# Patient Record
Sex: Female | Born: 1945
Health system: Southern US, Community
[De-identification: ages and names within clinical notes are randomized; demographics above are authoritative.]

## PROBLEM LIST (undated history)

## (undated) DIAGNOSIS — J449 Chronic obstructive pulmonary disease, unspecified: Secondary | ICD-10-CM

## (undated) HISTORY — PX: BACK SURGERY: SHX140

## (undated) HISTORY — PX: BREAST SURGERY: SHX581

## (undated) HISTORY — PX: HAND SURGERY: SHX662

## (undated) HISTORY — PX: AUGMENTATION MAMMAPLASTY: SUR837

## (undated) HISTORY — PX: ABDOMINAL HYSTERECTOMY: SHX81

## (undated) HISTORY — PX: CATARACT EXTRACTION: SUR2

## (undated) HISTORY — PX: BREAST ENHANCEMENT SURGERY: SHX7

## (undated) HISTORY — PX: BREAST EXCISIONAL BIOPSY: SUR124

---

## 1998-11-14 ENCOUNTER — Other Ambulatory Visit: Admission: RE | Admit: 1998-11-14 | Discharge: 1998-11-14 | Payer: Self-pay | Admitting: Family Medicine

## 1999-05-27 ENCOUNTER — Encounter: Admission: RE | Admit: 1999-05-27 | Discharge: 1999-05-27 | Payer: Self-pay | Admitting: Family Medicine

## 1999-05-27 ENCOUNTER — Encounter: Payer: Self-pay | Admitting: Family Medicine

## 1999-12-03 ENCOUNTER — Encounter: Admission: RE | Admit: 1999-12-03 | Discharge: 1999-12-03 | Payer: Self-pay | Admitting: Family Medicine

## 1999-12-03 ENCOUNTER — Encounter: Payer: Self-pay | Admitting: Family Medicine

## 1999-12-26 ENCOUNTER — Ambulatory Visit (HOSPITAL_COMMUNITY): Admission: RE | Admit: 1999-12-26 | Discharge: 1999-12-27 | Payer: Self-pay | Admitting: Neurosurgery

## 2000-01-10 ENCOUNTER — Encounter: Admission: RE | Admit: 2000-01-10 | Discharge: 2000-01-10 | Payer: Self-pay | Admitting: Neurosurgery

## 2000-03-10 ENCOUNTER — Encounter: Admission: RE | Admit: 2000-03-10 | Discharge: 2000-03-10 | Payer: Self-pay | Admitting: Neurosurgery

## 2000-03-15 ENCOUNTER — Ambulatory Visit (HOSPITAL_COMMUNITY): Admission: RE | Admit: 2000-03-15 | Discharge: 2000-03-15 | Payer: Self-pay | Admitting: Neurosurgery

## 2000-05-12 HISTORY — PX: NECK SURGERY: SHX720

## 2000-06-02 ENCOUNTER — Encounter: Admission: RE | Admit: 2000-06-02 | Discharge: 2000-06-02 | Payer: Self-pay | Admitting: Neurosurgery

## 2000-06-19 ENCOUNTER — Encounter: Admission: RE | Admit: 2000-06-19 | Discharge: 2000-06-19 | Payer: Self-pay | Admitting: Family Medicine

## 2000-06-19 ENCOUNTER — Encounter: Payer: Self-pay | Admitting: Family Medicine

## 2000-08-04 ENCOUNTER — Encounter: Admission: RE | Admit: 2000-08-04 | Discharge: 2000-08-04 | Payer: Self-pay | Admitting: Neurosurgery

## 2000-10-30 ENCOUNTER — Encounter: Admission: RE | Admit: 2000-10-30 | Discharge: 2000-10-30 | Payer: Self-pay | Admitting: Neurosurgery

## 2000-11-26 ENCOUNTER — Other Ambulatory Visit: Admission: RE | Admit: 2000-11-26 | Discharge: 2000-11-26 | Payer: Self-pay | Admitting: Family Medicine

## 2001-05-07 ENCOUNTER — Emergency Department (HOSPITAL_COMMUNITY): Admission: EM | Admit: 2001-05-07 | Discharge: 2001-05-07 | Payer: Self-pay | Admitting: Emergency Medicine

## 2001-10-22 ENCOUNTER — Encounter: Payer: Self-pay | Admitting: Family Medicine

## 2001-10-22 ENCOUNTER — Encounter: Admission: RE | Admit: 2001-10-22 | Discharge: 2001-10-22 | Payer: Self-pay | Admitting: Family Medicine

## 2002-11-01 ENCOUNTER — Encounter: Admission: RE | Admit: 2002-11-01 | Discharge: 2002-11-01 | Payer: Self-pay | Admitting: Family Medicine

## 2002-11-01 ENCOUNTER — Encounter: Payer: Self-pay | Admitting: Family Medicine

## 2003-06-16 ENCOUNTER — Encounter: Admission: RE | Admit: 2003-06-16 | Discharge: 2003-06-16 | Payer: Self-pay | Admitting: Family Medicine

## 2003-06-29 ENCOUNTER — Encounter: Admission: RE | Admit: 2003-06-29 | Discharge: 2003-06-29 | Payer: Self-pay | Admitting: Orthopedic Surgery

## 2003-09-13 ENCOUNTER — Ambulatory Visit (HOSPITAL_BASED_OUTPATIENT_CLINIC_OR_DEPARTMENT_OTHER): Admission: RE | Admit: 2003-09-13 | Discharge: 2003-09-13 | Payer: Self-pay | Admitting: Orthopedic Surgery

## 2004-11-28 ENCOUNTER — Ambulatory Visit (HOSPITAL_BASED_OUTPATIENT_CLINIC_OR_DEPARTMENT_OTHER): Admission: RE | Admit: 2004-11-28 | Discharge: 2004-11-28 | Payer: Self-pay | Admitting: Orthopedic Surgery

## 2004-11-28 ENCOUNTER — Ambulatory Visit (HOSPITAL_COMMUNITY): Admission: RE | Admit: 2004-11-28 | Discharge: 2004-11-28 | Payer: Self-pay | Admitting: Orthopedic Surgery

## 2004-12-17 ENCOUNTER — Encounter: Payer: Self-pay | Admitting: Family Medicine

## 2006-09-23 ENCOUNTER — Telehealth: Payer: Self-pay | Admitting: Family Medicine

## 2006-09-23 ENCOUNTER — Ambulatory Visit: Payer: Self-pay | Admitting: Family Medicine

## 2006-09-23 DIAGNOSIS — G4700 Insomnia, unspecified: Secondary | ICD-10-CM | POA: Insufficient documentation

## 2006-09-23 DIAGNOSIS — F329 Major depressive disorder, single episode, unspecified: Secondary | ICD-10-CM

## 2006-09-23 DIAGNOSIS — E785 Hyperlipidemia, unspecified: Secondary | ICD-10-CM

## 2006-09-23 LAB — CONVERTED CEMR LAB
ALT: 15 units/L (ref 0–35)
Alkaline Phosphatase: 94 units/L (ref 39–117)
Chloride: 105 meq/L (ref 96–112)
Creatinine, Ser: 1.32 mg/dL — ABNORMAL HIGH (ref 0.40–1.20)
Glucose, Bld: 75 mg/dL (ref 70–99)
LDL Cholesterol: 134 mg/dL — ABNORMAL HIGH (ref 0–99)
Total Bilirubin: 0.7 mg/dL (ref 0.3–1.2)
Total Protein: 7.9 g/dL (ref 6.0–8.3)
Triglycerides: 134 mg/dL (ref <150)

## 2006-09-24 ENCOUNTER — Encounter: Payer: Self-pay | Admitting: Family Medicine

## 2006-09-30 ENCOUNTER — Encounter: Admission: RE | Admit: 2006-09-30 | Discharge: 2006-09-30 | Payer: Self-pay | Admitting: Family Medicine

## 2007-01-15 ENCOUNTER — Ambulatory Visit: Payer: Self-pay | Admitting: Family Medicine

## 2007-01-15 DIAGNOSIS — J069 Acute upper respiratory infection, unspecified: Secondary | ICD-10-CM | POA: Insufficient documentation

## 2007-03-31 ENCOUNTER — Ambulatory Visit: Payer: Self-pay | Admitting: Family Medicine

## 2007-09-02 ENCOUNTER — Ambulatory Visit: Payer: Self-pay | Admitting: Family Medicine

## 2007-09-02 ENCOUNTER — Encounter: Admission: RE | Admit: 2007-09-02 | Discharge: 2007-09-02 | Payer: Self-pay | Admitting: Family Medicine

## 2007-09-02 DIAGNOSIS — R944 Abnormal results of kidney function studies: Secondary | ICD-10-CM | POA: Insufficient documentation

## 2007-09-02 DIAGNOSIS — N951 Menopausal and female climacteric states: Secondary | ICD-10-CM | POA: Insufficient documentation

## 2007-09-02 LAB — CONVERTED CEMR LAB: Cholesterol, target level: 200 mg/dL

## 2007-09-03 ENCOUNTER — Encounter: Payer: Self-pay | Admitting: Family Medicine

## 2007-09-03 LAB — CONVERTED CEMR LAB
ALT: 17 units/L (ref 0–35)
Albumin: 4.5 g/dL (ref 3.5–5.2)
CO2: 22 meq/L (ref 19–32)
Calcium: 9.9 mg/dL (ref 8.4–10.5)
Chloride: 105 meq/L (ref 96–112)
Creatinine, Ser: 1.09 mg/dL (ref 0.40–1.20)
Glucose, Bld: 94 mg/dL (ref 70–99)
HDL: 43 mg/dL (ref >39)
LDL Cholesterol: 115 mg/dL — ABNORMAL HIGH (ref 0–99)
Potassium: 4.7 meq/L (ref 3.5–5.3)
Sodium: 141 meq/L (ref 135–145)
TSH: 1.61 microintl units/mL (ref 0.350–5.50)
Total Protein: 7.3 g/dL (ref 6.0–8.3)

## 2008-02-24 ENCOUNTER — Ambulatory Visit: Payer: Self-pay | Admitting: Family Medicine

## 2008-08-30 ENCOUNTER — Ambulatory Visit: Payer: Self-pay | Admitting: Family Medicine

## 2008-10-25 ENCOUNTER — Telehealth: Payer: Self-pay | Admitting: Family Medicine

## 2008-10-25 ENCOUNTER — Encounter: Payer: Self-pay | Admitting: Family Medicine

## 2008-10-26 DIAGNOSIS — N183 Chronic kidney disease, stage 3 (moderate): Secondary | ICD-10-CM

## 2008-10-26 LAB — CONVERTED CEMR LAB
ALT: 11 units/L (ref 0–35)
AST: 13 units/L (ref 0–37)
Alkaline Phosphatase: 98 units/L (ref 39–117)
BUN: 15 mg/dL (ref 6–23)
CO2: 20 meq/L (ref 19–32)
Chloride: 108 meq/L (ref 96–112)
Creatinine, Ser: 1.21 mg/dL — ABNORMAL HIGH (ref 0.40–1.20)
HDL: 47 mg/dL (ref >39)
LDL Cholesterol: 123 mg/dL — ABNORMAL HIGH (ref 0–99)
Potassium: 4.2 meq/L (ref 3.5–5.3)
Sodium: 141 meq/L (ref 135–145)
TSH: 1.505 microintl units/mL (ref 0.350–4.500)
Total Bilirubin: 0.5 mg/dL (ref 0.3–1.2)
Total CHOL/HDL Ratio: 4.4
Total Protein: 7.2 g/dL (ref 6.0–8.3)
VLDL: 37 mg/dL (ref 0–40)

## 2008-10-27 ENCOUNTER — Encounter: Admission: RE | Admit: 2008-10-27 | Discharge: 2008-10-27 | Payer: Self-pay | Admitting: Family Medicine

## 2008-11-30 ENCOUNTER — Telehealth: Payer: Self-pay | Admitting: Family Medicine

## 2008-12-11 ENCOUNTER — Encounter: Admission: RE | Admit: 2008-12-11 | Discharge: 2008-12-11 | Payer: Self-pay | Admitting: Family Medicine

## 2009-05-18 ENCOUNTER — Ambulatory Visit: Payer: Self-pay | Admitting: Family Medicine

## 2009-12-03 ENCOUNTER — Ambulatory Visit: Payer: Self-pay | Admitting: Family Medicine

## 2009-12-03 LAB — CONVERTED CEMR LAB
Albumin: 4.8 g/dL (ref 3.5–5.2)
Creatinine, Ser: 1.31 mg/dL — ABNORMAL HIGH (ref 0.40–1.20)
Potassium: 5.1 meq/L (ref 3.5–5.3)
Total Bilirubin: 0.7 mg/dL (ref 0.3–1.2)
Triglycerides: 164 mg/dL — ABNORMAL HIGH (ref <150)
VLDL: 33 mg/dL (ref 0–40)

## 2009-12-07 ENCOUNTER — Encounter: Admission: RE | Admit: 2009-12-07 | Discharge: 2009-12-07 | Payer: Self-pay | Admitting: Family Medicine

## 2009-12-18 ENCOUNTER — Encounter: Admission: RE | Admit: 2009-12-18 | Discharge: 2009-12-18 | Payer: Self-pay | Admitting: Family Medicine

## 2009-12-20 LAB — HM MAMMOGRAPHY: HM Mammogram: NEGATIVE

## 2010-02-21 ENCOUNTER — Ambulatory Visit: Payer: Self-pay | Admitting: Family Medicine

## 2010-06-13 NOTE — Assessment & Plan Note (Signed)
Summary: f/u on meds   Vital Signs:  Patient profile:   65 year old female Height:      62 inches Weight:      140 pounds BMI:     25.70 Pulse rate:   76 / minute BP sitting:   91 / 58  (left arm) Cuff size:   regular  Vitals Entered By: Avon Gully CMA, Duncan Dull) (December 03, 2009 8:20 AM) CC: f/u meds;labs   Primary Care Provider:  Linford Arnold, C  CC:  f/u meds;labs.  History of Present Illness: Would like to continue the wellbutrin.  Would like it written two times a day for insurance and cost reasons. Otherwise she is doing well on it. No side effects.  Says tried to callto get this straightened out but didn't get a response. She is due for lipid labs as well.  Taking her statin regularly and tolerating it well with no myalgias.   Current Medications (verified): 1)  Pravastatin Sodium 80 Mg Tabs (Pravastatin Sodium) .... Take 1 Tablet By Mouth Once A Day At Bedtime 2)  Budeprion Xl 300 Mg Xr24h-Tab (Bupropion Hcl) .... Take 1 Tablet By Mouth Once A Day 3)  Aspirin 325 Mg Tabs (Aspirin) .... Take One Tablet By Mouth Daily 4)  Prilosec 20 Mg Cpdr (Omeprazole) .... Take One Tablet By Mouth Once A Day 5)  Tylenol Pm Extra Strength 500-25 Mg Tabs (Diphenhydramine-Apap (Sleep)) .... Take 2 Tablets [place] At Bedtime 6)  Calcium 500/d 500-200 Mg-Unit  Tabs (Calcium Carbonate-Vitamin D) .... Take 1 Tablet By Mouth Two Times A Day  Allergies (verified): No Known Drug Allergies  Comments:  Nurse/Medical Assistant: The patient's medications and allergies were reviewed with the patient and were updated in the Medication and Allergy Lists. Avon Gully CMA, Duncan Dull) (December 03, 2009 8:23 AM)  Physical Exam  General:  Well-developed,well-nourished,in no acute distress; alert,appropriate and cooperative throughout examination Head:  Normocephalic and atraumatic without obvious abnormalities. No apparent alopecia or balding. Neck:  No deformities, masses, or tenderness noted. NO  thryomegaly.  Lungs:  Normal respiratory effort, chest expands symmetrically. Lungs are clear to auscultation, no crackles or wheezes. Heart:  Normal rate and regular rhythm. S1 and S2 normal without gallop, murmur, click, rub or other extra sounds.  no carotid bruits.  Skin:  no rashes.   Cervical Nodes:  No lymphadenopathy noted Psych:  Cognition and judgment appear intact. Alert and cooperative with normal attention span and concentration. No apparent delusions, illusions, hallucinations   Impression & Recommendations:  Problem # 1:  HYPERLIPIDEMIA NEC/NOS (ICD-272.4) Doing well. Due to recheck lipids to make sure at goal.  Will refill. Continue to monitor diet and encourage regular activity.  Her updated medication list for this problem includes:    Pravastatin Sodium 80 Mg Tabs (Pravastatin sodium) .Marland Kitchen... Take 1 tablet by mouth once a day at bedtime  Orders: T-Comprehensive Metabolic Panel [id number]) T-Lipid Profile ([id number])  Problem # 2:  CHRONIC KIDNEY DISEASE STAGE III (MODERATE) (ICD-585.3) Need to f/u right cyst. Will schedule for an [place]. and check Cr today.   Problem # 3:  DISORDER, DEPRESSIVE NEC (ICD-311) Will  change to two times a day for cost reasons but she is actually taking once a day and is well controlled.  Her updated medication list for this problem includes:    Budeprion Xl 300 Mg Xr24h-tab (Bupropion hcl) .Marland Kitchen... Take 1 tablet by mouth two times a day  Complete Medication List: 1)  Pravastatin Sodium 80 Mg Tabs (Pravastatin  sodium) .... Take 1 tablet by mouth once a day at bedtime 2)  Budeprion Xl 300 Mg Xr24h-tab (Bupropion hcl) .... Take 1 tablet by mouth two times a day 3)  Aspirin 325 Mg Tabs (Aspirin) .... Take one tablet by mouth daily 4)  Prilosec 20 Mg Cpdr (Omeprazole) .... Take one tablet by mouth once a day 5)  Tylenol Pm Extra Strength 500-25 Mg Tabs (Diphenhydramine-apap (sleep)) .... Take 2 tablets bymouth at bedtime 6)  Calcium 500/d  500-200 Mg-unit Tabs (Calcium carbonate-vitamin d) .... Take 1 tablet by mouth two times a day  Other Orders: T-*Unlisted Diagnostic X-ray test/procedure (61607)  Patient Instructions: 1)  Please schedule a follow-up appointment in 1 year.  2)  We will schedule your kidney ultrasound and call you with the appointment.  Prescriptions: PRAVASTATIN SODIUM 80 MG TABS (PRAVASTATIN SODIUM) Take 1 tablet by mouth once a day at bedtime  #90 x 3   Entered and Authorized by:   Nani Gasser MD   Signed by:   Nani Gasser MD on 12/03/2009   Method used:   Printed then faxed to ...       [facility] (retail)       [address]       [place], [place]  [id number]       Ph: [phone]       Fax: [phone]   RxID:   [id number] BUDEPRION XL 300 MG XR24H-TAB (BUPROPION HCL) Take 1 tablet by mouth two times a day  #180 x 3   Entered and Authorized by:   Nani Gasser MD   Signed by:   Nani Gasser MD on 12/03/2009   Method used:   Printed then faxed to ...       [facility] (retail)       [address]       [place], [place]  [address]       Ph: [phone]       Fax: [phone]   RxID:   [id number]

## 2010-06-13 NOTE — Assessment & Plan Note (Signed)
Summary: flu shot  Nurse Visit   Allergies: No Known Drug Allergies  Immunizations Administered:  Influenza Vaccine # 1:    Vaccine Type: Fluvax MCR    Site: left deltoid    Mfr: GlaxoSmithKline    Dose: 0.5 ml    Route: IM    Given by: Sue Lush McCrimmon CMA, (AAMA)    Exp. Date: 11/09/2010    Lot #: [id number]    VIS given: 12/04/09 version given February 21, 2010.  Flu Vaccine Consent Questions:    Do you have a history of severe allergic reactions to this vaccine? no    Any prior history of allergic reactions to egg and/or gelatin? no    Do you have a sensitivity to the preservative Thimersol? no    Do you have a past history of Guillan-Barre Syndrome? no    Do you currently have an acute febrile illness? no    Have you ever had a severe reaction to latex? no    Vaccine information given and explained to patient? no    Are you currently pregnant? no  Orders Added: 1)  Influenza Vaccine MCR [00025]

## 2010-06-13 NOTE — Letter (Signed)
Summary: [facility]   Imported By: Maryln Gottron 11/01/2009 10:17:47  _____________________________________________________________________  External Attachment:    Type:   Image     Comment:   External Document

## 2010-06-13 NOTE — Assessment & Plan Note (Signed)
Summary: FLU SHOT  Nurse Visit   Vitals Entered By: Payton Spark CMA (May 18, 2009 1:44 PM)  Allergies: No Known Drug Allergies  Orders Added: 1)  Flu Vaccine 75yrs + [90658] 2)  Administration Flu vaccine - MCR [G0008] Flu Vaccine Consent Questions     Do you have a history of severe allergic reactions to this vaccine? no    Any prior history of allergic reactions to egg and/or gelatin? no    Do you have a sensitivity to the preservative Thimersol? no    Do you have a past history of Guillan-Barre Syndrome? no    Do you currently have an acute febrile illness? no    Have you ever had a severe reaction to latex? no    Vaccine information given and explained to patient? yes    Are you currently pregnant? no    Lot Number:[id number]   Exp Date:11/08/2009   Site Given  Left Deltoid IMmedflu

## 2010-09-27 NOTE — Op Note (Signed)
Victoria Benjamin, Victoria Benjamin                       ACCOUNT NO.:  [id number]   MEDICAL RECORD NO.:  [id number]                   PATIENT TYPE:  AMB   LOCATION:  DSC                                  FACILITY:  [facility]   PHYSICIAN:  Cindee Salt, M.D.                    DATE OF BIRTH:  [birth year]   DATE OF PROCEDURE:  09/13/2003  DATE OF DISCHARGE:                                 OPERATIVE REPORT   PREOPERATIVE DIAGNOSIS:  Carpal tunnel syndrome, ulnar nerve palsy, right  arm.   POSTOPERATIVE DIAGNOSIS:  Carpal tunnel syndrome, ulnar nerve palsy, right  arm.   OPERATION:  Decompression right median nerve, subcutaneous transposition  right ulnar nerve.   SURGEON:  Cindee Salt, M.D.   ASSISTANTCarolyne Fiscal   ANESTHESIA:  General.   HISTORY:  The patient is a 65 year old female with a history of carpal  tunnel syndrome and changes in the ulnar nerve at her elbow.  This has not  responded to conservative treatment.   DESCRIPTION OF PROCEDURE:  The patient was brought to the operating room  where a general anesthetic was carried out without difficulty.  She was  prepped and draped using DuraPrep in a supine position with the right arm  free.  A longitudinal incision was made in the palm after exsanguination of  the limb and elevation of a tourniquet to 250 mmHg.  Exsanguination was with  an Esmarch bandage.  The longitudinal incision was then deepened.  The  palmar fascia was split.  The superficial palmar arch was identified.  The  flexor tendon to the ring and little finger was identified.  To the ulnar  side of the median nerve, the carpal retinaculum was incised with sharp  dissection.  A right angle and and Sewell retractor were placed between the  skin and forearm fascia.  The fascia was released for approximately 3 cm  proximal to the wrist crease under direct vision.  The canal was explored.  No further lesions were identified.  The tenosynovial tissue was thickened  and a hyperemic  area to the nerve was identified.  The wound was irrigated.  The skin was closed with interrupted 5-0 nylon sutures.  A separate incision  was then made over the medial condyle and carried down through the  subcutaneous tissue.  The bleeders were again electrocauterized.  Dissection  was carried down to the medial epicondyle, the medial and the brachial  cutaneous nerve.  The brachial cutaneous nerve was identified and protected.  The ulnar nerve was identified with blunt and sharp dissection, this was  dissected free up to the arcade of Struthers.  The intermuscular septum was  then released from the humerus, left attached to the most medial aspect of  the medial epicondyle.  A fasciotomy was then performed to the flexor carpi  ulnaris and a portion of fascia was then elevated leaving it attached to the  epicondyle.  The ulnar nerve was then transposed anteriorly protecting the  branches and maintaining its vascular structure.  This was then placed  anterior to the epicondyle, the wound irrigated, and the two slings were  then sutured into position to maintain it anterior to the epicondyle.  The  suturing was done with figure-of-eight 4-0 Vicryl sutures.  The subcutaneous  tissue was then closed with interrupted 4-0 Vicryl and the skin with a  subcuticular 4-0 Monocryl suture.  Steri-Strips were applied.  A sterile  compressive dressing and long arm splint were applied.  On flexion and  extension prior to closure, there was no kinking of the nerve and no areas  of compression.  The patient was taken to the recovery room for observation  in satisfactory condition.  She is discharged home to return to the [facility] in one week on Talwin and Keflex.                                               Cindee Salt, M.D.    [name]  D:  09/13/2003  T:  09/13/2003  Job:  [id number]

## 2010-09-27 NOTE — Op Note (Signed)
[place]. [facility]  Patient:    Victoria Benjamin, Victoria Benjamin                    MRN: [id number] Proc. Date: 12/26/99 Adm. Date:  [id number] Attending:  Tressie Stalker D                           Operative Report  BRIEF HISTORY:  The patient is 65 year old white female who suffers from neck and left greater, than right arm pain.  Failed medical management.  She was worked up as an outpatient with a cervical MI that demonstrated C5-6 and C6-7 degenerative disease with herniated nucleus pulposus, spondylosis, spinal stenosis.  The patient therefore weighed the risks, benefits, alternatives to surgery and decided to proceed with a 2 level anterior cervical diskectomy, fusion and plating.  PREOPERATIVE DIAGNOSES: 1. C5-6 and C6-7 degenerative disease. 2. Herniated nucleus pulposus. 3. Spondylosis. 4. Spinal stenosis.  POSTOPERATIVE DIAGNOSIS: 1. C5-6 and C6-7 degenerative disease. 2. Herniated nucleus pulposus. 3. Spondylosis. 4. Spinal stenosis.  PROCEDURE: 1. C5-6 and C6-7 anterior cervical diskectomy. 2. Interbody iliac crest allograft arthrodesis. 3. Anterior cervical plating C5 to C7 using Codman anterior cervical plate    and a 12 mm screw.  SURGEON:  Cristi Loron, M.D.  ASSISTANT:  Stefani Dama, M.D.  ANESTHESIA:  General endotracheal.  ESTIMATED BLOOD LOSS:  200 cc.  SPECIMENS:  None.  DRAINS:  None.  COMPLICATIONS:  None.  DESCRIPTION OF PROCEDURE:  The patient was brought to the operating room by the anesthesia team.  General endotracheal anesthesia was induced.  The patient remained in the supine position.  A roll was placed under her shoulders to place her neck in slight extension.  Her anterior cervical region was then prepared with Betadine scrub and Betadine solution.  Sterile drapes were applied.  I then injected the area to be incised with Marcaine with epinephrine solution.  I used the scalpel to make a left-sided  transverse incision in one of the patients preexisting skin folds.  I then used Metzenbaum scissors to dissect down to the platysma muscle.  I divided along the direction of skin incision and then dissected medial to the sternocleidomastoid muscle, jugular vein and carotid artery.  I dissected bluntly down to the anterior cervical spine, identified the esophagus, and carefully retracted medially.  I cleared the soft tissue from the anterior cervical spine using Kitner swabs and inserted a bent spinal needle into the exposed interspace and obtained an intraoperative radiograph to confirm my location.  I then used electrocautery to detach the longus coli muscle bilaterally from the C5-6 and C6-7 intervertebral disk space.  I inserted the Caspar self-retaining retractor for exposure.  I then used the 15 blade scalpel to incise the C5-6 intervertebral disc.  I performed a partial discectomy using the pituitary forceps.  I inserted distraction screws at C5 and C6 to distract the interspace.  I then used a Midas Rex high speed drill to drill away the remainder of the intervertebral disk and decorticate the vertebral end plates at J4-7.  Posteriorly there was a very large osteophyte coming from the colloids of the C5 vertebral body on the left compressing the left C6 nerve root.  I drilled away with a drill.  I thinned out the posterior longitudinal ligament, incised it with an arachnoid knife, and then removed it with the Kerrison punch under______  the vertebral end plates at W2-9 and  identified the bilateral C6 nerve root, and performed a generous foraminotomy about it bilaterally.  I then palpated about the thecal sac and the bilateral C6 nerve root and noted that neural structures were well decompressed.  I then repeated this procedure at C6-7 i.e. I incised the C6-7 disk and performed a partial diskectomy with pituitary forceps.  Put a distraction screw in C7, distracted the C6-7  interspace, and then decorticated the vertebral end plates with a Midas Rex high speed drill throwing away the remainder of the intervertebral disk.  I thinned out the posterior longitudinal ligament, incised it with an arachnoid knife, removed it with a Kerrison punch under______ the vertebral end plates of A2-1 and performed a generous foraminotomy bilaterally about the C7 nerve root.  The neural structures were well decompressed at this level.  Having completed the anterior cervical diskectomy I now turned my attention to arthrodesis.  I obtained the iliac crest tricortical allograft bone grafts and fashioned them to its approximate dimensions, 7 mm in height and 1 cm in depth.  I then inserted one bone graft into the distracted C6-7 interspace and then another in the C5-6 interspace.  I removed distraction pins.  There was a good snug fit of the bone graft at both levels.  I drilled away some anterior osteophytes from the anterior aspect of the C5, C6, and C7 vertebral bodies.  Having completed arthrodesis I now turned my attention to anterior spinal instrumentation.  I obtained an appropriate length Codman anterior cervical plate, laid it along the anterior aspect of the vertebral bodies from C5 to C7, drilled two holes at C5, two at C6, and two at C7, tapped these holes, and placed 12 mm screws.  I then obtained an intraoperative radiograph.  It demonstrated good position of the plates, screws, and interbody grafts.  I then secured the screws to the plate using the CAM tightner.  I then copiously irrigated the wound with Bacitracin solution, removed the solution.  Achieved stringent hemostasis with bipolar electrocautery and Gelfoam.  Removed the Caspar self-retaining retractor and inspected the esophagus for any damage and there was none.  I then reapproximated the patients platysma muscle with interrupted 3-0 Vicryl and the subcutaneous with interrupted 3-0 Vicryl and the skin  with Steri-Strips and benzoin.  The wound was then coated with bacitracin ointment.  A sterile dressing applied.  The drapes were removed and  the patient was subsequently extubated by the anesthesia team and transported to the post anesthesia care unit in stable condition.  All sponge, instrument, and needle counts were correct at the end of this case. DD:  12/26/99 TD:  12/27/99 Job: [id number]

## 2010-09-27 NOTE — Op Note (Signed)
[place]. [facility]  Patient:    Victoria Benjamin, Victoria Benjamin                    MRN: [id number] Proc. Date: 12/26/99 Adm. Date:  [id number] Attending:  Tressie Stalker D                           Operative Report  ADDENDUM:  I forgot to mention that I did coagulate one small artery which was crossing across the C6-7 intervertebral disk space, and secured it with two Hemoclips, and then ligated it with the Metzenbaum scissors in order to gain access to the C6-7 intervertebral disk space. DD:  12/26/99 TD:  12/27/99 Job: [id number]

## 2010-09-27 NOTE — H&P (Signed)
[place]. [facility]  Patient:    Victoria Benjamin, PACIFICO                    MRN: [id number] Adm. Date:  [id number] Attending:  Tressie Stalker D                         History and Physical  CHIEF COMPLAINT:  Neck pain, left greater than right arm pain.  HISTORY OF PRESENT ILLNESS:  The patient is a 65 year old white female, who had had an approximately three-month history of neck pain and bilateral arm pain.  She has been treated with various medications by her primary physician, and she has failed to improve.  She then underwent physical therapy and this did not help either.  She was sent for a cervical MRI and then kindly sent for my consultation.  The patient complains of left-sided neck pain which radiates down the left shoulder, down her left arm, with similar pain on the right.  PAST MEDICAL HISTORY:  Positive for endometriosis.  MEDICATIONS:  Pravachol, Estratest, vitamin E, propoxyphene, Celebrex, cyclobenzaprine.  PAST SURGICAL HISTORY:  Hysterectomy in 1984 for endometriosis, breast implants in 1986 and 1992.  FAMILY HISTORY:  The patients mother is age 39, in good health.  The patients father died at age 62 secondary to alcohol abuse and myocardial infarction.  SOCIAL HISTORY:  The patient is married.  She has two daughters.  She lives in [place].  She is employed in Systems [employer].  She has been out of work since November 20, 1999, secondary to her neck and arm pain.  She rarely drinks alcohol, and she denies drug use.  She had been smoking three-quarters of a pack a day of cigarettes for approximately 35 years.  I highly advised her to quit.  REVIEW OF SYSTEMS:  Negative except as above.  PHYSICAL EXAMINATION:  GENERAL:  A pleasant well-nourished, well-developed 65 year old white female complaining of neck and left arm pain.  VITAL SIGNS:  Height 5 feet 2 inches.  Weight 120 pounds.  HEENT:  Normocephalic, atraumatic.  Pupils are equal,  round and reactive to light, extraocular muscles intact.  Sclerae are white.  Conjunctivae are pink. Oropharynx benign.  Uvula midline.  NECK:  Supple.  There are no masses, meningismus, or deformity, tracheal deviation, _____, or carotid bruits.  She has a limited cervical range of motion.  Spurlings test is positive on the left, negative on the right. Lhermittes sign was not present.  CHEST:  Thorax was symmetrical.  LUNGS:  Clear to auscultation.  HEART:  Regular rate and rhythm.  ABDOMEN:  Soft, nontender.  EXTREMITIES:  No obvious deformities.  The patient has a positive Phalens test bilaterally, equivocal Tinels test.  BACK:  Benign.  NEUROLOGIC:  The patient is alert and oriented x 3.  Cranial nerves II-XII are grossly intact bilaterally.  Vision is grossly normal bilaterally with corrective lenses.  Hearing bilaterally grossly normal.  Motor strength is 5/5 in her bilateral deltoid, biceps, triceps, hand grip, wrist extensor, interosseous, psoas, quadriceps, gastrocnemius, extensor hallucis longus. Cerebellar exam is intact to rapid alternating movements in the upper extremities bilaterally.  Sensory exam is somewhat inconsistent, but she appears to have decreased sensation in the radial aspect of her bilateral hands, thumb, and index finger.  Deep tendon reflexes are 2/4 in bilateral biceps, triceps, brachioradialis, quadriceps, gastrocnemius.  She has bilateral flexor plantar reflexes, no ankle clonus.  IMAGING STUDIES:  The  patient had a cervical MRI performed at [facility] on December 03, 1999.  The patient has degenerative disease and spondylosis, herniated nucleus pulposus at C5-6 on the left with compression of the left C6 nerve root.  The patient has diffuse bulging of the _____ at C6-7 with left neural foraminal stenosis.  ASSESSMENT AND PLAN:  C5-6 and C6-7 degenerative disease, herniated nucleus pulposus, spondylosis, spinal stenosis.  I discussed the situation with  the patient and reviewed her MRI scan with her and pointed out the abnormalities. I have discussed the risks and treatment options, including a C5-6 and C6-7 anterior cervical diskectomy and fusion and plating.  I have described the surgical procedure to her and discussed the risk of surgery extensively.  The patient has weighed the risks, benefits, and alternatives of surgery and desires to proceed with the operation on December 26, 1999. DD:  12/26/99 TD:  12/27/99 Job: [id number]

## 2010-09-27 NOTE — Op Note (Signed)
Victoria Benjamin, Victoria Benjamin             ACCOUNT NO.:  [id number]   MEDICAL RECORD NO.:  [id number]          PATIENT TYPE:  AMB   LOCATION:  DSC                          FACILITY:  [facility]   PHYSICIAN:  Cindee Salt, M.D.       DATE OF BIRTH:  October 12, 1945   DATE OF PROCEDURE:  11/28/2004  DATE OF DISCHARGE:                                 OPERATIVE REPORT   PREOPERATIVE DIAGNOSIS:  Stenosing tenosynovitis, right thumb.   POSTOPERATIVE DIAGNOSIS:  Stenosing tenosynovitis, right thumb.   OPERATION:  Release A1 pulley, right thumb.   SURGEON:  Cindee Salt, M.D.   ASSISTANT:  Carolyne Fiscal R.N.   ANESTHESIA:  Forearm based IV regional.   HISTORY:  The patient is a 65 year old female with a history of a locked  trigger thumb, right thumb. This has not responded to conservative  treatment.   PROCEDURE:  The patient is brought to the operating room where forearm based  IV regional anesthetic was carried out without difficulty. She was prepped  using DuraPrep, supine position, right arm free. A transverse incision was  made over the A1 pulley, carried down through subcutaneous tissue. Bleeders  were electrocauterized. Neurovascular structures identified and protected.  Retractors placed. The A1 pulley was found be markedly thickened. This was  isolated. An incision was made through the A1 pulley on the radial aspect  protecting the oblique pulley.  The thumb was easily able to be  unlocked at  that point, placed through full range motion, no further triggering was  evident. The wound was irrigated. Skin was closed with interrupted 5-0 nylon  sutures. A sterile compressive dressing was applied. The patient tolerated  the procedure well was taken to the recovery observation in satisfactory  condition. She is discharged home to return to the [facility]  in one week on [place] [facility].       GK/MEDQ  D:  11/28/2004  T:  11/28/2004  Job:  [id number]

## 2010-10-15 ENCOUNTER — Telehealth: Payer: Self-pay | Admitting: Family Medicine

## 2010-10-15 ENCOUNTER — Ambulatory Visit (INDEPENDENT_AMBULATORY_CARE_PROVIDER_SITE_OTHER): Payer: Medicare Other | Admitting: Family Medicine

## 2010-10-15 VITALS — BP 118/78 | HR 105 | Temp 98.8°F | Ht 61.5 in | Wt 142.0 lb

## 2010-10-15 DIAGNOSIS — N760 Acute vaginitis: Secondary | ICD-10-CM

## 2010-10-15 DIAGNOSIS — R109 Unspecified abdominal pain: Secondary | ICD-10-CM

## 2010-10-15 LAB — WET PREP FOR TRICH, YEAST, CLUE: Yeast Wet Prep HPF POC: NONE SEEN

## 2010-10-15 LAB — POCT URINALYSIS DIPSTICK
Bilirubin, UA: NEGATIVE
Ketones, UA: NEGATIVE
Spec Grav, UA: 1.01
Urobilinogen, UA: 0.2
pH, UA: 7

## 2010-10-15 NOTE — Progress Notes (Signed)
  Subjective:    Patient ID: Victoria Benjamin, female    DOB: 05/21/1945, 65 y.o.   MRN: [id number]  HPI She has a raw feeling in her vaginal area. Sxs for 2 weeks.  No abnormal d/c, no fever. No pelvic pressure.  No inc in urinary frequency. No rash. +dysuria.  No creams or douches or powders. Doesn't use condoms. Not sexually active. No nausea or vomiting. No bleeding.    Review of Systems     Objective:   Physical Exam  Constitutional: She appears well-developed and well-nourished.   Pt declined pelvic exam.       Assessment & Plan:  Vaginitis - Will send wet prep.  UA is neg today. Will send urine culture.  She declined pelvic exam. If wet prep neg then will need exam.

## 2010-10-15 NOTE — Patient Instructions (Signed)
We will call you with the lab results. 

## 2010-10-15 NOTE — Telephone Encounter (Signed)
Call patient: Wet prep is negative. She continues to have symptoms then we really need to do a actual vaginal exam.

## 2010-10-17 ENCOUNTER — Telehealth: Payer: Self-pay | Admitting: Family Medicine

## 2010-10-17 NOTE — Telephone Encounter (Signed)
Call pt: Urine culture is negative. Wet prep is negative. If She continues to have symptoms then we really need to do a actual vaginal exam

## 2010-10-17 NOTE — Telephone Encounter (Signed)
Pt aware of the above  

## 2010-10-18 LAB — URINE CULTURE: Colony Count: 10000

## 2010-10-24 ENCOUNTER — Other Ambulatory Visit: Payer: Self-pay | Admitting: Family Medicine

## 2010-10-24 MED ORDER — PRAVASTATIN SODIUM 80 MG PO TABS: 80.0000 mg | ORAL_TABLET | Freq: Every day | ORAL | Status: DC

## 2010-11-16 ENCOUNTER — Encounter: Payer: Self-pay | Admitting: Family Medicine

## 2010-11-19 ENCOUNTER — Encounter: Payer: Self-pay | Admitting: Family Medicine

## 2010-11-19 ENCOUNTER — Ambulatory Visit (INDEPENDENT_AMBULATORY_CARE_PROVIDER_SITE_OTHER): Payer: Medicare Other | Admitting: Family Medicine

## 2010-11-19 DIAGNOSIS — Z1211 Encounter for screening for malignant neoplasm of colon: Secondary | ICD-10-CM

## 2010-11-19 DIAGNOSIS — I10 Essential (primary) hypertension: Secondary | ICD-10-CM

## 2010-11-19 DIAGNOSIS — E785 Hyperlipidemia, unspecified: Secondary | ICD-10-CM

## 2010-11-19 DIAGNOSIS — F329 Major depressive disorder, single episode, unspecified: Secondary | ICD-10-CM

## 2010-11-19 LAB — LIPID PANEL
LDL Cholesterol: 152 mg/dL — ABNORMAL HIGH (ref 0–99)
Triglycerides: 180 mg/dL — ABNORMAL HIGH (ref <150)

## 2010-11-19 MED ORDER — BUPROPION HCL ER (SR) 150 MG PO TB12: 150.0000 mg | ORAL_TABLET | Freq: Two times a day (BID) | ORAL | Status: DC

## 2010-11-19 NOTE — Assessment & Plan Note (Addendum)
Will change to bid dosing to see if more cost effective. Dose is working well to control her mood. Followup in one year since she is well controlled.

## 2010-11-19 NOTE — Progress Notes (Signed)
  Subjective:    Patient ID: Victoria Benjamin, female    DOB: 03-01-1946, 65 y.o.   MRN: [id number]  Hyperlipidemia This is a chronic problem. The problem is controlled. Factors aggravating her hyperlipidemia include no known factors. Current antihyperlipidemic treatment includes statins. There are no compliance problems.  Risk factors for coronary artery disease include no known risk factors.   is tolerating the medications well. No myalgias or side effects.  Depression-she would like to change her Wellbutrin to the short-acting version to see if she could safely. She's been on the 300 X. are since 2008. The last time she tried to fill it cost her over $100. She is happy with her current regimen and doesn't want to change it. She would like to continue taking an antidepressant.  Review of Systems     Objective:   Physical Exam  Constitutional: She is oriented to person, place, and time. She appears well-developed and well-nourished.  Cardiovascular: Normal rate and regular rhythm.   Pulmonary/Chest: Effort normal and breath sounds normal.  Musculoskeletal: She exhibits no edema.  Neurological: She is alert and oriented to person, place, and time.  Skin: Skin is warm and dry.  Psychiatric: She has a normal mood and affect.          Assessment & Plan:  She would like to go ahead and do a referral for colonoscopy. She is well overdue for this. We will have to put in a referral.  I gave her handout on the shingles for her to consider immunization. She's currently on Medicare so if she does decide she wants to do it and we can give her a prescription to take her pharmacy since it is covered under her Medicare part D.  Chronic kidney disease-recheck baseline kidney function.

## 2010-11-19 NOTE — Assessment & Plan Note (Addendum)
Due for labs so given lab slip today. She is fasting. Followup in one year if she is well controlled.

## 2010-11-20 ENCOUNTER — Other Ambulatory Visit: Payer: Self-pay | Admitting: Family Medicine

## 2010-11-20 LAB — COMPLETE METABOLIC PANEL WITH GFR
AST: 20 U/L (ref 0–37)
Albumin: 4.4 g/dL (ref 3.5–5.2)
Alkaline Phosphatase: 79 U/L (ref 39–117)
Calcium: 9.7 mg/dL (ref 8.4–10.5)
Chloride: 106 mEq/L (ref 96–112)
GFR, Est African American: >60 mL/min (ref >60)
Glucose, Bld: 91 mg/dL (ref 70–99)
Total Bilirubin: 0.7 mg/dL (ref 0.3–1.2)

## 2010-11-20 MED ORDER — ROSUVASTATIN CALCIUM 10 MG PO TABS: 10.0000 mg | ORAL_TABLET | Freq: Every day | ORAL | Status: DC

## 2010-11-20 NOTE — Telephone Encounter (Signed)
Pt informed and would like a script to be sent through her [facility]. Routed to Dr. Marlyne Beards, LPN [facility]

## 2010-11-20 NOTE — Telephone Encounter (Signed)
Call patient: Complete metabolic panel is normal. Kidney function actually looks a little bit better at this time. Cholesterol is too high. LDL is 152, goal is 100. Since she started taking pravastatin 80 mg we really need to change her to a different statin which is slowly stronger. I entered the medication. Please ask her if she would like to sent to mail order or if she would like it sent to her local pharmacy first. Please enter the pharmacy and hit sign Thank you

## 2010-11-27 ENCOUNTER — Other Ambulatory Visit: Payer: Self-pay | Admitting: Family Medicine

## 2010-11-27 NOTE — Telephone Encounter (Signed)
This medication for the crestor was sent. Jarvis Newcomer, LPN [facility]

## 2010-11-28 NOTE — Telephone Encounter (Signed)
Closed

## 2010-12-03 ENCOUNTER — Other Ambulatory Visit: Payer: Self-pay | Admitting: *Deleted

## 2010-12-03 MED ORDER — ROSUVASTATIN CALCIUM 10 MG PO TABS: 10.0000 mg | ORAL_TABLET | Freq: Every day | ORAL | Status: DC

## 2010-12-03 MED ORDER — BUPROPION HCL ER (SR) 150 MG PO TB12: 150.0000 mg | ORAL_TABLET | Freq: Two times a day (BID) | ORAL | Status: DC

## 2010-12-03 NOTE — Telephone Encounter (Signed)
Pt called and states meds were sent to the wrong pharm. Per pt need wellbutrin and crestor sent [facility] for 90 day supply and since she out of the crestor send 30 day to [facility] [address] in [place].

## 2010-12-10 ENCOUNTER — Telehealth: Payer: Self-pay | Admitting: Family Medicine

## 2010-12-10 MED ORDER — ATORVASTATIN CALCIUM 80 MG PO TABS: 80.0000 mg | ORAL_TABLET | Freq: Every day | ORAL | Status: DC

## 2010-12-10 NOTE — Telephone Encounter (Signed)
Pt called and wants to know why her chol med was changed.   Plan:  Reviewed the recent labs on pt and because 3 parts of the chol panel were high I explained to pt that this was probably the reason.  I could not find an in basket note or telephone call to tell me pt had been called and informed.  Please advise if I am over looking something.  Pt said crestor cost close to 200.00 and the pravastatin didn't cost her anything. Routed to Dr. Linford Arnold

## 2010-12-10 NOTE — Telephone Encounter (Signed)
Pt called and LMOM for the triage nurse stating she needed to ask some questions to the nurse. Plan:  [facility] for the pt that her call had been returned and she may call the triage nurse back at her convenience. Jarvis Newcomer, LPN [facility]

## 2010-12-10 NOTE — Telephone Encounter (Signed)
I agree that is really weird. I see crestor was rx on 7-11 and then again on 7-24 but usually I send a new med as part of a phone note when I review the lab, but I don't see a phone note etc either.  She does need to switch to something else though. The pravastatin 80mg  was not enough to control her cholesterol.  We can change to lipitor and see if cheaper since it is now generic. Please tell her I apologize for no one contacting her about the medication change.  Then lets recheck her chol level in 8 weeks.

## 2010-12-11 NOTE — Telephone Encounter (Signed)
Pt informed and explanation given as to no telephone note was there in chart file to know whether or not Dr. Intended for pt to be changed to the crestor and Dr. Linford Arnold confirmed this.  We apologized and pt already has the crestor she received and will finish out that medicine and then will call the triage nurse later when closer to running out of the crestor so we can send a script for the lipitor or generic for this medicine.  Pt in the interim will check with her insurance to see if lipitor or its generic will be cheaper copay, and slhe will call back and speak with the triage nurse about this.  Pt knows to call for order to recheck chol levels in 8 wks.  Jarvis Newcomer, LPN [facility]

## 2010-12-12 ENCOUNTER — Other Ambulatory Visit: Payer: Self-pay | Admitting: Family Medicine

## 2010-12-12 DIAGNOSIS — Z1231 Encounter for screening mammogram for malignant neoplasm of breast: Secondary | ICD-10-CM

## 2010-12-24 ENCOUNTER — Ambulatory Visit: Payer: Medicare Other

## 2010-12-24 ENCOUNTER — Ambulatory Visit
Admission: RE | Admit: 2010-12-24 | Discharge: 2010-12-24 | Disposition: A | Discharge status: Home / Self Care | Payer: Medicare Other | Source: Ambulatory Visit | Attending: Family Medicine | Admitting: Family Medicine

## 2010-12-24 DIAGNOSIS — Z1231 Encounter for screening mammogram for malignant neoplasm of breast: Secondary | ICD-10-CM

## 2010-12-26 ENCOUNTER — Telehealth: Payer: Self-pay | Admitting: Family Medicine

## 2010-12-26 NOTE — Telephone Encounter (Signed)
Please call patient. Normal mammogram.  Repeat in 1 year.  

## 2011-01-08 NOTE — Telephone Encounter (Signed)
Left message on vm

## 2011-02-20 ENCOUNTER — Ambulatory Visit (INDEPENDENT_AMBULATORY_CARE_PROVIDER_SITE_OTHER): Payer: Medicare Other | Admitting: Family Medicine

## 2011-02-20 DIAGNOSIS — Z23 Encounter for immunization: Secondary | ICD-10-CM

## 2011-02-20 NOTE — Progress Notes (Signed)
Here for flu shot

## 2011-02-24 ENCOUNTER — Other Ambulatory Visit: Payer: Self-pay | Admitting: Family Medicine

## 2011-02-26 ENCOUNTER — Telehealth: Payer: Self-pay | Admitting: Family Medicine

## 2011-02-26 NOTE — Telephone Encounter (Signed)
Patient called left a voice message that [facility] is requesting a fax from our office about her meds that were changed. Pt left a [facility] fax [phone].

## 2011-02-27 ENCOUNTER — Other Ambulatory Visit: Payer: Self-pay | Admitting: *Deleted

## 2011-02-27 MED ORDER — ATORVASTATIN CALCIUM 80 MG PO TABS: 80.0000 mg | ORAL_TABLET | Freq: Every day | ORAL | Status: DC

## 2011-02-27 MED ORDER — BUPROPION HCL ER (SR) 150 MG PO TB12: 150.0000 mg | ORAL_TABLET | Freq: Two times a day (BID) | ORAL | Status: DC

## 2011-02-27 NOTE — Telephone Encounter (Signed)
Pt states she needs her gen wellbutrin and gen lipitor sent to [facility]

## 2011-02-28 ENCOUNTER — Other Ambulatory Visit: Payer: Self-pay | Admitting: *Deleted

## 2011-02-28 MED ORDER — ATORVASTATIN CALCIUM 80 MG PO TABS: 80.0000 mg | ORAL_TABLET | Freq: Every day | ORAL | Status: DC

## 2011-03-10 ENCOUNTER — Other Ambulatory Visit: Payer: Self-pay | Admitting: *Deleted

## 2011-05-26 ENCOUNTER — Other Ambulatory Visit: Payer: Self-pay | Admitting: *Deleted

## 2011-05-26 MED ORDER — BUPROPION HCL ER (SR) 150 MG PO TB12: 150.0000 mg | ORAL_TABLET | Freq: Two times a day (BID) | ORAL | Status: DC

## 2011-05-26 MED ORDER — ATORVASTATIN CALCIUM 80 MG PO TABS: 80.0000 mg | ORAL_TABLET | Freq: Every day | ORAL | Status: DC

## 2011-05-26 MED ORDER — PRAVASTATIN SODIUM 80 MG PO TABS: 80.0000 mg | ORAL_TABLET | Freq: Every day | ORAL | Status: DC

## 2011-07-17 DIAGNOSIS — L820 Inflamed seborrheic keratosis: Secondary | ICD-10-CM | POA: Diagnosis not present

## 2011-07-17 DIAGNOSIS — D2330 Other benign neoplasm of skin of unspecified part of face: Secondary | ICD-10-CM | POA: Diagnosis not present

## 2011-07-17 DIAGNOSIS — L570 Actinic keratosis: Secondary | ICD-10-CM | POA: Diagnosis not present

## 2011-07-17 DIAGNOSIS — L723 Sebaceous cyst: Secondary | ICD-10-CM | POA: Diagnosis not present

## 2012-02-05 ENCOUNTER — Encounter: Payer: Self-pay | Admitting: Family Medicine

## 2012-02-05 ENCOUNTER — Ambulatory Visit (INDEPENDENT_AMBULATORY_CARE_PROVIDER_SITE_OTHER): Payer: Medicare Other | Admitting: Family Medicine

## 2012-02-05 VITALS — BP 99/62 | HR 76 | Wt 145.0 lb

## 2012-02-05 DIAGNOSIS — K219 Gastro-esophageal reflux disease without esophagitis: Secondary | ICD-10-CM

## 2012-02-05 DIAGNOSIS — R42 Dizziness and giddiness: Secondary | ICD-10-CM

## 2012-02-05 DIAGNOSIS — Z23 Encounter for immunization: Secondary | ICD-10-CM | POA: Diagnosis not present

## 2012-02-05 DIAGNOSIS — F329 Major depressive disorder, single episode, unspecified: Secondary | ICD-10-CM | POA: Diagnosis not present

## 2012-02-05 DIAGNOSIS — E785 Hyperlipidemia, unspecified: Secondary | ICD-10-CM

## 2012-02-05 DIAGNOSIS — E119 Type 2 diabetes mellitus without complications: Secondary | ICD-10-CM | POA: Diagnosis not present

## 2012-02-05 DIAGNOSIS — F3289 Other specified depressive episodes: Secondary | ICD-10-CM | POA: Diagnosis not present

## 2012-02-05 LAB — LIPID PANEL
HDL: 40 mg/dL (ref >39)
Total CHOL/HDL Ratio: 4.9 Ratio
VLDL: 36 mg/dL (ref 0–40)

## 2012-02-05 LAB — COMPLETE METABOLIC PANEL WITH GFR
AST: 22 U/L (ref 0–37)
Alkaline Phosphatase: 89 U/L (ref 39–117)
BUN: 18 mg/dL (ref 6–23)
GFR, Est Non African American: 51 mL/min — ABNORMAL LOW
Glucose, Bld: 85 mg/dL (ref 70–99)
Sodium: 140 mEq/L (ref 135–145)
Total Bilirubin: 0.9 mg/dL (ref 0.3–1.2)
Total Protein: 7.1 g/dL (ref 6.0–8.3)

## 2012-02-05 LAB — POCT UA - MICROALBUMIN: Albumin/Creatinine Ratio, Urine, POC: <30

## 2012-02-05 LAB — POCT GLYCOSYLATED HEMOGLOBIN (HGB A1C): Hemoglobin A1C: 7.2

## 2012-02-05 MED ORDER — PRAVASTATIN SODIUM 80 MG PO TABS: 80.0000 mg | ORAL_TABLET | Freq: Every day | ORAL | Status: DC

## 2012-02-05 MED ORDER — AMBULATORY NON FORMULARY MEDICATION: Status: DC

## 2012-02-05 MED ORDER — BUPROPION HCL ER (SR) 150 MG PO TB12: 150.0000 mg | ORAL_TABLET | Freq: Two times a day (BID) | ORAL | Status: DC

## 2012-02-05 MED ORDER — OMEPRAZOLE 20 MG PO CPDR: 20.0000 mg | DELAYED_RELEASE_CAPSULE | Freq: Every day | ORAL | Status: DC

## 2012-02-05 NOTE — Progress Notes (Signed)
  Subjective:    Patient ID: Victoria Benjamin, female    DOB: [birth year], 66 y.o.   MRN: [id number]  HPI GERD - Out of prilosec for a few weeks and reflux got much worse. Has been using TUMS. Would like a new rx.   Depression - Says struggling a little too. She has felt more down recently. She is taking her Wellbutrin regularly. She does not want to adjust the medication. Her daughter recently moved back in with her and this is actually been very stressful.  Hyperlipidemia - due ot recheck labs. Tolerating her statins well.   Normally her blood pressure does run low. She did have a slightly dizzy episode couple days ago. It was brief but she says it wasn't tense. No change in speech or mentation. Has not happened since.   Review of Systems     Objective:   Physical Exam  Constitutional: She is oriented to person, place, and time. She appears well-developed and well-nourished.  HENT:  Head: Normocephalic and atraumatic.  Neck: Neck supple. No thyromegaly present.  Cardiovascular: Normal rate, regular rhythm and normal heart sounds.   Pulmonary/Chest: Effort normal and breath sounds normal.  Lymphadenopathy:    She has no cervical adenopathy.  Neurological: She is alert and oriented to person, place, and time.  Skin: Skin is warm and dry.  Psychiatric: She has a normal mood and affect. Her behavior is normal.          Assessment & Plan:  GERD- Will refill her omeprazole.  Call if still having symptoms when she is back on the Prilosec. She does report cup of coffee a day but no other caffeine.  Depression - PHQ-9 score of 10, GAD- 7 score or 16.  Says doesn't want to adjust her medication. I asked her please have a low threshold to call me if she feels like her meds getting worse. We can easily bumper Wellbutrin 300 or consider getting her into therapy or counseling. Now she is retired did encourage her to get out and maybe consider a small part-time job or maybe even  volunteering. Her daughter has moved back in with her this has been very stressful for her.  Dizziness-I discussed that since her blood pressure tends to run low normally that she needs to be very careful about staying hydrated and not skipping any meals to avoid any hypoglycemic events. If it happens again then please let me know and we can consider further workup and evaluation. No other symptoms to suggest some type of stroke etiology.  Hyperlipidemia - due to recheck lipids. She will go today. We'll go ahead and refill sent to pharmacy. Adjust if needed.  Flu shot given.   Shingles vaccine rx givne.   Hemoglobin A1c and urine microalbumin ordered in error.

## 2012-02-05 NOTE — Patient Instructions (Addendum)
Diet for GERD or PUD Nutrition therapy can help ease the discomfort of gastroesophageal reflux disease (GERD) and peptic ulcer disease (PUD).  HOME CARE INSTRUCTIONS   Eat your meals slowly, in a relaxed setting.   Eat 5 to 6 small meals per day.   If a food causes distress, stop eating it for a period of time.  FOODS TO AVOID  Coffee, regular or decaffeinated.   Cola beverages, regular or low calorie.   Tea, regular or decaffeinated.   Pepper.   Cocoa.   High fat foods, including meats.   Butter, margarine, hydrogenated oil (trans fats).   Peppermint or spearmint (if you have GERD).   Fruits and vegetables if not tolerated.   Alcohol.   Nicotine (smoking or chewing). This is one of the most potent stimulants to acid production in the gastrointestinal tract.   Any food that seems to aggravate your condition.  If you have questions regarding your diet, ask your caregiver or a registered dietitian. TIPS  Lying flat may make symptoms worse. Keep the head of your bed raised 6 to 9 inches (15 to 23 cm) by using a foam wedge or blocks under the legs of the bed.   Do not lay down until 3 hours after eating a meal.   Daily physical activity may help reduce symptoms.  MAKE SURE YOU:   Understand these instructions.   Will watch your condition.   Will get help right away if you are not doing well or get worse.  Document Released: 04/28/2005 Document Revised: 04/17/2011 Document Reviewed: 03/14/2011 Exit[employer] Patient Information 2012 [employer]. 

## 2012-04-20 DIAGNOSIS — D235 Other benign neoplasm of skin of trunk: Secondary | ICD-10-CM | POA: Diagnosis not present

## 2012-04-20 DIAGNOSIS — L570 Actinic keratosis: Secondary | ICD-10-CM | POA: Diagnosis not present

## 2012-04-20 DIAGNOSIS — D0460 Carcinoma in situ of skin of unspecified upper limb, including shoulder: Secondary | ICD-10-CM | POA: Diagnosis not present

## 2012-06-02 ENCOUNTER — Encounter: Payer: Self-pay | Admitting: Sports Medicine

## 2012-06-02 ENCOUNTER — Ambulatory Visit (INDEPENDENT_AMBULATORY_CARE_PROVIDER_SITE_OTHER): Payer: PRIVATE HEALTH INSURANCE | Admitting: Sports Medicine

## 2012-06-02 VITALS — BP 140/83 | HR 85 | Wt 149.0 lb

## 2012-06-02 DIAGNOSIS — J340 Abscess, furuncle and carbuncle of nose: Secondary | ICD-10-CM | POA: Insufficient documentation

## 2012-06-02 DIAGNOSIS — J3489 Other specified disorders of nose and nasal sinuses: Secondary | ICD-10-CM

## 2012-06-02 MED ORDER — DOXYCYCLINE HYCLATE 100 MG PO TABS: 100.0000 mg | ORAL_TABLET | Freq: Two times a day (BID) | ORAL | Status: AC

## 2012-06-02 MED ORDER — KETOROLAC TROMETHAMINE 30 MG/ML IJ SOLN
30.0000 mg | Freq: Once | INTRAMUSCULAR | Status: AC
  Administered 2012-06-02: 30 mg via INTRAMUSCULAR

## 2012-06-02 MED ORDER — TRAMADOL HCL 50 MG PO TABS: 50.0000 mg | ORAL_TABLET | Freq: Three times a day (TID) | ORAL | Status: DC | PRN

## 2012-06-02 NOTE — Assessment & Plan Note (Signed)
With mild swelling. Toradol 30 mg intramuscular. Doxycycline for 7 days. Tramadol for pain at home. Return to clinic if not improved in 7 days to see PCP for consideration of CT scan.

## 2012-06-02 NOTE — Progress Notes (Signed)
Subjective:    CC: nose pain  HPI: Victoria Benjamin is a very pleasant 67 year old female who comes in with a several-day history of pain and swelling that she localizes the right side of the bridge of her nose. She denies any trauma, bug bites, nasal drainage, or constitutional symptoms. The pain is localized, does not radiate, it is moderate. She is desiring a medication not as strong as Vicodin, but stronger than Tylenol.  Past medical history, Surgical history, Family history not pertinant except as noted below, Social history, Allergies, and medications have been entered into the medical record, reviewed, and no changes needed.   Review of Systems: No fevers, chills, night sweats, weight loss, chest pain, or shortness of breath.   Objective:    General: Well Developed, well nourished, and in no acute distress.  Neuro: Alert and oriented x3, extra-ocular muscles intact, sensation grossly intact.  HEENT: Normocephalic, atraumatic, pupils equal round reactive to light, neck supple, no masses, no lymphadenopathy, thyroid nonpalpable. There is a 1-1/2 cm swelling on the right side of her nose that is mildly erythematous and tender to palpation. There is no fluctuance, and her nasopharynx is unremarkable. She has no pain or pressure over the sinuses, mastoid cells, and ear canal and oropharyngeal exams are negative. Skin: Warm and dry, no rashes. Cardiac: Regular rate and rhythm, no murmurs rubs or gallops.  Respiratory: Clear to auscultation bilaterally. Not using accessory muscles, speaking in full sentences.  Impression and Recommendations:

## 2012-06-09 ENCOUNTER — Encounter: Payer: Self-pay | Admitting: Sports Medicine

## 2012-06-09 ENCOUNTER — Ambulatory Visit (INDEPENDENT_AMBULATORY_CARE_PROVIDER_SITE_OTHER): Payer: PRIVATE HEALTH INSURANCE | Admitting: Sports Medicine

## 2012-06-09 VITALS — BP 124/75 | HR 84 | Temp 98.0°F

## 2012-06-09 DIAGNOSIS — J3489 Other specified disorders of nose and nasal sinuses: Secondary | ICD-10-CM | POA: Diagnosis not present

## 2012-06-09 DIAGNOSIS — J340 Abscess, furuncle and carbuncle of nose: Secondary | ICD-10-CM

## 2012-06-09 NOTE — Assessment & Plan Note (Signed)
Resolved  Follow up with PCP as needed

## 2012-06-09 NOTE — Progress Notes (Deleted)
   Subjective:    I'm seeing this patient as a consultation for:    CC:   HPI:  Past medical history, Surgical history, Family history not pertinant except as noted below, Social history, Allergies, and medications have been entered into the medical record, reviewed, and no changes needed.   Review of Systems: No headache, visual changes, nausea, vomiting, diarrhea, constipation, dizziness, abdominal pain, skin rash, fevers, chills, night sweats, weight loss, swollen lymph nodes, body aches, joint swelling, muscle aches, chest pain, shortness of breath, mood changes, visual or auditory hallucinations.   Objective:   General: Well Developed, well nourished, and in no acute distress.  Neuro/Psych: Alert and oriented x3, extra-ocular muscles intact, able to move all 4 extremities, sensation grossly intact. Skin: Warm and dry, no rashes noted.  Respiratory: Not using accessory muscles, speaking in full sentences, trachea midline.  Cardiovascular: Pulses palpable, no extremity edema. Abdomen: Does not appear distended.   Impression and Recommendations:   This case required medical decision making of moderate complexity.  

## 2012-06-09 NOTE — Progress Notes (Signed)
Subjective:    CC: Followup  HPI: Cellulitis: Was present over the entire bridge of the nose, she is status post a 7 day course of doxycycline, is overall significantly improved. She does keep picking at a small pustule over the left side of her nose, but this is also overall improved.  Past medical history, Surgical history, Family history not pertinant except as noted below, Social history, Allergies, and medications have been entered into the medical record, reviewed, and no changes needed.   Review of Systems: No fevers, chills, night sweats, weight loss, chest pain, or shortness of breath.   Objective:    General: Well Developed, well nourished, and in no acute distress.  Neuro: Alert and oriented x3, extra-ocular muscles intact, sensation grossly intact.  HEENT: Normocephalic, atraumatic, pupils equal round reactive to light, neck supple, no masses, no lymphadenopathy, thyroid nonpalpable.  Skin: Warm and dry, no rashes. Small scabbed pustule on the left side of the nose, otherwise erythema, swelling, and warmth resolved. Cardiac: Regular rate and rhythm, no murmurs rubs or gallops.  Respiratory: Clear to auscultation bilaterally. Not using accessory muscles, speaking in full sentences.  Impression and Recommendations:

## 2012-07-01 DIAGNOSIS — Z85828 Personal history of other malignant neoplasm of skin: Secondary | ICD-10-CM | POA: Diagnosis not present

## 2012-07-12 ENCOUNTER — Ambulatory Visit (INDEPENDENT_AMBULATORY_CARE_PROVIDER_SITE_OTHER): Payer: PRIVATE HEALTH INSURANCE | Admitting: Family Medicine

## 2012-07-12 ENCOUNTER — Ambulatory Visit: Payer: Medicare Other | Admitting: Family Medicine

## 2012-07-12 ENCOUNTER — Encounter: Payer: Self-pay | Admitting: Family Medicine

## 2012-07-12 ENCOUNTER — Ambulatory Visit (INDEPENDENT_AMBULATORY_CARE_PROVIDER_SITE_OTHER): Payer: Medicare Other

## 2012-07-12 VITALS — BP 118/64 | HR 91 | Wt 141.0 lb

## 2012-07-12 DIAGNOSIS — M542 Cervicalgia: Secondary | ICD-10-CM | POA: Diagnosis not present

## 2012-07-12 DIAGNOSIS — M5380 Other specified dorsopathies, site unspecified: Secondary | ICD-10-CM

## 2012-07-12 DIAGNOSIS — M5030 Other cervical disc degeneration, unspecified cervical region: Secondary | ICD-10-CM | POA: Diagnosis not present

## 2012-07-12 MED ORDER — MELOXICAM 15 MG PO TABS: 15.0000 mg | ORAL_TABLET | Freq: Every day | ORAL | Status: DC

## 2012-07-12 MED ORDER — CYCLOBENZAPRINE HCL 10 MG PO TABS: 10.0000 mg | ORAL_TABLET | Freq: Every evening | ORAL | Status: DC | PRN

## 2012-07-12 NOTE — Patient Instructions (Addendum)
Cervical Sprain  A cervical sprain is when the ligaments in the neck stretch or tear. The ligaments are the tissues that hold the neck bones in place.  HOME CARE    Put ice on the injured area.   Put ice in a plastic bag.   Place a towel between your skin and the bag.   Leave the ice on for 15 to 20 minutes, 3 to 4 times a day.   Only take medicine as told by your doctor.   Keep all doctor visits as told.   Keep all physical therapy visits as told.   If your doctor gives you a neck collar, wear it as told.   Do not drive while wearing a neck collar.   Adjust your work station so that you have good posture while you work.   Avoid positions and activities that make your problems worse.   Warm up and stretch before being active.  GET HELP RIGHT AWAY IF:    You are bleeding or your stomach is upset.   You have an allergic reaction to your medicine.   Your problems (symptoms) get worse.   You develop new problems.   You lose feeling (numbness) or you cannot move (paralysis) any part of your body.   You have tingling or weakness in any part of your body.   Your pain is not controlled with medicine.   You cannot take less pain medicine over time as planned.   Your activity level does not improve as expected.  MAKE SURE YOU:    Understand these instructions.   Will watch your condition.   Will get help right away if you are not doing well or get worse.  Document Released: 10/15/2007 Document Revised: 07/21/2011 Document Reviewed: 01/30/2011  [employer] Patient Information 2013 [employer].

## 2012-07-12 NOTE — Progress Notes (Signed)
  Subjective:    Patient ID: Victoria Benjamin, female    DOB: 1946/04/04, 67 y.o.   MRN: [id number]  HPI  Neck pain x 1 week, using OTC meds, tramadol and IBU, using ice and heat for relief does not hurt to move.  No hx of neck problems. Has felt stiff and tight.  IBU and tramadol is helping.  No aggravating symptoms. The medications to help some. No numbness or feeling radiating into her arms. No headaches. No trauma.   Review of Systems     Objective:   Physical Exam  Constitutional: She appears well-developed and well-nourished.  Musculoskeletal:  Nontender cervical spine. She is tender over the right paraspinous muscles. Nontender of the sternomastoid. Normal flexion. Decreased extension. Decreased rotation right and left but symmetric. Very decreased side bending to the right. Normal to the left. Shoulders with normal range of motion.  Skin: Skin is warm and dry.  Psychiatric: She has a normal mood and affect. Her behavior is normal.          Assessment & Plan:  Next strain, right-sided-discussed different treatment options. Will send her a prescription for muscle relaxers to use at bedtime. Will switch to Mobic instead of ibuprofen so that she doesn't take them sometimes route a day. She can internally use Tylenol for breakthrough pain in addition to her tramadol. Continue with eating a nice. If he should be great candidate for physical therapy. We'll get an x-ray today because she does have some hardware in her neck to make sure that everything is in place. Also evaluate for any new herniated disc possibility. She's not had any numbness or tingling down her arms. As soon as I get the x-ray results back if it's fairly normal then recommend physical therapy.

## 2012-07-13 ENCOUNTER — Other Ambulatory Visit: Payer: Self-pay | Admitting: Family Medicine

## 2012-07-13 DIAGNOSIS — M542 Cervicalgia: Secondary | ICD-10-CM

## 2013-01-31 ENCOUNTER — Other Ambulatory Visit: Payer: Self-pay | Admitting: Family Medicine

## 2013-02-02 ENCOUNTER — Ambulatory Visit (INDEPENDENT_AMBULATORY_CARE_PROVIDER_SITE_OTHER): Payer: Medicare Other | Admitting: Family Medicine

## 2013-02-02 VITALS — BP 117/71 | HR 79 | Temp 98.9°F

## 2013-02-02 DIAGNOSIS — Z23 Encounter for immunization: Secondary | ICD-10-CM | POA: Diagnosis not present

## 2013-02-02 NOTE — Progress Notes (Signed)
Pt received flu and pneumovax today. Both immunizations tolerated well.[facility]

## 2013-04-29 ENCOUNTER — Other Ambulatory Visit: Payer: Self-pay | Admitting: Family Medicine

## 2013-05-06 ENCOUNTER — Other Ambulatory Visit: Payer: Self-pay | Admitting: Family Medicine

## 2013-05-09 ENCOUNTER — Ambulatory Visit: Payer: Medicare Other | Admitting: Family Medicine

## 2013-05-11 ENCOUNTER — Ambulatory Visit (INDEPENDENT_AMBULATORY_CARE_PROVIDER_SITE_OTHER): Payer: Medicare Other | Admitting: Family Medicine

## 2013-05-11 ENCOUNTER — Encounter: Payer: Self-pay | Admitting: Family Medicine

## 2013-05-11 VITALS — BP 112/62 | HR 76 | Temp 98.2°F | Ht 62.0 in | Wt 148.0 lb

## 2013-05-11 DIAGNOSIS — R7301 Impaired fasting glucose: Secondary | ICD-10-CM | POA: Diagnosis not present

## 2013-05-11 DIAGNOSIS — F329 Major depressive disorder, single episode, unspecified: Secondary | ICD-10-CM | POA: Diagnosis not present

## 2013-05-11 DIAGNOSIS — E785 Hyperlipidemia, unspecified: Secondary | ICD-10-CM

## 2013-05-11 DIAGNOSIS — F3289 Other specified depressive episodes: Secondary | ICD-10-CM | POA: Diagnosis not present

## 2013-05-11 MED ORDER — PRAVASTATIN SODIUM 80 MG PO TABS: 80.0000 mg | ORAL_TABLET | Freq: Every day | ORAL | Status: DC

## 2013-05-11 NOTE — Patient Instructions (Signed)
Increase Wellbutrin to 2 tabs twice a day.

## 2013-05-11 NOTE — Progress Notes (Signed)
   Subjective:    Patient ID: Victoria Benjamin, female    DOB: 02/16/1946, 67 y.o.   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  Last time I saw her 3 months ago we had discussed potentially increasing her Wellbutrin. She wanted to hold off at that time even though her symptoms were not maximally controlled. She says she thought about it some more and is okay with trying an increased. She just filled a 90 day supply on her medication.  IFG - Followup abnormal A1c. When I look back through her chart she did have an elevated hemoglobin A1c from a year ago. It looks like it may not have been fully addressed. She does have a family history of diabetes. Her sister is diabetic. She denies any recent increase in thirst or urination.  Hyperlipidemia-tolerating statin well without any side effects. Lab Results  Component Value Date   CHOL 195 02/05/2012   HDL 40 02/05/2012   LDLCALC 119* 02/05/2012   TRIG 180* 02/05/2012   CHOLHDL 4.9 02/05/2012    Review of Systems     Objective:   Physical Exam  Constitutional: She is oriented to person, place, and time. She appears well-developed and well-nourished.  HENT:  Head: Normocephalic and atraumatic.  Cardiovascular: Normal rate, regular rhythm and normal heart sounds.   No carotid bruits. Radial pulses 2+ bilat  Pulmonary/Chest: Effort normal and breath sounds normal.  Neurological: She is alert and oriented to person, place, and time.  Skin: Skin is warm and dry.  Psychiatric: She has a normal mood and affect. Her behavior is normal.          Assessment & Plan:  Depression-not currently well controlled. She will increase the Wellbutrin to 2 tabs twice a day. She'll run out and about 6 weeks and we can change the dose to 3 mg twice a day. I would like to see her back in 3-4 months to make sure that she's doing well.  Impaired fasting glucose-A1c was 6.0 today. We reviewed important dietary  changes. Reduce concentrated sweets and watch portion sizes on carbohydrates. Encourage regular exercise. She says she does have a treadmill at home but just doesn't use it. Encouraged her to get into a routine. She says she's to exercise regularly before she retired. Followup in 6 months to repeat A1c.  Hyperlipidemia-due to repeat lipids.  meds refilled.

## 2013-05-12 LAB — COMPLETE METABOLIC PANEL WITH GFR
ALT: 15 U/L (ref 0–35)
AST: 19 U/L (ref 0–37)
Albumin: 4.4 g/dL (ref 3.5–5.2)
Alkaline Phosphatase: 86 U/L (ref 39–117)
BUN: 15 mg/dL (ref 6–23)
CO2: 27 mEq/L (ref 19–32)
Calcium: 9.6 mg/dL (ref 8.4–10.5)
Chloride: 104 mEq/L (ref 96–112)
Creat: 0.97 mg/dL (ref 0.50–1.10)
GFR, Est African American: 70 mL/min
GFR, Est Non African American: 61 mL/min
Glucose, Bld: 81 mg/dL (ref 70–99)
Potassium: 3.8 mEq/L (ref 3.5–5.3)
Sodium: 141 mEq/L (ref 135–145)
Total Bilirubin: 0.7 mg/dL (ref 0.3–1.2)
Total Protein: 7 g/dL (ref 6.0–8.3)

## 2013-05-12 LAB — LIPID PANEL
Cholesterol: 156 mg/dL (ref 0–200)
HDL: 48 mg/dL (ref >39)
LDL Cholesterol: 90 mg/dL (ref 0–99)
Total CHOL/HDL Ratio: 3.3 Ratio
Triglycerides: 92 mg/dL (ref <150)
VLDL: 18 mg/dL (ref 0–40)

## 2013-05-12 NOTE — Progress Notes (Signed)
Quick Note:  All labs are normal. ______ 

## 2013-05-27 ENCOUNTER — Encounter: Payer: Self-pay | Admitting: Family Medicine

## 2013-05-27 ENCOUNTER — Ambulatory Visit (INDEPENDENT_AMBULATORY_CARE_PROVIDER_SITE_OTHER): Payer: Medicare Other | Admitting: Family Medicine

## 2013-05-27 ENCOUNTER — Other Ambulatory Visit: Payer: Self-pay | Admitting: Family Medicine

## 2013-05-27 VITALS — BP 119/80 | HR 79 | Temp 98.2°F | Ht 62.0 in | Wt 148.0 lb

## 2013-05-27 DIAGNOSIS — M25519 Pain in unspecified shoulder: Secondary | ICD-10-CM

## 2013-05-27 DIAGNOSIS — M25512 Pain in left shoulder: Secondary | ICD-10-CM

## 2013-05-27 MED ORDER — NAPROXEN 500 MG PO TABS: 500.0000 mg | ORAL_TABLET | Freq: Two times a day (BID) | ORAL | Status: DC | PRN

## 2013-05-27 NOTE — Progress Notes (Signed)
   Subjective:    Patient ID: Victoria Benjamin, female    DOB: [birth year], 68 y.o.   MRN: [id number]  HPI Left shoudler pain x 2 weeks.  Started suddenly. No pop.  No old injury. left shoulder 10/10 no SOB, or CP, pt has difficulty w/raising her arm up she denies any injury she reports that it feels like a knot and the pain runs from her shoulder down to her fingers. Painful at rest or movement.  Using tylenol but taking laot of it. It does helps a lot. No numbness. Pain shots to her hands in the 3rd and 4th fingers. Not sure what may have exactaly triggered it.  she does feel like there some swelling on the anterior side of the shoulder today. She had not noticed previously.     Review of Systems     Objective:   Physical Exam  Constitutional: She appears well-developed and well-nourished.  Musculoskeletal:  Left shoulder is tender over the anterior edge of the acromion him and distal and of the clavicle. Nontender of the biceps tendon. She has pain when she tries to extend her arm past 25. With passive motion she is able to fully extend, internally and externally rotate. Hand grip and strength is normal. Patient was unable to empty can test because she had difficulty getting her arm to 90 to do the test secondary to discomfort.  Skin: Skin is warm and dry.  I do not feel any distinct swelling or see any rashes over the area.  Psychiatric: She has a normal mood and affect. Her behavior is normal.          Assessment & Plan:  Left shoulder pain - symptoms most consistent with bursitis and possibly some mild a.c. joint arthritis. Given handout on stretches to do. Initially I recommend starting an anti-inflammatory which she has not tried. We'll send her a prescription for naproxen to take twice a day. She can continue to take her Tylenol on top of this for pain control and relief. After the weekend if she's feeling some improvement then go ahead and start the exercises. Stop if  experiencing more pain. Ice aggressively o Followup in 3 weeks. If not improving may consider further evaluation including x-ray and possible referral for physical therapy.ver the next few days.

## 2013-08-05 ENCOUNTER — Other Ambulatory Visit: Payer: Self-pay | Admitting: Family Medicine

## 2013-08-15 ENCOUNTER — Ambulatory Visit (INDEPENDENT_AMBULATORY_CARE_PROVIDER_SITE_OTHER): Payer: Medicare Other | Admitting: Family Medicine

## 2013-08-15 ENCOUNTER — Encounter: Payer: Self-pay | Admitting: Family Medicine

## 2013-08-15 VITALS — BP 129/80 | HR 81 | Wt 147.0 lb

## 2013-08-15 DIAGNOSIS — M5412 Radiculopathy, cervical region: Secondary | ICD-10-CM | POA: Diagnosis not present

## 2013-08-15 MED ORDER — PREDNISONE 20 MG PO TABS: ORAL_TABLET | ORAL | Status: AC

## 2013-08-15 NOTE — Progress Notes (Signed)
CC: Victoria Benjamin is a 68 y.o. female is here for right arm pain   Subjective: HPI:  Right arm pain present for the last one to 2 weeks on a daily basis. 10 out of 10 in severity fluctuates throughout the day, mild improvement with Naprosyn, almost completely resolved for matter of hours with hydrocodone, nothing else makes better or worse. There is no positional component to her pain. It is present all hours of the day. No recent trauma or overexertion. Described as a ache sensation from her posterior shoulder all the way down to her hand without any motor or sensory disturbances.  Pain is somewhat worsened quick rotation of neck to the left or right.  Denies chest pain, shortness of breath, irregular heartbeat, nor redness swelling or warmth of the right upper extremity.   Review Of Systems Outlined In HPI  No past medical history on file.  Past Surgical History  Procedure Laterality Date  . Neck surgery  2002  . Hand surgery  2006, 07  . Breast enhancement surgery    . Abdominal hysterectomy      partial   Family History  Problem Relation Age of Onset  . Hypertension Mother   . Arthritis Mother     osteo  . Pulmonary embolism Mother   . Hyperlipidemia Sister     History   Social History  . Marital Status: Married    Spouse Name: N/A    Number of Children: N/A  . Years of Education: N/A   Occupational History  . Not on file.   Social History Main Topics  . Smoking status: Former Research scientist (life sciences)  . Smokeless tobacco: Not on file  . Alcohol Use: Yes  . Drug Use: No  . Sexual Activity:    Other Topics Concern  . Not on file   Social History Narrative  . No narrative on file     Objective: BP 129/80  Pulse 81  Wt 147 lb (66.679 kg)  General: Alert and Oriented, No Acute Distress HEENT: Pupils equal, round, reactive to light. Conjunctivae clear. Moist mucous membranes pharynx unremarkable Lungs: Clear to auscultation bilaterally, no wheezing/ronchi/rales.   Comfortable work of breathing. Good air movement. Cardiac: Regular rate and rhythm. Normal S1/S2.  No murmurs, rubs, nor gallops.   Right shoulder exam reveals full range of motion and strength in all planes of motion and with individual rotator cuff testing. No overlying redness warmth or swelling.  Neer's test negative.  Hawkins test negative. Empty can negative. Crossarm test negative. O'Brien's test negative. Apprehension test negative. Speed's test negative. Extremities: No peripheral edema.  Strong peripheral pulses.  Mental Status: No depression, anxiety, nor agitation. Skin: Warm and dry.  Assessment & Plan: Kia was seen today for right arm pain.  Diagnoses and associated orders for this visit:  Cervical radiculitis - predniSONE (DELTASONE) 20 MG tablet; Three tabs at once daily for five days.    I believe pain is due to a cervical radiculitis therefore start prednisone continue Naprosyn as needed, call if no improvement by the end of the week  Return if symptoms worsen or fail to improve.

## 2013-09-29 ENCOUNTER — Other Ambulatory Visit: Payer: Self-pay | Admitting: Family Medicine

## 2013-09-29 DIAGNOSIS — Z1231 Encounter for screening mammogram for malignant neoplasm of breast: Secondary | ICD-10-CM

## 2013-10-04 ENCOUNTER — Ambulatory Visit: Payer: Medicare Other

## 2013-10-04 DIAGNOSIS — Z1231 Encounter for screening mammogram for malignant neoplasm of breast: Secondary | ICD-10-CM

## 2013-10-10 DIAGNOSIS — H2510 Age-related nuclear cataract, unspecified eye: Secondary | ICD-10-CM | POA: Diagnosis not present

## 2013-12-15 ENCOUNTER — Ambulatory Visit (INDEPENDENT_AMBULATORY_CARE_PROVIDER_SITE_OTHER): Payer: Medicare Other

## 2013-12-15 ENCOUNTER — Encounter: Payer: Self-pay | Admitting: Sports Medicine

## 2013-12-15 ENCOUNTER — Ambulatory Visit (INDEPENDENT_AMBULATORY_CARE_PROVIDER_SITE_OTHER): Payer: Medicare Other | Admitting: Sports Medicine

## 2013-12-15 VITALS — BP 124/75 | HR 75 | Ht 62.0 in | Wt 146.0 lb

## 2013-12-15 DIAGNOSIS — M79609 Pain in unspecified limb: Secondary | ICD-10-CM

## 2013-12-15 DIAGNOSIS — M79672 Pain in left foot: Secondary | ICD-10-CM

## 2013-12-15 DIAGNOSIS — M7741 Metatarsalgia, right foot: Secondary | ICD-10-CM | POA: Insufficient documentation

## 2013-12-15 DIAGNOSIS — M19079 Primary osteoarthritis, unspecified ankle and foot: Secondary | ICD-10-CM | POA: Diagnosis not present

## 2013-12-15 DIAGNOSIS — M7730 Calcaneal spur, unspecified foot: Secondary | ICD-10-CM | POA: Diagnosis not present

## 2013-12-15 DIAGNOSIS — M7750 Other enthesopathy of unspecified foot: Secondary | ICD-10-CM | POA: Diagnosis not present

## 2013-12-15 MED ORDER — MELOXICAM 15 MG PO TABS: ORAL_TABLET | ORAL | Status: DC

## 2013-12-15 NOTE — Assessment & Plan Note (Signed)
Return for custom orthotics with metatarsal pads. I did place a metatarsal pad in her flip-flop

## 2013-12-15 NOTE — Progress Notes (Signed)
   Subjective:    I'm seeing this patient as a consultation for:  Dr. Madilyn Fireman  CC: Bilateral foot pain  HPI: Patient is a 68 year old woman with  2 week history of recurrent pains in her feet that occur when wearing tennis shoes. She says that the pain is sudden and very intense, but can't describe it qualitatively. She says that the pain comes on when she is walking, and that she usually has to sit down and massage her pain out. She can't think of any precipitating or relieving factors other than the shoes. She also reports a pain that sometimes radiates up her leg. There is sometimes associated swelling, but not predictably with the attacks, and there is no redness warmth or tenderness. She says there is some swelling in the right foot today.  Past medical history, Surgical history, Family history not pertinant except as noted below, Social history, Allergies, and medications have been entered into the medical record, reviewed, and no changes needed.   Review of Systems: No headache, visual changes, nausea, vomiting, diarrhea, constipation, dizziness, abdominal pain, skin rash, fevers, chills, night sweats, weight loss, swollen lymph nodes, body aches,chest pain, shortness of breath, mood changes, visual or auditory hallucinations.   Objective:   General: Well Developed, well nourished, and in no acute distress.  Neuro/Psych: Alert and oriented x3, extra-ocular muscles intact, able to move all 4 extremities, sensation grossly intact. Skin: Warm and dry, no rashes noted.  Respiratory: Not using accessory muscles, speaking in full sentences, trachea midline.  Cardiovascular: Pulses palpable, no extremity edema. Abdomen: Does not appear distended. Right Foot: No visible erythema; slight swelling over dorsum. Range of motion is full in all directions. Strength is 5/5 in all directions. No hallux valgus. Pes cavus Abnormal callus present on pad of foot. No pain over the navicular prominence,  or base of fifth metatarsal. No tenderness to palpation of the calcaneal insertion of plantar fascia. No pain at the Achilles insertion. No pain over the calcaneal bursa. No pain of the retrocalcaneal bursa. Tenderness to palpation over the tarsal-metatarsal joint No hallux rigidus or limitus. No tenderness palpation over interphalangeal joints. Neurovascularly intact distally. There is pes cavus, drop of the transverse arch with abnormal callus under the second through fourth metatarsal heads. Left foot:  Bilateral pes cavus. Swelling and tenderness to palpation of the dorsum of the midfoot with tenderness at the tarsometatarsal joint.  Impression and Recommendations:    Origin of painful attacks is uncertain, but some of the pain in her right  foot is due to metatarsalgia. She was given a tarsal support for her shoe to help restore the transverse arch.   We are also ordering x-rays to look for arthritic changes and blood tests for uric acid to rule out gout.  This case required medical decision making of moderate complexity.

## 2013-12-15 NOTE — Assessment & Plan Note (Signed)
Pain dorsally at the tarsometatarsal joints. There is also some swelling, I do suspect osteoarthritis. We are going to obtain x-rays and a uric acid level. Adding Mobic, return for injection if no better. She also has significant pes cavus and would benefit from custom orthotics.

## 2013-12-16 LAB — URIC ACID: Uric Acid, Serum: 4.8 mg/dL (ref 2.4–7.0)

## 2013-12-22 ENCOUNTER — Encounter: Payer: Self-pay | Admitting: Sports Medicine

## 2013-12-22 ENCOUNTER — Ambulatory Visit (INDEPENDENT_AMBULATORY_CARE_PROVIDER_SITE_OTHER): Payer: Medicare Other | Admitting: Sports Medicine

## 2013-12-22 VITALS — BP 115/69 | HR 82 | Ht 62.0 in

## 2013-12-22 DIAGNOSIS — M7750 Other enthesopathy of unspecified foot: Secondary | ICD-10-CM

## 2013-12-22 DIAGNOSIS — M7741 Metatarsalgia, right foot: Secondary | ICD-10-CM

## 2013-12-22 DIAGNOSIS — M79609 Pain in unspecified limb: Secondary | ICD-10-CM

## 2013-12-22 DIAGNOSIS — M79672 Pain in left foot: Secondary | ICD-10-CM

## 2013-12-22 NOTE — Assessment & Plan Note (Signed)
Custom orthotics as above she did not have athletic shoes to try these out in. She does have some left tarsometatarsal bossing and osteoarthritis, injected the tarsometatarsal joint today. Return in one month.

## 2013-12-22 NOTE — Progress Notes (Signed)
    Patient was fitted for a : standard, cushioned, semi-rigid orthotic. The orthotic was heated and afterward the patient stood on the orthotic blank positioned on the orthotic stand. The patient was positioned in subtalar neutral position and 10 degrees of ankle dorsiflexion in a weight bearing stance. After completion of molding, a stable base was applied to the orthotic blank. The blank was ground to a stable position for weight bearing. 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  I spent 40 minutes with this patient, greater than 50% was face-to-face time counseling regarding the below diagnosis.  Procedure: Real-time Ultrasound Guided Injection of left second metatarsal-cuneiform joint Device: GE Logiq E  Verbal informed consent obtained.  Time-out conducted.  Noted no overlying erythema, induration, or other signs of local infection.  Skin prepped in a sterile fashion.  Local anesthesia: Topical Ethyl chloride.  With sterile technique and under real time ultrasound guidance:  1 cc kenalog 40, 1 cc lidocaine injected easily. Completed without difficulty  Pain immediately resolved suggesting accurate placement of the medication.  Advised to call if fevers/chills, erythema, induration, drainage, or persistent bleeding.  Images permanently stored and available for review in the ultrasound unit.  Impression: Technically successful ultrasound guided injection.

## 2013-12-22 NOTE — Assessment & Plan Note (Signed)
Did not bring shoes to try the orthotics and, I will use metatarsal pads if still having pain at the next visit.

## 2014-01-20 ENCOUNTER — Ambulatory Visit (INDEPENDENT_AMBULATORY_CARE_PROVIDER_SITE_OTHER): Payer: Medicare Other | Admitting: Sports Medicine

## 2014-01-20 ENCOUNTER — Encounter: Payer: Self-pay | Admitting: Sports Medicine

## 2014-01-20 VITALS — BP 118/78 | HR 83 | Ht 62.0 in | Wt 144.0 lb

## 2014-01-20 DIAGNOSIS — M79672 Pain in left foot: Secondary | ICD-10-CM

## 2014-01-20 DIAGNOSIS — M79609 Pain in unspecified limb: Secondary | ICD-10-CM

## 2014-01-20 DIAGNOSIS — M7750 Other enthesopathy of unspecified foot: Secondary | ICD-10-CM

## 2014-01-20 DIAGNOSIS — M7741 Metatarsalgia, right foot: Secondary | ICD-10-CM

## 2014-01-20 NOTE — Assessment & Plan Note (Signed)
Pain has resolved after tarsometatarsal injection.

## 2014-01-20 NOTE — Assessment & Plan Note (Signed)
Persistent pain despite custom orthotics. Metatarsal pad placed just behind the fourth metatarsophalangeal joint. Good response in office. The persistent pain for the next couple of weeks I am happy to inject the fourth metatarsophalangeal joint.

## 2014-01-20 NOTE — Progress Notes (Signed)
  Subjective:    CC: Followup  HPI: Right foot metatarsalgia: Pain is localized beneath the fourth metatarsal phalangeal joint, we need her custom orthotics at the last visit she continues to have pain, moderate, persistent.  Left foot osteoarthritis : with tarsometatarsal bossing, continues to be resolved after tarsometatarsal joint injection at the last visit.  Past medical history, Surgical history, Family history not pertinant except as noted below, Social history, Allergies, and medications have been entered into the medical record, reviewed, and no changes needed.   Review of Systems: No fevers, chills, night sweats, weight loss, chest pain, or shortness of breath.   Objective:    General: Well Developed, well nourished, and in no acute distress.  Neuro: Alert and oriented x3, extra-ocular muscles intact, sensation grossly intact.  HEENT: Normocephalic, atraumatic, pupils equal round reactive to light, neck supple, no masses, no lymphadenopathy, thyroid nonpalpable.  Skin: Warm and dry, no rashes. Cardiac: Regular rate and rhythm, no murmurs rubs or gallops, no lower extremity edema.  Respiratory: Clear to auscultation bilaterally. Not using accessory muscles, speaking in full sentences.  Metatarsal pad placed behind the right fourth metatarsophalangeal joint.  Impression and Recommendations:

## 2014-01-29 ENCOUNTER — Other Ambulatory Visit: Payer: Self-pay | Admitting: Family Medicine

## 2014-02-07 ENCOUNTER — Ambulatory Visit: Payer: Medicare Other | Admitting: Sports Medicine

## 2014-02-23 ENCOUNTER — Ambulatory Visit: Payer: Medicare Other

## 2014-02-23 ENCOUNTER — Ambulatory Visit (INDEPENDENT_AMBULATORY_CARE_PROVIDER_SITE_OTHER): Payer: Medicare Other | Admitting: Family Medicine

## 2014-02-23 DIAGNOSIS — Z23 Encounter for immunization: Secondary | ICD-10-CM

## 2014-02-23 NOTE — Progress Notes (Signed)
   Subjective:    Patient ID: Victoria Benjamin, female    DOB: Feb 26, 1946, 68 y.o.   MRN: [id number]  HPI    Review of Systems     Objective:   Physical Exam        Assessment & Plan:  Here for flu vaccine today

## 2014-04-29 ENCOUNTER — Other Ambulatory Visit: Payer: Self-pay | Admitting: Family Medicine

## 2014-05-25 ENCOUNTER — Ambulatory Visit (INDEPENDENT_AMBULATORY_CARE_PROVIDER_SITE_OTHER): Payer: Medicare Other | Admitting: Family Medicine

## 2014-05-25 ENCOUNTER — Ambulatory Visit (INDEPENDENT_AMBULATORY_CARE_PROVIDER_SITE_OTHER): Payer: Medicare Other

## 2014-05-25 ENCOUNTER — Encounter: Payer: Self-pay | Admitting: Family Medicine

## 2014-05-25 VITALS — BP 107/62 | HR 76 | Ht 62.0 in | Wt 144.0 lb

## 2014-05-25 DIAGNOSIS — R7301 Impaired fasting glucose: Secondary | ICD-10-CM

## 2014-05-25 DIAGNOSIS — M810 Age-related osteoporosis without current pathological fracture: Secondary | ICD-10-CM

## 2014-05-25 DIAGNOSIS — F329 Major depressive disorder, single episode, unspecified: Secondary | ICD-10-CM | POA: Diagnosis not present

## 2014-05-25 DIAGNOSIS — M25562 Pain in left knee: Secondary | ICD-10-CM

## 2014-05-25 DIAGNOSIS — K219 Gastro-esophageal reflux disease without esophagitis: Secondary | ICD-10-CM | POA: Diagnosis not present

## 2014-05-25 DIAGNOSIS — E785 Hyperlipidemia, unspecified: Secondary | ICD-10-CM

## 2014-05-25 DIAGNOSIS — M25572 Pain in left ankle and joints of left foot: Secondary | ICD-10-CM | POA: Diagnosis not present

## 2014-05-25 DIAGNOSIS — M1712 Unilateral primary osteoarthritis, left knee: Secondary | ICD-10-CM | POA: Diagnosis not present

## 2014-05-25 DIAGNOSIS — F32A Depression, unspecified: Secondary | ICD-10-CM

## 2014-05-25 LAB — POCT GLYCOSYLATED HEMOGLOBIN (HGB A1C): Hemoglobin A1C: 6.1

## 2014-05-25 MED ORDER — PRAVASTATIN SODIUM 80 MG PO TABS: 80.0000 mg | ORAL_TABLET | Freq: Every day | ORAL | Status: DC

## 2014-05-25 MED ORDER — BUPROPION HCL ER (SR) 150 MG PO TB12: 300.0000 mg | ORAL_TABLET | Freq: Two times a day (BID) | ORAL | Status: DC

## 2014-05-25 NOTE — Progress Notes (Signed)
   Subjective:    Patient ID: Victoria Benjamin, female    DOB: October 24, 1945, 69 y.o.   MRN: [id number]  HPI Hyperlipidemia-patient's  on pravastatin. No side effects or myalgias. Lab Results  Component Value Date   CHOL 156 05/11/2013   HDL 48 05/11/2013   LDLCALC 90 05/11/2013   TRIG 92 05/11/2013   CHOLHDL 3.3 05/11/2013   IFG- no inc thirst or urination. Last A1c a year ago was 6.0.  Left knee pain several months.  Says keeping her up at night. No giving our locks.  Injury as a child.  No swelling.  No recent xrays. Using IBU about 9-10 a day to help her rest.    Depression-currently on Wellbutrin under 50 mg twice a day.  GERD-taking reflux medication regularly. She does like her symptoms are fairly well controlled.  Review of Systems     Objective:   Physical Exam  Constitutional: She is oriented to person, place, and time. She appears well-developed and well-nourished.  HENT:  Head: Normocephalic and atraumatic.  Cardiovascular: Normal rate, regular rhythm and normal heart sounds.   Pulmonary/Chest: Effort normal and breath sounds normal.  Musculoskeletal:  Left knee is nontender on exam. Normal range of motion. No significant crepitus under the kneecap. Nontender along the joint lines. No increased laxity with anterior or posterior drawer. Strength at the knee and ankle 5 out of 5. No swelling, erythema, induration, or rash.  Neurological: She is alert and oriented to person, place, and time.  Skin: Skin is warm and dry.  Psychiatric: She has a normal mood and affect. Her behavior is normal.          Assessment & Plan:  Hyperlipidemia-due to recheck lipids and liver enzymes. Lab slip provided.  Impaired fasting glucose-up just a little compared to last year. Make sure working on diet and exercise. Recheck in 6 months. Lab Results  Component Value Date   HGBA1C 6.1 05/25/2014    Left knee pain-unclear etiology at this point. We will start with an x-ray since  her pain has been persistent for several months and is to the point resection keeping her awake at night.  Depression-she currently feels like her symptoms are well controlled and would like a refill.  GERD - will refill reflux medication today.

## 2014-05-26 ENCOUNTER — Other Ambulatory Visit: Payer: Self-pay | Admitting: Family Medicine

## 2014-05-26 DIAGNOSIS — Z780 Asymptomatic menopausal state: Secondary | ICD-10-CM

## 2014-05-29 DIAGNOSIS — E785 Hyperlipidemia, unspecified: Secondary | ICD-10-CM | POA: Diagnosis not present

## 2014-05-29 DIAGNOSIS — R7301 Impaired fasting glucose: Secondary | ICD-10-CM | POA: Diagnosis not present

## 2014-05-30 ENCOUNTER — Encounter: Payer: Self-pay | Admitting: Sports Medicine

## 2014-05-30 ENCOUNTER — Other Ambulatory Visit: Payer: Self-pay | Admitting: Family Medicine

## 2014-05-30 ENCOUNTER — Ambulatory Visit (INDEPENDENT_AMBULATORY_CARE_PROVIDER_SITE_OTHER): Payer: Medicare Other | Admitting: Sports Medicine

## 2014-05-30 VITALS — BP 113/67 | HR 103 | Wt 146.0 lb

## 2014-05-30 DIAGNOSIS — M7741 Metatarsalgia, right foot: Secondary | ICD-10-CM | POA: Diagnosis not present

## 2014-05-30 DIAGNOSIS — M79672 Pain in left foot: Secondary | ICD-10-CM

## 2014-05-30 DIAGNOSIS — E785 Hyperlipidemia, unspecified: Secondary | ICD-10-CM

## 2014-05-30 LAB — COMPLETE METABOLIC PANEL WITH GFR
ALT: 41 U/L — AB (ref 0–35)
AST: 36 U/L (ref 0–37)
Albumin: 4.4 g/dL (ref 3.5–5.2)
Alkaline Phosphatase: 86 U/L (ref 39–117)
BILIRUBIN TOTAL: 0.8 mg/dL (ref 0.2–1.2)
BUN: 17 mg/dL (ref 6–23)
CO2: 29 meq/L (ref 19–32)
CREATININE: 1.06 mg/dL (ref 0.50–1.10)
Calcium: 9.5 mg/dL (ref 8.4–10.5)
Chloride: 104 mEq/L (ref 96–112)
GFR, EST NON AFRICAN AMERICAN: 54 mL/min — AB
GFR, Est African American: 62 mL/min
GLUCOSE: 89 mg/dL (ref 70–99)
Potassium: 4.7 mEq/L (ref 3.5–5.3)
SODIUM: 141 meq/L (ref 135–145)
Total Protein: 6.6 g/dL (ref 6.0–8.3)

## 2014-05-30 LAB — LIPID PANEL
CHOL/HDL RATIO: 4.7 ratio
Cholesterol: 205 mg/dL — ABNORMAL HIGH (ref 0–200)
HDL: 44 mg/dL (ref >39)
LDL Cholesterol: 128 mg/dL — ABNORMAL HIGH (ref 0–99)
Triglycerides: 164 mg/dL — ABNORMAL HIGH (ref <150)
VLDL: 33 mg/dL (ref 0–40)

## 2014-05-30 NOTE — Assessment & Plan Note (Signed)
Persistent pain at the right fourth metatarsophalangeal joint despite custom orthotics and a metatarsal pad.  injection into the right fourth MTP. Next line return in a month.

## 2014-05-30 NOTE — Progress Notes (Signed)
  Subjective:    CC:  Follow-up  HPI: Left foot pain: Continues to be pain-free after tarsal metatarsal injection at previous visit.  Right foot pain: Unfortunately continues to have metatarsalgia at the fourth MTP despite custom orthotics with metatarsal pad placement.  Past medical history, Surgical history, Family history not pertinant except as noted below, Social history, Allergies, and medications have been entered into the medical record, reviewed, and no changes needed.   Review of Systems: No fevers, chills, night sweats, weight loss, chest pain, or shortness of breath.   Objective:    General: Well Developed, well nourished, and in no acute distress.  Neuro: Alert and oriented x3, extra-ocular muscles intact, sensation grossly intact.  HEENT: Normocephalic, atraumatic, pupils equal round reactive to light, neck supple, no masses, no lymphadenopathy, thyroid nonpalpable.  Skin: Warm and dry, no rashes. Cardiac: Regular rate and rhythm, no murmurs rubs or gallops, no lower extremity edema.  Respiratory: Clear to auscultation bilaterally. Not using accessory muscles, speaking in full sentences. Right Foot: No visible erythema or swelling. Range of motion is full in all directions. Strength is 5/5 in all directions. No hallux valgus. No pes cavus or pes planus. No abnormal callus noted. No pain over the navicular prominence, or base of fifth metatarsal. No tenderness to palpation of the calcaneal insertion of plantar fascia. No pain at the Achilles insertion. No pain over the calcaneal bursa. No pain of the retrocalcaneal bursa. Tender to palpation at the fourth metatarsophalangeal joint. No hallux rigidus or limitus. No tenderness palpation over interphalangeal joints. No pain with compression of the metatarsal heads. Neurovascularly intact distally.  Procedure: Real-time Ultrasound Guided Injection of  Right fourth metatarsophalangeal joint Device: GE Logiq E  Verbal  informed consent obtained.  Time-out conducted.  Noted no overlying erythema, induration, or other signs of local infection.  Skin prepped in a sterile fashion.  Local anesthesia: Topical Ethyl chloride.  With sterile technique and under real time ultrasound guidance:  30-gauge needle advanced in the joint, 0.5 mL kenalog 40, 0.5 mL lidocaine injected easily. Completed without difficulty  Pain immediately resolved suggesting accurate placement of the medication.  Advised to call if fevers/chills, erythema, induration, drainage, or persistent bleeding.  Images permanently stored and available for review in the ultrasound unit.  Impression: Technically successful ultrasound guided injection.  Impression and Recommendations:

## 2014-05-30 NOTE — Assessment & Plan Note (Signed)
Continues to be pain free after TMT injection.

## 2014-06-14 ENCOUNTER — Ambulatory Visit (INDEPENDENT_AMBULATORY_CARE_PROVIDER_SITE_OTHER): Payer: Medicare Other

## 2014-06-14 DIAGNOSIS — Z780 Asymptomatic menopausal state: Secondary | ICD-10-CM

## 2014-06-14 DIAGNOSIS — M859 Disorder of bone density and structure, unspecified: Secondary | ICD-10-CM

## 2014-06-14 DIAGNOSIS — M8580 Other specified disorders of bone density and structure, unspecified site: Secondary | ICD-10-CM | POA: Diagnosis not present

## 2014-06-14 DIAGNOSIS — Z13820 Encounter for screening for osteoporosis: Secondary | ICD-10-CM | POA: Diagnosis not present

## 2014-06-19 ENCOUNTER — Encounter: Payer: Self-pay | Admitting: Family Medicine

## 2014-06-19 DIAGNOSIS — M8580 Other specified disorders of bone density and structure, unspecified site: Secondary | ICD-10-CM | POA: Insufficient documentation

## 2014-06-20 ENCOUNTER — Ambulatory Visit (INDEPENDENT_AMBULATORY_CARE_PROVIDER_SITE_OTHER): Payer: Medicare Other | Admitting: Sports Medicine

## 2014-06-20 ENCOUNTER — Encounter: Payer: Self-pay | Admitting: Sports Medicine

## 2014-06-20 ENCOUNTER — Ambulatory Visit (INDEPENDENT_AMBULATORY_CARE_PROVIDER_SITE_OTHER): Payer: Medicare Other

## 2014-06-20 VITALS — BP 118/71 | HR 74 | Ht 62.0 in | Wt 140.0 lb

## 2014-06-20 DIAGNOSIS — M170 Bilateral primary osteoarthritis of knee: Secondary | ICD-10-CM | POA: Insufficient documentation

## 2014-06-20 DIAGNOSIS — M1712 Unilateral primary osteoarthritis, left knee: Secondary | ICD-10-CM

## 2014-06-20 DIAGNOSIS — M7741 Metatarsalgia, right foot: Secondary | ICD-10-CM

## 2014-06-20 DIAGNOSIS — M160 Bilateral primary osteoarthritis of hip: Secondary | ICD-10-CM | POA: Diagnosis not present

## 2014-06-20 DIAGNOSIS — M1612 Unilateral primary osteoarthritis, left hip: Secondary | ICD-10-CM

## 2014-06-20 NOTE — Assessment & Plan Note (Signed)
Intra-articular injection today. Return in one month.

## 2014-06-20 NOTE — Assessment & Plan Note (Signed)
Resolving after fourth metatarsophalangeal injection. Several more metatarsal pads given to place in her shoes.

## 2014-06-20 NOTE — Assessment & Plan Note (Signed)
X-rays, rehabilitation, if no better in a month we will do an injection.

## 2014-06-20 NOTE — Progress Notes (Signed)
  Subjective:    CC: follow-up  HPI: Metatarsalgia: Improving after metatarsal-phalangeal joint injection at the last visit.  Left knee pain: Moderate, persistent, localized at the medial joint line, stiffness in the morning, minimal swelling.  Left groin pain: Localize in the groin, radiates around to the buttock, moderate, persistent, worse with weightbearing.  Past medical history, Surgical history, Family history not pertinant except as noted below, Social history, Allergies, and medications have been entered into the medical record, reviewed, and no changes needed.   Review of Systems: No fevers, chills, night sweats, weight loss, chest pain, or shortness of breath.   Objective:    General: Well Developed, well nourished, and in no acute distress.  Neuro: Alert and oriented x3, extra-ocular muscles intact, sensation grossly intact.  HEENT: Normocephalic, atraumatic, pupils equal round reactive to light, neck supple, no masses, no lymphadenopathy, thyroid nonpalpable.  Skin: Warm and dry, no rashes. Cardiac: Regular rate and rhythm, no murmurs rubs or gallops, no lower extremity edema.  Respiratory: Clear to auscultation bilaterally. Not using accessory muscles, speaking in full sentences. Left Knee: Normal to inspection with no erythema or effusion or obvious bony abnormalities. Tender to palpation at the medial joint line ROM normal in flexion and extension and lower leg rotation. Ligaments with solid consistent endpoints including ACL, PCL, LCL, MCL. Negative Mcmurray's and provocative meniscal tests. Non painful patellar compression. Patellar and quadriceps tendons unremarkable. Hamstring and quadriceps strength is normal. Left Hip: ROM IR: 60 Deg, ER: 60 Deg, Flexion: 120 Deg, Extension: 100 Deg, Abduction: 45 Deg, Adduction: 45 Deg, reproduction of pain with internal rotation. Strength IR: 5/5, ER: 5/5, Flexion: 5/5, Extension: 5/5, Abduction: 5/5, Adduction: 5/5 Pelvic  alignment unremarkable to inspection and palpation. Standing hip rotation and gait without trendelenburg / unsteadiness. Greater trochanter without tenderness to palpation. No tenderness over piriformis. No SI joint tenderness and normal minimal SI movement.  Procedure: Real-time Ultrasound Guided Injection of left knee Device: GE Logiq E  Verbal informed consent obtained.  Time-out conducted.  Noted no overlying erythema, induration, or other signs of local infection.  Skin prepped in a sterile fashion.  Local anesthesia: Topical Ethyl chloride.  With sterile technique and under real time ultrasound guidance:  2 mL kenalog 40, 4 mL lidocaine injected easily. Completed without difficulty  Pain immediately resolved suggesting accurate placement of the medication.  Advised to call if fevers/chills, erythema, induration, drainage, or persistent bleeding.  Images permanently stored and available for review in the ultrasound unit.  Impression: Technically successful ultrasound guided injection.  Hip x-rays reviewed and shows some subchondral sclerosis but overall no joint space narrowing.  Impression and Recommendations:

## 2014-06-30 ENCOUNTER — Ambulatory Visit: Payer: Medicare Other | Admitting: Sports Medicine

## 2014-07-03 ENCOUNTER — Other Ambulatory Visit: Payer: Self-pay | Admitting: Family Medicine

## 2014-07-03 DIAGNOSIS — E785 Hyperlipidemia, unspecified: Secondary | ICD-10-CM | POA: Diagnosis not present

## 2014-07-03 DIAGNOSIS — R748 Abnormal levels of other serum enzymes: Secondary | ICD-10-CM | POA: Diagnosis not present

## 2014-07-04 LAB — LIPID PANEL
Cholesterol: 185 mg/dL (ref 0–200)
HDL: 64 mg/dL (ref >=46)
LDL CALC: 97 mg/dL (ref 0–99)
Total CHOL/HDL Ratio: 2.9 Ratio
Triglycerides: 122 mg/dL (ref <150)
VLDL: 24 mg/dL (ref 0–40)

## 2014-07-05 ENCOUNTER — Ambulatory Visit (INDEPENDENT_AMBULATORY_CARE_PROVIDER_SITE_OTHER): Payer: Medicare Other | Admitting: Sports Medicine

## 2014-07-05 ENCOUNTER — Encounter: Payer: Self-pay | Admitting: Sports Medicine

## 2014-07-05 ENCOUNTER — Telehealth: Payer: Self-pay

## 2014-07-05 DIAGNOSIS — M7741 Metatarsalgia, right foot: Secondary | ICD-10-CM

## 2014-07-05 LAB — HEPATIC FUNCTION PANEL
ALK PHOS: 87 U/L (ref 39–117)
ALT: 39 U/L — AB (ref 0–35)
AST: 30 U/L (ref 0–37)
Albumin: 4.4 g/dL (ref 3.5–5.2)
Bilirubin, Direct: 0.1 mg/dL (ref 0.0–0.3)
Indirect Bilirubin: 0.5 mg/dL (ref 0.2–1.2)
Total Bilirubin: 0.6 mg/dL (ref 0.2–1.2)
Total Protein: 7.2 g/dL (ref 6.0–8.3)

## 2014-07-05 NOTE — Progress Notes (Signed)
  Subjective:    CC: Follow-up  HPI: Victoria Benjamin is a very pleasant 69 year old female who comes in with recurrence of metatarsalgia. I injected her right fourth metatarsophalangeal joint at the last visit, she had a good response, but unfortunately comes in with pain at the right third metatarsophalangeal joint. We made her custom orthotics with a metatarsal pad however she has removed his, she was unable to tolerate it. Symptoms are moderate, persistent.  Past medical history, Surgical history, Family history not pertinant except as noted below, Social history, Allergies, and medications have been entered into the medical record, reviewed, and no changes needed.   Review of Systems: No fevers, chills, night sweats, weight loss, chest pain, or shortness of breath.   Objective:    General: Well Developed, well nourished, and in no acute distress.  Neuro: Alert and oriented x3, extra-ocular muscles intact, sensation grossly intact.  HEENT: Normocephalic, atraumatic, pupils equal round reactive to light, neck supple, no masses, no lymphadenopathy, thyroid nonpalpable.  Skin: Warm and dry, no rashes. Cardiac: Regular rate and rhythm, no murmurs rubs or gallops, no lower extremity edema.  Respiratory: Clear to auscultation bilaterally. Not using accessory muscles, speaking in full sentences. Right Foot: No visible erythema or swelling. Range of motion is full in all directions. Strength is 5/5 in all directions. No hallux valgus. No pes cavus or pes planus. No abnormal callus noted. No pain over the navicular prominence, or base of fifth metatarsal. No tenderness to palpation of the calcaneal insertion of plantar fascia. No pain at the Achilles insertion. No pain over the calcaneal bursa. No pain of the retrocalcaneal bursa. Tender to palpation over the volar and plantar aspects of the right third metatarsophalangeal joint right fourth metatarsophalangeal joint is pain-free No hallux rigidus  or limitus. No tenderness palpation over interphalangeal joints. No pain with compression of the metatarsal heads. Neurovascularly intact distally. She does continue to have drop of the transverse arch.  Procedure: Real-time Ultrasound Guided Injection of right third metatarsophalangeal joint Device: GE Logiq E  Verbal informed consent obtained.  Time-out conducted.  Noted no overlying erythema, induration, or other signs of local infection.  Skin prepped in a sterile fashion.  Local anesthesia: Topical Ethyl chloride.  With sterile technique and under real time ultrasound guidance:  30-gauge needle advanced into the joint under ultrasound guidance, 0.5 mL kenalog 40, 0.5 mL lidocaine injected easily. Completed without difficulty  Pain immediately resolved suggesting accurate placement of the medication.  Advised to call if fevers/chills, erythema, induration, drainage, or persistent bleeding.  Images permanently stored and available for review in the ultrasound unit.  Impression: Technically successful ultrasound guided injection.  Impression and Recommendations:

## 2014-07-05 NOTE — Telephone Encounter (Signed)
PA is not required patient has Medicare

## 2014-07-05 NOTE — Assessment & Plan Note (Signed)
2 months ago we injected the right fourth metatarsophalangeal joint. She now has some pain at the right third metatarsophalangeal joint, this was injected under ultrasound guidance. I'm going to do an MRI of her foot. She will return to go over MRI results.

## 2014-07-06 ENCOUNTER — Other Ambulatory Visit: Payer: Self-pay | Admitting: *Deleted

## 2014-07-06 DIAGNOSIS — R748 Abnormal levels of other serum enzymes: Secondary | ICD-10-CM

## 2014-07-17 ENCOUNTER — Ambulatory Visit (INDEPENDENT_AMBULATORY_CARE_PROVIDER_SITE_OTHER): Payer: Medicare Other

## 2014-07-17 DIAGNOSIS — M205X1 Other deformities of toe(s) (acquired), right foot: Secondary | ICD-10-CM

## 2014-07-17 DIAGNOSIS — M25474 Effusion, right foot: Secondary | ICD-10-CM

## 2014-07-17 DIAGNOSIS — M216X1 Other acquired deformities of right foot: Secondary | ICD-10-CM | POA: Diagnosis not present

## 2014-07-18 ENCOUNTER — Ambulatory Visit: Payer: Medicare Other | Admitting: Sports Medicine

## 2014-07-18 ENCOUNTER — Encounter: Payer: Self-pay | Admitting: Sports Medicine

## 2014-07-18 ENCOUNTER — Ambulatory Visit (INDEPENDENT_AMBULATORY_CARE_PROVIDER_SITE_OTHER): Payer: Medicare Other | Admitting: Sports Medicine

## 2014-07-18 DIAGNOSIS — M7741 Metatarsalgia, right foot: Secondary | ICD-10-CM | POA: Diagnosis not present

## 2014-07-18 MED ORDER — DICLOFENAC SODIUM 2 % TD SOLN: 2.0000 | Freq: Two times a day (BID) | TRANSDERMAL | Status: DC

## 2014-07-18 NOTE — Progress Notes (Signed)
  Subjective:    CC: MRI results  HPI: Victoria Benjamin returns, she has chronic right foot pain, clinically representing metatarsalgia, with significant breakdown of the transverse arch and clawing of the feet. We've injected her right third and fourth metatarsophalangeal joints with a good the temporary response. We have also made her custom orthotics, with a metatarsal pad that she was initially unable to tolerate. Symptoms are moderate, persistent.  Past medical history, Surgical history, Family history not pertinant except as noted below, Social history, Allergies, and medications have been entered into the medical record, reviewed, and no changes needed.   Review of Systems: No fevers, chills, night sweats, weight loss, chest pain, or shortness of breath.   Objective:    General: Well Developed, well nourished, and in no acute distress.  Neuro: Alert and oriented x3, extra-ocular muscles intact, sensation grossly intact.  HEENT: Normocephalic, atraumatic, pupils equal round reactive to light, neck supple, no masses, no lymphadenopathy, thyroid nonpalpable.  Skin: Warm and dry, no rashes. Cardiac: Regular rate and rhythm, no murmurs rubs or gallops, no lower extremity edema.  Respiratory: Clear to auscultation bilaterally. Not using accessory muscles, speaking in full sentences. Right Foot: No visible erythema or swelling. Range of motion is full in all directions. Strength is 5/5 in all directions. No hallux valgus. No pes cavus or pes planus. No abnormal callus noted. No pain over the navicular prominence, or base of fifth metatarsal. No tenderness to palpation of the calcaneal insertion of plantar fascia. No pain at the Achilles insertion. No pain over the calcaneal bursa. No pain of the retrocalcaneal bursa. Visible breakdown of the transverse arch with concavity on the dorsal aspect and clawing of the toes, there is abnormal callus between the second through fourth metatarsal heads on  the plantar aspect. No hallux rigidus or limitus. No tenderness palpation over interphalangeal joints. No pain with compression of the metatarsal heads. Neurovascularly intact distally.  MRI was personally reviewed and shows widespread metatarsophalangeal degenerative changes worse at the first ray, but also present in the second through fifth, as well as widespread through the midfoot. There is no evidence of stress fracture or Morton's neuroma.  I replaced the metatarsal pad in her orthotic just behind the third and fourth metatarsal heads.  Impression and Recommendations:

## 2014-07-18 NOTE — Assessment & Plan Note (Signed)
Persistent pain at the third and fourth metatarsophalangeal joints despite injections and custom orthotics. She was unable to tolerate a metatarsal pad initially, and does have significant transverse arch breakdown. MRI did show degenerative changes at all of these joints as well as in the midfoot, I have convinced her to try the metatarsal pad again, and we will add topical diclofenac. Return in 6 weeks.

## 2014-08-31 ENCOUNTER — Ambulatory Visit: Payer: PRIVATE HEALTH INSURANCE | Admitting: Sports Medicine

## 2014-09-14 DIAGNOSIS — R748 Abnormal levels of other serum enzymes: Secondary | ICD-10-CM | POA: Diagnosis not present

## 2014-09-14 LAB — HEPATIC FUNCTION PANEL
ALBUMIN: 4.3 g/dL (ref 3.5–5.2)
ALT: 36 U/L — ABNORMAL HIGH (ref 0–35)
AST: 31 U/L (ref 0–37)
Alkaline Phosphatase: 90 U/L (ref 39–117)
BILIRUBIN TOTAL: 0.6 mg/dL (ref 0.2–1.2)
Bilirubin, Direct: 0.1 mg/dL (ref 0.0–0.3)
Indirect Bilirubin: 0.5 mg/dL (ref 0.2–1.2)
Total Protein: 6.9 g/dL (ref 6.0–8.3)

## 2014-09-18 ENCOUNTER — Other Ambulatory Visit: Payer: Self-pay | Admitting: *Deleted

## 2014-09-18 DIAGNOSIS — R748 Abnormal levels of other serum enzymes: Secondary | ICD-10-CM

## 2014-09-21 ENCOUNTER — Ambulatory Visit (INDEPENDENT_AMBULATORY_CARE_PROVIDER_SITE_OTHER): Payer: Medicare Other

## 2014-09-21 DIAGNOSIS — R748 Abnormal levels of other serum enzymes: Secondary | ICD-10-CM | POA: Diagnosis not present

## 2014-09-21 DIAGNOSIS — N281 Cyst of kidney, acquired: Secondary | ICD-10-CM | POA: Diagnosis not present

## 2014-10-02 ENCOUNTER — Other Ambulatory Visit: Payer: Self-pay | Admitting: Family Medicine

## 2014-11-07 ENCOUNTER — Ambulatory Visit (INDEPENDENT_AMBULATORY_CARE_PROVIDER_SITE_OTHER): Payer: Medicare Other | Admitting: Sports Medicine

## 2014-11-07 ENCOUNTER — Encounter: Payer: Self-pay | Admitting: Sports Medicine

## 2014-11-07 VITALS — BP 116/66 | HR 76 | Wt 144.0 lb

## 2014-11-07 DIAGNOSIS — M5416 Radiculopathy, lumbar region: Secondary | ICD-10-CM

## 2014-11-07 DIAGNOSIS — M7741 Metatarsalgia, right foot: Secondary | ICD-10-CM | POA: Diagnosis not present

## 2014-11-07 MED ORDER — PREDNISONE 50 MG PO TABS: ORAL_TABLET | ORAL | Status: DC

## 2014-11-07 NOTE — Assessment & Plan Note (Signed)
History of L1-L2, L2-L3, and L5-S1 disc intrusions on MRI 2001, now 15 years later her anatomy is likely worsening. She is having radiating pain from the anterior left thigh, to the anterior left lower leg, seemingly in an L3 or L4 distribution. Physical therapy, prednisone, x-rays. Return to see me in one month, MRI for interventional no better.

## 2014-11-07 NOTE — Progress Notes (Signed)
  Subjective:    CC: Left leg pain  HPI: For the past several months this pleasant 69 year old female has had pain radiating from her left anterior thigh, left lower leg, no numbness or tingling in her toes or feet. Pain is moderate, persistent. She did have some left knee osteoarthritis, and injection in the past has relieved that pain and she has no pain directly over her knee.  Past medical history, Surgical history, Family history not pertinant except as noted below, Social history, Allergies, and medications have been entered into the medical record, reviewed, and no changes needed.   Review of Systems: No fevers, chills, night sweats, weight loss, chest pain, or shortness of breath.   Objective:    General: Well Developed, well nourished, and in no acute distress.  Neuro: Alert and oriented x3, extra-ocular muscles intact, sensation grossly intact.  HEENT: Normocephalic, atraumatic, pupils equal round reactive to light, neck supple, no masses, no lymphadenopathy, thyroid nonpalpable.  Skin: Warm and dry, no rashes. Cardiac: Regular rate and rhythm, no murmurs rubs or gallops, no lower extremity edema.  Respiratory: Clear to auscultation bilaterally. Not using accessory muscles, speaking in full sentences. Back Exam:  Inspection: Unremarkable  Motion: Flexion 45 deg, Extension 45 deg, Side Bending to 45 deg bilaterally,  Rotation to 45 deg bilaterally  SLR laying: Negative  XSLR laying: Negative  Palpable tenderness: None. FABER: negative. Sensory change: Gross sensation intact to all lumbar and sacral dermatomes.  Reflexes: 2+ at both patellar tendons, 2+ at achilles tendons, Babinski's downgoing.  Strength at foot  Plantar-flexion: 5/5 Dorsi-flexion: 5/5 Eversion: 5/5 Inversion: 5/5  Leg strength  Quad: 5/5 Hamstring: 5/5 Hip flexor: 5/5 Hip abductors: 5/5  Gait unremarkable. Left Knee: Normal to inspection with no erythema or effusion or obvious bony  abnormalities. Palpation normal with no warmth or joint line tenderness or patellar tenderness or condyle tenderness. ROM normal in flexion and extension and lower leg rotation. Ligaments with solid consistent endpoints including ACL, PCL, LCL, MCL. Negative Mcmurray's and provocative meniscal tests. Non painful patellar compression. Patellar and quadriceps tendons unremarkable. Hamstring and quadriceps strength is normal.  Impression and Recommendations:

## 2014-11-07 NOTE — Assessment & Plan Note (Signed)
Persistent pain at the third and fourth MTPs despite injections and orthotics, she is not wearing orthotics now. She does have the with metatarsal pads and I have encouraged increased use.

## 2014-11-17 ENCOUNTER — Other Ambulatory Visit: Payer: Self-pay | Admitting: Family Medicine

## 2014-11-17 DIAGNOSIS — Z1231 Encounter for screening mammogram for malignant neoplasm of breast: Secondary | ICD-10-CM

## 2014-11-23 ENCOUNTER — Ambulatory Visit: Payer: Medicare Other

## 2014-11-23 ENCOUNTER — Ambulatory Visit: Payer: Medicare Other | Admitting: Family Medicine

## 2014-11-23 DIAGNOSIS — Z1231 Encounter for screening mammogram for malignant neoplasm of breast: Secondary | ICD-10-CM | POA: Diagnosis not present

## 2014-11-29 ENCOUNTER — Ambulatory Visit (INDEPENDENT_AMBULATORY_CARE_PROVIDER_SITE_OTHER): Payer: Medicare Other | Admitting: Physical Therapy

## 2014-11-29 ENCOUNTER — Encounter: Payer: Self-pay | Admitting: Physical Therapy

## 2014-11-29 DIAGNOSIS — M2560 Stiffness of unspecified joint, not elsewhere classified: Secondary | ICD-10-CM

## 2014-11-29 DIAGNOSIS — R52 Pain, unspecified: Secondary | ICD-10-CM | POA: Diagnosis present

## 2014-11-29 NOTE — Therapy (Signed)
[place], [place], [address] Phone: [phone]   Fax:  [phone]  Physical Therapy Evaluation  Patient Details  Name: Victoria Benjamin MRN: [id number] Date of Birth: [birth year] Referring Provider:  Silverio Decamp,*  Encounter Date: 11/29/2014      PT End of Session - 11/29/14 0934    Visit Number 1   Number of Visits 8   Date for PT Re-Evaluation 12/27/14   PT Start Time 0843   PT Stop Time 0947   PT Time Calculation (min) 64 min   Activity Tolerance Patient tolerated treatment well      History reviewed. No pertinent past medical history.  Past Surgical History  Procedure Laterality Date  . Neck surgery  2002  . Hand surgery  2006, 07  . Breast enhancement surgery    . Abdominal hysterectomy      partial    There were no vitals filed for this visit.  Visit Diagnosis:  Pain of multiple sites - Plan: PT plan of care cert/re-cert  Stiffness of joints, multiple sites - Plan: PT plan of care cert/re-cert      Subjective Assessment - 11/29/14 0853    Subjective Pt reports her knees began hurting about 3-4 months ago and the MD reports it is from her back.  Some back pain in Lt buttock area.    Pertinent History Had to stop her walking of 4 miles a day when the knee pain and foot.  Wearing her orthotics.    Limitations Sitting;Standing;Walking   How long can you sit comfortably? < 5'   How long can you stand comfortably? < 5'   How long can you walk comfortably? has pain with household ambulation   Diagnostic tests x-rays of foot   Patient Stated Goals wants decreased pain and to return to her walking    Currently in Pain? Yes   Pain Score 1    Pain Location Buttocks   Pain Orientation Left   Pain Descriptors / Indicators Aching   Pain Type Chronic pain   Pain Radiating Towards feels like bone pain to her Lt ankle   Pain Onset More than a month ago   Pain Frequency  Intermittent   Aggravating Factors  standing, sitting  and walking, sleep   Pain Relieving Factors pillow between knees for sleeping            [facility] PT Assessment - 11/29/14 0001    Assessment   Medical Diagnosis Lt lumbar radiculopathy   Onset Date/Surgical Date 08/30/14   Next MD Visit 12/09/14   Prior Therapy none   Precautions   Precautions None   Balance Screen   Has the patient fallen in the past 6 months No   Has the patient had a decrease in activity level because of a fear of falling?  Yes  due to pain   Is the patient reluctant to leave their home because of a fear of falling?  No   Home Environment   Living Environment Private residence   Home Layout Multi-level  basement for washing her clothes   Alternate Level Stairs-Number of Steps --  takes one step at a time - this is basline for her   Prior Function   Level of [place] Retired   Leisure walk, play with dog, work in yard with flowers   Observation/Other Assessments   Focus on Therapeutic Outcomes (FOTO)  38% limited  Posture/Postural Control   Posture/Postural Control Postural limitations   Postural Limitations Forward head;Rounded Shoulders;Increased thoracic kyphosis;Weight shift right  upper trunk shift to the Rt   Posture Comments in long sit Rt LE about 1/4 inch longer.    ROM / Strength   AROM / PROM / Strength AROM;Strength   AROM   AROM Assessment Site Lumbar   Lumbar Flexion WNL   Lumbar Extension WNL   Lumbar - Right Side Bend WNL   Lumbar - Left Side Bend WNL with pain   Lumbar - Right Rotation WNL   Lumbar - Left Rotation WNL   Strength   Overall Strength --  strong contraction of multifidis and TA   Strength Assessment Site --  bilat hips/knees/ankles WNL   Flexibility   Soft Tissue Assessment /Muscle Length yes   Hamstrings slight tightness at 80 degrees bilat   ITB tight Lt LE   Quadratus Lumborum tight Lt with pain   Palpation   Palpation comment  very tight and tender Lt lumbar paraspinals and QL , tight in Lt ITB   Balance   Balance Assessed --  SLS Lt 12 sec, Rt 4 sec                   [facility] Adult PT Treatment/Exercise - 11/29/14 0001    Exercises   Exercises Knee/Hip;Lumbar   Lumbar Exercises: Stretches   Active Hamstring Stretch 1 rep;30 seconds  with strap   Lower Trunk Rotation --  10 reps   ITB Stretch 30 seconds  cross body with strap leg straight and bent   ITB Stretch Limitations Lt very limited due to tightness   Modalities   Modalities Electrical Stimulation;Moist Heat   Moist Heat Therapy   Number Minutes Moist Heat 15 Minutes   Moist Heat Location Lumbar Spine   Electrical Stimulation   Electrical Stimulation Location lumbar   Electrical Stimulation Action IFC   Electrical Stimulation Parameters to tolerance   Electrical Stimulation Goals Pain  tightness                PT Education - 11/29/14 [phone]    Education provided Yes   Education Details HEP   Person(s) Educated Patient   Methods Explanation;Handout   Comprehension Returned demonstration             PT Long Term Goals - 11/29/14 0940    PT LONG TERM GOAL #1   Title I with advanced HEP for flexibility   Time 4   Period Weeks   Status New   PT LONG TERM GOAL #2   Title report overall pain decrease =/> 75% with daily activity   Time 4   Period Weeks   Status New   PT LONG TERM GOAL #3   Title improve FOTO =/< 31% ( CJ level )   Time 4   Period Weeks   Status New   PT LONG TERM GOAL #4   Title increase flexibility Lt ITB to WNL    Time 4   Period Weeks   Status New               Plan - 11/29/14 5625    Clinical Impression Statement Pt presents with significant tightness in her Lt lumbar musculature and a leg length difference Lt with upper body shit to the Rt. Pt has pain in the Lt knee, Rt foot and Lt buttock area. LE flexibility is limited also.  Would benefit from STW, TPR and  stretching    Pt  will benefit from skilled therapeutic intervention in order to improve on the following deficits Difficulty walking;Increased muscle spasms;Decreased activity tolerance;Pain;Impaired flexibility;Postural dysfunction   Rehab Potential Excellent   PT Frequency 2x / week   PT Duration 4 weeks   PT Treatment/Interventions Ultrasound;Moist Heat;Therapeutic exercise;Vasopneumatic Device;Manual techniques;Balance training;Neuromuscular re-education;Cryotherapy;Electrical Stimulation;Patient/family education   PT Next Visit Plan STW back and Lt ITB and flexibilty LE's   Consulted and Agree with Plan of Care Patient         Problem List Patient Active Problem List   Diagnosis Date Noted  . Left lumbar radiculopathy 11/07/2014  . Osteoarthritis of left knee 06/20/2014  . Osteoarthritis of left hip 06/20/2014  . Osteopenia 06/19/2014  . Left foot pain 12/15/2013  . Metatarsalgia of right foot 12/15/2013  . IFG (impaired fasting glucose) 05/11/2013  . MENOPAUSAL SYNDROME 09/02/2007  . HYPERLIPIDEMIA NEC/NOS 09/23/2006  . INSOMNIA, CHRONIC, MILD 09/23/2006  . Depression 09/23/2006    Jeral Pinch, PT 11/29/2014, 9:57 AM  [facility] [place], [place], [address] Phone: [phone]   Fax:  [phone]

## 2014-11-29 NOTE — Patient Instructions (Signed)
Lower Trunk Rotation Stretch      [place] [phone]   Keeping back flat and feet together, rotate knees to left side. Hold _1___ seconds. Repeat _10__ times per set. Do __1__ sets per session. Do __2__ sessions per day.  Supine: Leg Stretch with Strap (Super Advanced)   Lie on back with one leg straight. Hook strap around other foot. Straighten knee. Raise leg to maximal stretch and straighten knee further by tightening quadriceps. Slowly press other leg down as close to floor as possible. Keep lower abdominals tight. Hold _30__ seconds. Warning: Intense stretch. Stay within tolerance. Repeat _1__ times per session. Do _2__ sessions per day. Repeat on the other leg.  Outer Hip Stretch: Reclined IT Band Stretch (Strap)   Strap around opposite foot, pull across only as far as possible with shoulders on mat. Hold for _30___ secs. Repeat __1__ times each leg. 2 times a day. Extensors / Rotators, Supine   Lie supine, one leg straight, other leg bent, knee held by opposite hand. Pull leg across body toward floor. Hold _30__ seconds.  Repeat _1__ times per session. Do _2__ sessions per day. Repeat on the other side  Copyright  VHI. All rights reserved.

## 2014-11-30 ENCOUNTER — Ambulatory Visit: Payer: Medicare Other | Admitting: Family Medicine

## 2014-12-04 DIAGNOSIS — H52223 Regular astigmatism, bilateral: Secondary | ICD-10-CM | POA: Diagnosis not present

## 2014-12-04 DIAGNOSIS — H5200 Hypermetropia, unspecified eye: Secondary | ICD-10-CM | POA: Diagnosis not present

## 2014-12-04 DIAGNOSIS — H524 Presbyopia: Secondary | ICD-10-CM | POA: Diagnosis not present

## 2014-12-04 DIAGNOSIS — Z135 Encounter for screening for eye and ear disorders: Secondary | ICD-10-CM | POA: Diagnosis not present

## 2014-12-04 DIAGNOSIS — H2513 Age-related nuclear cataract, bilateral: Secondary | ICD-10-CM | POA: Diagnosis not present

## 2014-12-05 ENCOUNTER — Encounter: Payer: Self-pay | Admitting: Physical Therapy

## 2014-12-05 ENCOUNTER — Ambulatory Visit (INDEPENDENT_AMBULATORY_CARE_PROVIDER_SITE_OTHER): Payer: Medicare Other | Admitting: Physical Therapy

## 2014-12-05 DIAGNOSIS — R52 Pain, unspecified: Secondary | ICD-10-CM | POA: Diagnosis not present

## 2014-12-05 DIAGNOSIS — M2560 Stiffness of unspecified joint, not elsewhere classified: Secondary | ICD-10-CM

## 2014-12-05 NOTE — Therapy (Signed)
[place], [place], [address] Phone: [phone]   Fax:  [phone]  Physical Therapy Treatment  Patient Details  Name: Victoria Benjamin MRN: [id number] Date of Birth: Sep 18, 1945 Referring Provider:  Silverio Decamp,*  Encounter Date: 12/05/2014      PT End of Session - 12/05/14 0857    Visit Number 2   Number of Visits 8   Date for PT Re-Evaluation 12/27/14   PT Start Time 0851      History reviewed. No pertinent past medical history.  Past Surgical History  Procedure Laterality Date  . Neck surgery  2002  . Hand surgery  2006, 07  . Breast enhancement surgery    . Abdominal hysterectomy      partial    There were no vitals filed for this visit.  Visit Diagnosis:  Pain of multiple sites  Stiffness of joints, multiple sites      Subjective Assessment - 12/05/14 0851    Subjective Pt reports she is still sore in her Lt leg, thought she was going to die after her first visit   Currently in Pain? Yes   Pain Score 2    Pain Location Buttocks   Pain Orientation Left   Pain Descriptors / Indicators Aching   Pain Type Chronic pain   Aggravating Factors  standing   Pain Relieving Factors heat                         [facility] Adult PT Treatment/Exercise - 12/05/14 0001    Lumbar Exercises: Stretches   ITB Stretch --  10 reps cross body with straps   Lumbar Exercises: Supine   Ab Set 10 reps;5 seconds   Clam 10 reps  worked in painfree ROM   Heel Slides 10 reps  VC for form   Bridge 10 reps   Other Supine Lumbar Exercises leg presses x 5 reps each side   Other Supine Lumbar Exercises 2x 20 sec hooklying with toe taps   Lumbar Exercises: Prone   Straight Leg Raise 10 reps  with pelvic press   Opposite Arm/Leg Raise Right arm/Left leg;Left arm/Right leg;10 reps   Other Prone Lumbar Exercises 5 x 5sec pelvic press, then 10 reps bilat knee flex/ext   Lumbar Exercises: Quadruped   [place] 10 reps   Modalities   Modalities Electrical Stimulation;Moist Heat   Moist Heat Therapy   Number Minutes Moist Heat 15 Minutes   Moist Heat Location Lumbar Spine;Knee  Lt knee and ITB   Electrical Stimulation   Electrical Stimulation Location lumbar and Lt knee   Electrical Stimulation Action premod   Electrical Stimulation Parameters to tolerance   Electrical Stimulation Goals Pain   Manual Therapy   Manual Therapy Soft tissue mobilization;Passive ROM   Soft tissue mobilization pt had increased pain with prone soft tissue work in the low back.    Passive ROM stretching Lt hip, lower quadrant stretches.                      PT Long Term Goals - 12/05/14 0856    PT LONG TERM GOAL #1   Title I with advanced HEP for flexibility   Status On-going   PT LONG TERM GOAL #2   Title report overall pain decrease =/> 75% with daily activity   Status On-going   PT LONG TERM GOAL #3   Title improve FOTO =/<  31% ( CJ level )   Status On-going   PT LONG TERM GOAL #4   Title increase flexibility Lt ITB to WNL    Status On-going               Plan - 12/05/14 0935    Clinical Impression Statement Only second visit, patient reports no change over all.  She is still concerned that the problem is her knee.  She has difficulty maintaining a stable core while adding in LE movments. Lt ITB is very tight and has tendernedd.    Pt will benefit from skilled therapeutic intervention in order to improve on the following deficits Difficulty walking;Increased muscle spasms;Decreased activity tolerance;Pain;Impaired flexibility;Postural dysfunction   Rehab Potential Excellent   PT Frequency 2x / week   PT Treatment/Interventions Ultrasound;Moist Heat;Therapeutic exercise;Vasopneumatic Device;Manual techniques;Balance training;Neuromuscular re-education;Cryotherapy;Electrical Stimulation;Patient/family education   PT Home Exercise Plan  progress HEP, core    Consulted and Agree with Plan of Care Patient        Problem List Patient Active Problem List   Diagnosis Date Noted  . Left lumbar radiculopathy 11/07/2014  . Osteoarthritis of left knee 06/20/2014  . Osteoarthritis of left hip 06/20/2014  . Osteopenia 06/19/2014  . Left foot pain 12/15/2013  . Metatarsalgia of right foot 12/15/2013  . IFG (impaired fasting glucose) 05/11/2013  . MENOPAUSAL SYNDROME 09/02/2007  . HYPERLIPIDEMIA NEC/NOS 09/23/2006  . INSOMNIA, CHRONIC, MILD 09/23/2006  . Depression 09/23/2006    Jeral Pinch PT 12/05/2014, 9:38 AM  [facility] [place], [place], [address] Phone: [phone]   Fax:  [phone]

## 2014-12-06 ENCOUNTER — Ambulatory Visit: Payer: PRIVATE HEALTH INSURANCE | Admitting: Sports Medicine

## 2014-12-08 ENCOUNTER — Ambulatory Visit (INDEPENDENT_AMBULATORY_CARE_PROVIDER_SITE_OTHER): Payer: Medicare Other | Admitting: Physical Therapy

## 2014-12-08 DIAGNOSIS — R52 Pain, unspecified: Secondary | ICD-10-CM | POA: Diagnosis not present

## 2014-12-08 DIAGNOSIS — M2560 Stiffness of unspecified joint, not elsewhere classified: Secondary | ICD-10-CM

## 2014-12-08 NOTE — Therapy (Signed)
[facility] [place], [place], [address] Phone: [phone]   Fax:  [phone]  Physical Therapy Treatment  Patient Details  Name: Victoria Benjamin MRN: [id number] Date of Birth: 09/03/1945 Referring Provider:  Silverio Decamp,*  Encounter Date: 12/08/2014      PT End of Session - 12/08/14 0805    Visit Number 3   Number of Visits 8   Date for PT Re-Evaluation 12/27/14   PT Start Time 0802   PT Stop Time 0909   PT Time Calculation (min) 67 min   Activity Tolerance Patient limited by pain      No past medical history on file.  Past Surgical History  Procedure Laterality Date  . Neck surgery  2002  . Hand surgery  2006, 07  . Breast enhancement surgery    . Abdominal hysterectomy      partial    There were no vitals filed for this visit.  Visit Diagnosis:  Pain of multiple sites  Stiffness of joints, multiple sites      Subjective Assessment - 12/08/14 0806    Subjective Pt reports that she had increased pain in her Lt leg after last treatment.  Pain finally eased off on Wednesday. Pt reports HEP has been difficult, but she continues to perform it.    Currently in Pain? No/denies            [facility] Adult PT Treatment/Exercise - 12/08/14 0001    Lumbar Exercises: Stretches   Double Knee to Chest Stretch 1 rep;10 seconds  Pt stopped due to increased LBP   Lower Trunk Rotation 10 seconds;5 reps   Quad Stretch 2 reps;30 seconds  with strap, prone   ITB Stretch 3 reps;20 seconds  also, adductor stretch 20 sec x 3    ITB Stretch Limitations painful on Lt leg   Knee/Hip Exercises: Stretches   Passive Hamstring Stretch Right;Left;3 reps;20 seconds   Knee/Hip Exercises: Aerobic   Nustep L3: 3.5 min - stopped due to increased Lt thigh pain    Knee/Hip Exercises: Prone   Straight Leg Raises Right;Left;2 sets;10 reps   Modalities   Modalities Electrical Stimulation;Moist Heat   Moist Heat Therapy   Number Minutes Moist Heat 15 Minutes   Moist Heat Location Hip   Electrical Stimulation   Electrical Stimulation Location Lt piriformis, Lt glute med and lumbar paraspinal   Electrical Stimulation Action premod to each area   Electrical Stimulation Parameters to tolerance x 15 min    Electrical Stimulation Goals Pain   Manual Therapy   Manual Therapy Myofascial release;Muscle Energy Technique   Myofascial Release to Lt ITB, glute and piriformis   Muscle Energy Technique prone hip flexion contraction 5 sec / Prone press up x 5 sec x  3 reps x 2 sets.  Noted improved ASIS alignment afterward.                      PT Long Term Goals - 12/05/14 0856    PT LONG TERM GOAL #1   Title I with advanced HEP for flexibility   Status On-going   PT LONG TERM GOAL #2   Title report overall pain decrease =/> 75% with daily activity   Status On-going   PT LONG TERM GOAL #3   Title improve FOTO =/< 31% ( CJ level )   Status On-going   PT LONG TERM GOAL #4   Title increase flexibility Lt ITB to  WNL    Status On-going               Plan - 12/08/14 0913    Clinical Impression Statement Pt very point tender in Lt piriformis/ glute med with manual therapy.  Noted elevated Lt ASIS; improved alignment after muscle energy work.  Pt with difficulty tolerating exercises; pt reported Lt thigh fatigue with NuStep,  pain with Lt ITB stretch. Required some VC for form. Pt had some pain with therapeutic exercise and manual work; decreased with use of MHP and estim to affected areas.    Pt will benefit from skilled therapeutic intervention in order to improve on the following deficits Difficulty walking;Increased muscle spasms;Decreased activity tolerance;Pain;Impaired flexibility;Postural dysfunction   Rehab Potential Excellent   PT Frequency 2x / week   PT Duration 4 weeks   PT Treatment/Interventions Ultrasound;Moist Heat;Therapeutic exercise;Vasopneumatic Device;Manual  techniques;Balance training;Neuromuscular re-education;Cryotherapy;Electrical Stimulation;Patient/family education   PT Next Visit Plan Assess leg length bilat. Progress core exercises, assess response to today's treatment.    Consulted and Agree with Plan of Care Patient        Problem List Patient Active Problem List   Diagnosis Date Noted  . Left lumbar radiculopathy 11/07/2014  . Osteoarthritis of left knee 06/20/2014  . Osteoarthritis of left hip 06/20/2014  . Osteopenia 06/19/2014  . Left foot pain 12/15/2013  . Metatarsalgia of right foot 12/15/2013  . IFG (impaired fasting glucose) 05/11/2013  . MENOPAUSAL SYNDROME 09/02/2007  . HYPERLIPIDEMIA NEC/NOS 09/23/2006  . INSOMNIA, CHRONIC, MILD 09/23/2006  . Depression 09/23/2006    Kerin Perna, PTA 12/08/2014 9:30 AM  [place], [place], [address] Phone: [phone]   Fax:  [phone]

## 2014-12-08 NOTE — Patient Instructions (Signed)
Piriformis Stretch, Sitting   Sit, one ankle on opposite knee, same-side hand on crossed knee. Push down on knee, keeping spine straight. Lean torso forward, with flat back, until tension is felt in hamstrings and gluteals of crossed-leg side. Hold _20-30_ seconds.  Repeat _2__ times per session. Do __1-2_ sessions per day.   KNEE: Quadriceps - Prone   Place strap around ankle. Bring ankle toward buttocks. Press hip into surface. Hold _20-30_ seconds. __2_ reps per set, _1-2_ sets per day, _5__ days per week   Copyright  VHI. All rights reserved.

## 2014-12-13 ENCOUNTER — Ambulatory Visit (INDEPENDENT_AMBULATORY_CARE_PROVIDER_SITE_OTHER): Payer: Medicare Other | Admitting: Physical Therapy

## 2014-12-13 ENCOUNTER — Telehealth: Payer: Self-pay | Admitting: Family Medicine

## 2014-12-13 DIAGNOSIS — R52 Pain, unspecified: Secondary | ICD-10-CM | POA: Diagnosis not present

## 2014-12-13 DIAGNOSIS — R7301 Impaired fasting glucose: Secondary | ICD-10-CM

## 2014-12-13 DIAGNOSIS — E785 Hyperlipidemia, unspecified: Secondary | ICD-10-CM

## 2014-12-13 DIAGNOSIS — M2560 Stiffness of unspecified joint, not elsewhere classified: Secondary | ICD-10-CM

## 2014-12-13 NOTE — Telephone Encounter (Signed)
I apologize the Pt would like lab order to be sent down on 8/16 not 8/18

## 2014-12-13 NOTE — Patient Instructions (Signed)
Piriformis Stretch, Supine   Lie supine, folded towel under sacrum, one ankle crossed onto opposite knee. Holding bottom leg behind knee, gently pull legs toward chest and roll toward top-leg side. Feel stretch in hip or pelvic region. You can use strap or towel behind leg if needed.  Only go to the point of stretch - NOT PAIN. Hold _30__ seconds.  Repeat _2__ times per session. Do __1-2_ sessions per day.  [facility] [place] [address] [place], [place] [address]  [phone] (office) [phone] (fax)

## 2014-12-13 NOTE — Telephone Encounter (Signed)
Patient has an appt on 8/17 for Glucose chk and she would like lab order to be sent down on 8/18.  Thank you

## 2014-12-13 NOTE — Therapy (Signed)
[facility], [place], [address] Phone: [phone]   Fax:  [phone]  Physical Therapy Treatment  Patient Details  Name: Victoria Benjamin MRN: [id number] Date of Birth: [birth year] Referring Provider:  Silverio Decamp,*  Encounter Date: 12/13/2014      PT End of Session - 12/13/14 0809    Visit Number 4   Number of Visits 8   Date for PT Re-Evaluation 12/27/14   PT Start Time 0802   PT Stop Time 4970   PT Time Calculation (min) 45 min   Activity Tolerance Patient limited by pain      No past medical history on file.  Past Surgical History  Procedure Laterality Date  . Neck surgery  2002  . Hand surgery  2006, 07  . Breast enhancement surgery    . Abdominal hysterectomy      partial    There were no vitals filed for this visit.  Visit Diagnosis:  Pain of multiple sites  Stiffness of joints, multiple sites      Subjective Assessment - 12/13/14 0805    Subjective Pt reports she had pain relief when she left last treatment, but has been up 2x/night ever since with knee pain up to 8/10.  Piriformis stretch in HEP too painful.She has tried to do HEP, but none of them have given relief.    Currently in Pain? Yes   Pain Score 8    Pain Location Foot   Pain Orientation Right   Pain Descriptors / Indicators Sharp   Aggravating Factors  standing   Pain Relieving Factors rest, non-weight bearing    Multiple Pain Sites Yes   Pain Score 5   Pain Location Sacrum   Pain Orientation Left;Proximal   Pain Descriptors / Indicators Sore   Aggravating Factors  ?   Pain Relieving Factors ?            [facility] PT Assessment - 12/13/14 0001    Assessment   Medical Diagnosis Lt lumbar radiculopathy   Onset Date/Surgical Date 08/30/14   Observation/Other Assessments   Focus on Therapeutic Outcomes (FOTO)  44% limited.  Goals 31% goal.                      [facility] Adult PT  Treatment/Exercise - 12/13/14 0001    Lumbar Exercises: Stretches   Passive Hamstring Stretch 2 reps;30 seconds   Passive Hamstring Stretch Limitations seated    Quad Stretch 2 reps;30 seconds  each leg   Piriformis Stretch 20 seconds;3 reps  each leg   Piriformis Stretch Limitations Painful on Lt, modified to supine with towel assist    Lumbar Exercises: Aerobic   Stationary Bike NuStep L4: 5 min    Knee/Hip Exercises: Stretches   Sports administrator Right;Left;2 reps;30 seconds   Modalities   Modalities Electrical Stimulation;Moist Heat   Moist Heat Therapy   Number Minutes Moist Heat 15 Minutes   Moist Heat Location Hip  Lt    Electrical Stimulation   Electrical Stimulation Location Lt glute/ SI joint   Electrical Stimulation Action IFC   Electrical Stimulation Parameters to tolerance, x 15 min    Electrical Stimulation Goals Pain                PT Education - 12/13/14 0841    Education provided Yes   Education Details switched seated piriformis to supine   Person(s) Educated Patient   Methods Explanation;Handout  Comprehension Verbalized understanding             PT Long Term Goals - 12/13/14 0844    PT LONG TERM GOAL #1   Title I with advanced HEP for flexibility   Status Achieved   PT LONG TERM GOAL #2   Title report overall pain decrease =/> 75% with daily activity   Status Not Met   PT LONG TERM GOAL #3   Title improve FOTO =/< 31% ( CJ level )   Status Not Met   PT LONG TERM GOAL #4   Title increase flexibility Lt ITB to WNL    Status Not Met               Plan - 12/13/14 0841    Clinical Impression Statement Pt's ASIS appear close to level this visit; pt has been utilizing muscle energy technique at home. Pt voiced much frustration with continual pain in Rt foot, Lt knee and hip despite trying HEP and limited therapy session.  Pt has partially met her goals.  Pt interested in D/C and return to MD.    Pt will benefit from skilled  therapeutic intervention in order to improve on the following deficits Difficulty walking;Increased muscle spasms;Decreased activity tolerance;Pain;Impaired flexibility;Postural dysfunction   Rehab Potential Excellent   PT Frequency 2x / week   PT Duration 4 weeks   PT Treatment/Interventions Ultrasound;Moist Heat;Therapeutic exercise;Vasopneumatic Device;Manual techniques;Balance training;Neuromuscular re-education;Cryotherapy;Electrical Stimulation;Patient/family education   PT Next Visit Plan Discussed with supervising PT.  Will d/c and refer back to MD.    Consulted and Agree with Plan of Care Patient        Problem List Patient Active Problem List   Diagnosis Date Noted  . Left lumbar radiculopathy 11/07/2014  . Osteoarthritis of left knee 06/20/2014  . Osteoarthritis of left hip 06/20/2014  . Osteopenia 06/19/2014  . Left foot pain 12/15/2013  . Metatarsalgia of right foot 12/15/2013  . IFG (impaired fasting glucose) 05/11/2013  . MENOPAUSAL SYNDROME 09/02/2007  . HYPERLIPIDEMIA NEC/NOS 09/23/2006  . INSOMNIA, CHRONIC, MILD 09/23/2006  . Depression 09/23/2006    Kerin Perna, PTA 12/13/2014 10:14 AM  [place], [place], [address] Phone: [phone]   Fax:  [phone]   PHYSICAL THERAPY DISCHARGE SUMMARY  Visits from Start of Care: 4  Current functional level related to goals / functional outcomes: See above   Remaining deficits: See above   Education / Equipment: HEP Plan: Patient agrees to discharge.  Patient goals were partially met. Patient is being discharged due to the patient's request. Pt doesn't feel she is making any progress, however she has only had 4 visits. She wishes to D/C.  ?????   Jeral Pinch, PT 12/13/2014 10:15 AM

## 2014-12-13 NOTE — Telephone Encounter (Signed)
Order placed and faxed.Audelia Hives [place]

## 2014-12-15 ENCOUNTER — Encounter: Payer: Self-pay | Admitting: Sports Medicine

## 2014-12-15 ENCOUNTER — Ambulatory Visit (INDEPENDENT_AMBULATORY_CARE_PROVIDER_SITE_OTHER): Payer: Medicare Other | Admitting: Sports Medicine

## 2014-12-15 VITALS — BP 117/69 | HR 87 | Wt 144.0 lb

## 2014-12-15 DIAGNOSIS — M7741 Metatarsalgia, right foot: Secondary | ICD-10-CM

## 2014-12-15 NOTE — Assessment & Plan Note (Signed)
Repeat injection into the right third metatarsophalangeal joint, MRI did show scattered energy changes, nothing else.

## 2014-12-15 NOTE — Progress Notes (Signed)
  Subjective:    CC: Foot pain  HPI:  Victoria Benjamin returns, she has right-sided third metatarsalgia, we did an injection at previous visit that provided good response, she also has custom orthotics with metatarsal pads. Unfortunately she is having recurrence of pain at the right third MTP. She is amenable to proceed with injection. Pain is moderate, persistent localized without radiation, or paresthesias, we did an MRI of her foot sometime ago that simply showed widespread degenerative changes.  Past medical history, Surgical history, Family history not pertinant except as noted below, Social history, Allergies, and medications have been entered into the medical record, reviewed, and no changes needed.   Review of Systems: No fevers, chills, night sweats, weight loss, chest pain, or shortness of breath.   Objective:    General: Well Developed, well nourished, and in no acute distress.  Neuro: Alert and oriented x3, extra-ocular muscles intact, sensation grossly intact.  HEENT: Normocephalic, atraumatic, pupils equal round reactive to light, neck supple, no masses, no lymphadenopathy, thyroid nonpalpable.  Skin: Warm and dry, no rashes. Cardiac: Regular rate and rhythm, no murmurs rubs or gallops, no lower extremity edema.  Respiratory: Clear to auscultation bilaterally. Not using accessory muscles, speaking in full sentences. Right Foot: No visible erythema or swelling. Range of motion is full in all directions. Strength is 5/5 in all directions. No hallux valgus. No pes cavus or pes planus. No abnormal callus noted. No pain over the navicular prominence, or base of fifth metatarsal. No tenderness to palpation of the calcaneal insertion of plantar fascia. No pain at the Achilles insertion. No pain over the calcaneal bursa. No pain of the retrocalcaneal bursa. No tenderness to palpation over the tarsals, metatarsals, or phalanges. No hallux rigidus or limitus. Tender to palpation over the  plantar third MTP No pain with compression of the metatarsal heads. Neurovascularly intact distally.  Procedure: Real-time Ultrasound Guided Injection of right third MTP Device: GE Logiq E  Verbal informed consent obtained.  Time-out conducted.  Noted no overlying erythema, induration, or other signs of local infection.  Skin prepped in a sterile fashion.  Local anesthesia: Topical Ethyl chloride.  With sterile technique and under real time ultrasound guidance: 0.5 mL K on 40 comes her 0.5 mL lidocaine injected easily.  Completed without difficulty  Pain immediately resolved suggesting accurate placement of the medication.  Advised to call if fevers/chills, erythema, induration, drainage, or persistent bleeding.  Images permanently stored and available for review in the ultrasound unit.  Impression: Technically successful ultrasound guided injection.  Impression and Recommendations:

## 2014-12-27 ENCOUNTER — Encounter: Payer: Self-pay | Admitting: Family Medicine

## 2014-12-27 ENCOUNTER — Ambulatory Visit (INDEPENDENT_AMBULATORY_CARE_PROVIDER_SITE_OTHER): Payer: Medicare Other | Admitting: Family Medicine

## 2014-12-27 VITALS — BP 110/64 | HR 73 | Ht 63.0 in | Wt 143.0 lb

## 2014-12-27 DIAGNOSIS — Z23 Encounter for immunization: Secondary | ICD-10-CM | POA: Diagnosis not present

## 2014-12-27 DIAGNOSIS — E785 Hyperlipidemia, unspecified: Secondary | ICD-10-CM

## 2014-12-27 DIAGNOSIS — G4700 Insomnia, unspecified: Secondary | ICD-10-CM

## 2014-12-27 DIAGNOSIS — Z114 Encounter for screening for human immunodeficiency virus [HIV]: Secondary | ICD-10-CM

## 2014-12-27 DIAGNOSIS — F32A Depression, unspecified: Secondary | ICD-10-CM

## 2014-12-27 DIAGNOSIS — Z1159 Encounter for screening for other viral diseases: Secondary | ICD-10-CM

## 2014-12-27 DIAGNOSIS — R7301 Impaired fasting glucose: Secondary | ICD-10-CM

## 2014-12-27 DIAGNOSIS — R748 Abnormal levels of other serum enzymes: Secondary | ICD-10-CM | POA: Diagnosis not present

## 2014-12-27 DIAGNOSIS — F329 Major depressive disorder, single episode, unspecified: Secondary | ICD-10-CM

## 2014-12-27 LAB — COMPLETE METABOLIC PANEL WITH GFR
ALT: 35 U/L — ABNORMAL HIGH (ref 6–29)
AST: 30 U/L (ref 10–35)
Albumin: 4.4 g/dL (ref 3.6–5.1)
Alkaline Phosphatase: 86 U/L (ref 33–130)
BUN: 17 mg/dL (ref 7–25)
CALCIUM: 9.9 mg/dL (ref 8.6–10.4)
CHLORIDE: 103 mmol/L (ref 98–110)
CO2: 29 mmol/L (ref 20–31)
Creat: 0.96 mg/dL (ref 0.50–0.99)
GFR, Est African American: 70 mL/min (ref >=60)
GFR, Est Non African American: 61 mL/min (ref >=60)
Glucose, Bld: 83 mg/dL (ref 65–99)
POTASSIUM: 4.2 mmol/L (ref 3.5–5.3)
Sodium: 140 mmol/L (ref 135–146)
Total Bilirubin: 0.9 mg/dL (ref 0.2–1.2)
Total Protein: 6.8 g/dL (ref 6.1–8.1)

## 2014-12-27 LAB — POCT GLYCOSYLATED HEMOGLOBIN (HGB A1C): HEMOGLOBIN A1C: 5.7

## 2014-12-27 MED ORDER — BUPROPION HCL ER (SR) 150 MG PO TB12: 150.0000 mg | ORAL_TABLET | Freq: Two times a day (BID) | ORAL | Status: DC

## 2014-12-27 NOTE — Progress Notes (Signed)
   Subjective:    Patient ID: Victoria Benjamin, female    DOB: [birth year], 69 y.o.   MRN: [id number]  HPI IFG - not inc thirst or urination. Has really changed her diet and is doing better with that.    Depression - doing well on Wellbutrin. She does complain of little interest or pleasure doing things several days of the week and feeling down several days of the week. Sleep is also a major issue for her as well as some fatigue. No thoughts of harming herself.   Insomnia - she can fall asleep easily but then wakes up for hours.  Says her mind start racing. Used to take Tylenol PM.  Will do puzzles sometimes.    Elevated liver enzymes - she wanted to review recent labs were her liver enzymes are mildly elevated. She does not drink alcohol. She had been taking a lot of Tylenol for foot and ankle pain while being on pravastatin 80 mg. Recent recheck 3 months ago showed that the liver enzyme was trending back down.  Review of Systems     Objective:   Physical Exam  Constitutional: She is oriented to person, place, and time. She appears well-developed and well-nourished.  HENT:  Head: Normocephalic and atraumatic.  Cardiovascular: Normal rate, regular rhythm and normal heart sounds.   Pulmonary/Chest: Effort normal and breath sounds normal.  Neurological: She is alert and oriented to person, place, and time.  Skin: Skin is warm and dry.  Psychiatric: She has a normal mood and affect. Her behavior is normal.          Assessment & Plan:  IFG - she is dong well. Her AC is down 5.9.  She is eating more healthy.    Depression - Fair controlled.  PHQ- 9 score of 6 today.  Continue current regimen. Follow-up in 6 months.  Elevated liver enzymes-we'll recheck today. Suspect it is accommodation of her statin and elevated levels of Tylenol. She is now off of the Tylenol so hopefully on repeat today the liver enzyme will be back to normal.   Hyperlipidemia-We also discussed at some point  considering switching her pravastatin 80 mg to atorvastatin 40 mg. She wants to continue her current prescription and will let me know.  Insomnia - not well controlled. Discussed some strategies and H.O given.    Discussed need for prevnar 13 today.

## 2014-12-27 NOTE — Patient Instructions (Signed)
Insomnia Insomnia is frequent trouble falling and/or staying asleep. Insomnia can be a long term problem or a short term problem. Both are common. Insomnia can be a short term problem when the wakefulness is related to a certain stress or worry. Long term insomnia is often related to ongoing stress during waking hours and/or poor sleeping habits. Overtime, sleep deprivation itself can make the problem worse. Every little thing feels more severe because you are overtired and your ability to cope is decreased. CAUSES   Stress, anxiety, and depression.  Poor sleeping habits.  Distractions such as TV in the bedroom.  Naps close to bedtime.  Engaging in emotionally charged conversations before bed.  Technical reading before sleep.  Alcohol and other sedatives. They may make the problem worse. They can hurt normal sleep patterns and normal dream activity.  Stimulants such as caffeine for several hours prior to bedtime.  Pain syndromes and shortness of breath can cause insomnia.  Exercise late at night.  Changing time zones may cause sleeping problems (jet lag). It is sometimes helpful to have someone observe your sleeping patterns. They should look for periods of not breathing during the night (sleep apnea). They should also look to see how long those periods last. If you live alone or observers are uncertain, you can also be observed at a sleep clinic where your sleep patterns will be professionally monitored. Sleep apnea requires a checkup and treatment. Give your caregivers your medical history. Give your caregivers observations your family has made about your sleep.  SYMPTOMS   Not feeling rested in the morning.  Anxiety and restlessness at bedtime.  Difficulty falling and staying asleep. TREATMENT   Your caregiver may prescribe treatment for an underlying medical disorders. Your caregiver can give advice or help if you are using alcohol or other drugs for self-medication. Treatment  of underlying problems will usually eliminate insomnia problems.  Medications can be prescribed for short time use. They are generally not recommended for lengthy use.  Over-the-counter sleep medicines are not recommended for lengthy use. They can be habit forming.  You can promote easier sleeping by making lifestyle changes such as:  Using relaxation techniques that help with breathing and reduce muscle tension.  Exercising earlier in the day.  Changing your diet and the time of your last meal. No night time snacks.  Establish a regular time to go to bed.  Counseling can help with stressful problems and worry.  Soothing music and white noise may be helpful if there are background noises you cannot remove.  Stop tedious detailed work at least one hour before bedtime. HOME CARE INSTRUCTIONS   Keep a diary. Inform your caregiver about your progress. This includes any medication side effects. See your caregiver regularly. Take note of:  Times when you are asleep.  Times when you are awake during the night.  The quality of your sleep.  How you feel the next day. This information will help your caregiver care for you.  Get out of bed if you are still awake after 15 minutes. Read or do some quiet activity. Keep the lights down. Wait until you feel sleepy and go back to bed.  Keep regular sleeping and waking hours. Avoid naps.  Exercise regularly.  Avoid distractions at bedtime. Distractions include watching television or engaging in any intense or detailed activity like attempting to balance the household checkbook.  Develop a bedtime ritual. Keep a familiar routine of bathing, brushing your teeth, climbing into bed at the same   time each night, listening to soothing music. Routines increase the success of falling to sleep faster.  Use relaxation techniques. This can be using breathing and muscle tension release routines. It can also include visualizing peaceful scenes. You can  also help control troubling or intruding thoughts by keeping your mind occupied with boring or repetitive thoughts like the old concept of counting sheep. You can make it more creative like imagining planting one beautiful flower after another in your backyard garden.  During your day, work to eliminate stress. When this is not possible use some of the previous suggestions to help reduce the anxiety that accompanies stressful situations. MAKE SURE YOU:   Understand these instructions.  Will watch your condition.  Will get help right away if you are not doing well or get worse. Document Released: 04/25/2000 Document Revised: 07/21/2011 Document Reviewed: 05/26/2007 Exit[employer] Patient Information 2015 [employer]. This information is not intended to replace advice given to you by your health care provider. Make sure you discuss any questions you have with your health care provider.  

## 2014-12-28 LAB — HEPATITIS C ANTIBODY: HCV AB: NEGATIVE

## 2014-12-28 LAB — HIV ANTIBODY (ROUTINE TESTING W REFLEX): HIV 1&2 Ab, 4th Generation: NONREACTIVE

## 2015-01-12 ENCOUNTER — Ambulatory Visit: Payer: PRIVATE HEALTH INSURANCE | Admitting: Sports Medicine

## 2015-04-03 DIAGNOSIS — Z85828 Personal history of other malignant neoplasm of skin: Secondary | ICD-10-CM | POA: Diagnosis not present

## 2015-04-03 DIAGNOSIS — Z08 Encounter for follow-up examination after completed treatment for malignant neoplasm: Secondary | ICD-10-CM | POA: Diagnosis not present

## 2015-04-03 DIAGNOSIS — L570 Actinic keratosis: Secondary | ICD-10-CM | POA: Diagnosis not present

## 2015-04-12 ENCOUNTER — Ambulatory Visit (INDEPENDENT_AMBULATORY_CARE_PROVIDER_SITE_OTHER): Payer: Medicare Other | Admitting: Osteopathic Medicine

## 2015-04-12 ENCOUNTER — Encounter: Payer: Self-pay | Admitting: Osteopathic Medicine

## 2015-04-12 VITALS — BP 126/69 | HR 79 | Ht 62.0 in

## 2015-04-12 DIAGNOSIS — M542 Cervicalgia: Secondary | ICD-10-CM | POA: Diagnosis not present

## 2015-04-12 DIAGNOSIS — M9901 Segmental and somatic dysfunction of cervical region: Secondary | ICD-10-CM | POA: Diagnosis not present

## 2015-04-12 MED ORDER — NAPROXEN 500 MG PO TABS: 500.0000 mg | ORAL_TABLET | Freq: Two times a day (BID) | ORAL | Status: DC

## 2015-04-12 MED ORDER — CYCLOBENZAPRINE HCL 5 MG PO TABS: 5.0000 mg | ORAL_TABLET | Freq: Three times a day (TID) | ORAL | Status: DC | PRN

## 2015-04-12 MED ORDER — DICLOFENAC SODIUM 1 % TD GEL: 4.0000 g | Freq: Four times a day (QID) | TRANSDERMAL | Status: DC

## 2015-04-12 NOTE — Patient Instructions (Signed)
Cervical Sprain  A cervical sprain is an injury in the neck in which the strong, fibrous tissues (ligaments) that connect your neck bones stretch or tear. Cervical sprains can range from mild to severe. Severe cervical sprains can cause the neck vertebrae to be unstable. This can lead to damage of the spinal cord and can result in serious nervous system problems. The amount of time it takes for a cervical sprain to get better depends on the cause and extent of the injury. Most cervical sprains heal in 1 to 3 weeks.  CAUSES   Severe cervical sprains may be caused by:    Contact sport injuries (such as from football, rugby, wrestling, hockey, auto racing, gymnastics, diving, martial arts, or boxing).    Motor vehicle collisions.    Whiplash injuries. This is an injury from a sudden forward and backward whipping movement of the head and neck.   Falls.   Mild cervical sprains may be caused by:    Being in an awkward position, such as while cradling a telephone between your ear and shoulder.    Sitting in a chair that does not offer proper support.    Working at a poorly designed computer station.    Looking up or down for long periods of time.   SYMPTOMS    Pain, soreness, stiffness, or a burning sensation in the front, back, or sides of the neck. This discomfort may develop immediately after the injury or slowly, 24 hours or more after the injury.    Pain or tenderness directly in the middle of the back of the neck.    Shoulder or upper back pain.    Limited ability to move the neck.    Headache.    Dizziness.    Weakness, numbness, or tingling in the hands or arms.    Muscle spasms.    Difficulty swallowing or chewing.    Tenderness and swelling of the neck.   DIAGNOSIS   Most of the time your health care provider can diagnose a cervical sprain by taking your history and doing a physical exam. Your health care provider will ask about previous neck injuries and any known neck  problems, such as arthritis in the neck. X-rays may be taken to find out if there are any other problems, such as with the bones of the neck. Other tests, such as a CT scan or MRI, may also be needed.   TREATMENT   Treatment depends on the severity of the cervical sprain. Mild sprains can be treated with rest, keeping the neck in place (immobilization), and pain medicines. Severe cervical sprains are immediately immobilized. Further treatment is done to help with pain, muscle spasms, and other symptoms and may include:   Medicines, such as pain relievers, numbing medicines, or muscle relaxants.    Physical therapy. This may involve stretching exercises, strengthening exercises, and posture training. Exercises and improved posture can help stabilize the neck, strengthen muscles, and help stop symptoms from returning.   HOME CARE INSTRUCTIONS    Put ice on the injured area.     Put ice in a plastic bag.     Place a towel between your skin and the bag.     Leave the ice on for 15-20 minutes, 3-4 times a day.    If your injury was severe, you may have been given a cervical collar to wear. A cervical collar is a two-piece collar designed to keep your neck from moving while it heals.      Do not remove the collar unless instructed by your health care provider.    If you have long hair, keep it outside of the collar.    Ask your health care provider before making any adjustments to your collar. Minor adjustments may be required over time to improve comfort and reduce pressure on your chin or on the back of your head.    Ifyou are allowed to remove the collar for cleaning or bathing, follow your health care provider's instructions on how to do so safely.    Keep your collar clean by wiping it with mild soap and water and drying it completely. If the collar you have been given includes removable pads, remove them every 1-2 days and hand wash them with soap and water. Allow them to air dry. They should be completely  dry before you wear them in the collar.    If you are allowed to remove the collar for cleaning and bathing, wash and dry the skin of your neck. Check your skin for irritation or sores. If you see any, tell your health care provider.    Do not drive while wearing the collar.    Only take over-the-counter or prescription medicines for pain, discomfort, or fever as directed by your health care provider.    Keep all follow-up appointments as directed by your health care provider.    Keep all physical therapy appointments as directed by your health care provider.    Make any needed adjustments to your workstation to promote good posture.    Avoid positions and activities that make your symptoms worse.    Warm up and stretch before being active to help prevent problems.   SEEK MEDICAL CARE IF:    Your pain is not controlled with medicine.    You are unable to decrease your pain medicine over time as planned.    Your activity level is not improving as expected.   SEEK IMMEDIATE MEDICAL CARE IF:    You develop any bleeding.   You develop stomach upset.   You have signs of an allergic reaction to your medicine.    Your symptoms get worse.    You develop new, unexplained symptoms.    You have numbness, tingling, weakness, or paralysis in any part of your body.   MAKE SURE YOU:    Understand these instructions.   Will watch your condition.   Will get help right away if you are not doing well or get worse.     This information is not intended to replace advice given to you by your health care provider. Make sure you discuss any questions you have with your health care provider.     Document Released: 02/23/2007 Document Revised: 05/03/2013 Document Reviewed: 11/03/2012  Elsevier Interactive Patient Education 2016 [employer]

## 2015-04-12 NOTE — Progress Notes (Signed)
HPI: Victoria Benjamin is a 69 y.o. female who presents to [place]  today for chief complaint of:  Chief Complaint  Patient presents with  . Neck Pain    . Location: L side of neck and arm pain  . Quality: Soreness, sharp pain on occasion . Duration: about a month . Timing: Worse with extension of the neck . Context: No injury but history of chronic neck pain. She was seen by dentist thinking this might be due to tooth or jaw pain. This is the shoulder she usually carries purse on, she has stopped doing that. Hx fusions in neck on opposite side of pain . Modifying factors: worse with extension and rotation to the left. Has taken Extra strength Tylenol.  . Assoc signs/symptoms: No numbness or shooting pain down arms   Past medical, social and family history reviewed: No past medical history on file. Past Surgical History  Procedure Laterality Date  . Neck surgery  2002  . Hand surgery  2006, 07  . Breast enhancement surgery    . Abdominal hysterectomy      partial   Social History  Substance Use Topics  . Smoking status: Former Research scientist (life sciences)  . Smokeless tobacco: Not on file  . Alcohol Use: Yes   Family History  Problem Relation Age of Onset  . Hypertension Mother   . Arthritis Mother     osteo  . Pulmonary embolism Mother   . Hyperlipidemia Sister     Current Outpatient Prescriptions  Medication Sig Dispense Refill  . aspirin 81 MG tablet per patient Take 81mg  by mouth daily.      Marland Kitchen buPROPion (WELLBUTRIN SR) 150 MG 12 hr tablet Take 1 tablet (150 mg total) by mouth 2 (two) times daily. 180 tablet 1  . omeprazole (PRILOSEC) 20 MG capsule Take 1 capsule (20 mg total) by mouth daily. 90 capsule 3  . pravastatin (PRAVACHOL) 80 MG tablet Take 1 tablet (80 mg total) by mouth daily. 90 tablet 3   No current facility-administered medications for this visit.   No Known Allergies    Review of Systems: CONSTITUTIONAL:  No  fever, no  chills HEAD/EYES/EARS/NOSE/THROAT: No headache, no vision change, no hearing change, No  sore throat CARDIAC: No chest pain RESPIRATORY: No  cough, no shortness of breath/wheeze MUSCULOSKELETAL: Neck pain as per history of present illness myalgia/arthralgia NEUROLOGIC: No weakness, no dizziness, no slurred speech    Exam:  BP 126/69 mmHg  Pulse 79  Ht 5\' 2"  (1.575 m) Constitutional: VSS, see above. General Appearance: alert, well-developed, well-nourished, NAD Eyes: Normal lids and conjunctive, non-icteric sclera, Respiratory: Normal respiratory effort. no wheeze, no rhonchi, no rales Cardiovascular: S1/S2 normal, no murmur, no rub/gallop auscultated. RRR.  Musculoskeletal: Gait normal. No clubbing/cyanosis of digits. Cervical spine: Limited range of motion to extension and rotation to last due to pain, positive paraspinal tenderness on the left cervical spine and trapezius muscle, tissue texture changes on right side of spine consistent with scar tissue from surgery Neurological: No cranial nerve deficit on limited exam. Motor and sensation intact and symmetric    No results found for this or any previous visit (from the past 72 hour(s)).  Procedure: OMT performed, myofascial release and muscle energy to cervical spine to some relief for patient  ASSESSMENT/PLAN: OMT as above, patient is welcome to come back for repeat treatments, suspect chronic muscle spasm, medications as below. If pain is not improved she would also recommend physical therapy, possible  repeat cervical spine imaging.  Neck pain - Plan: diclofenac sodium (VOLTAREN) 1 % GEL, naproxen (NAPROSYN) 500 MG tablet, cyclobenzaprine (FLEXERIL) 5 MG tablet  Somatic dysfunction of cervical region     Return if no better, appointmetn with Dr Madilyn Fireman or can call s for referral to physical therpay.

## 2015-04-12 NOTE — Progress Notes (Signed)
Patient refused weight. Brandon Scarbrough,CMA

## 2015-05-16 ENCOUNTER — Other Ambulatory Visit: Payer: Self-pay | Admitting: Family Medicine

## 2015-06-29 ENCOUNTER — Ambulatory Visit (INDEPENDENT_AMBULATORY_CARE_PROVIDER_SITE_OTHER): Payer: Medicare Other | Admitting: Family Medicine

## 2015-06-29 ENCOUNTER — Encounter: Payer: Self-pay | Admitting: Family Medicine

## 2015-06-29 VITALS — BP 118/67 | HR 75 | Ht 63.0 in | Wt 141.5 lb

## 2015-06-29 DIAGNOSIS — E785 Hyperlipidemia, unspecified: Secondary | ICD-10-CM

## 2015-06-29 DIAGNOSIS — F329 Major depressive disorder, single episode, unspecified: Secondary | ICD-10-CM | POA: Diagnosis not present

## 2015-06-29 DIAGNOSIS — R7301 Impaired fasting glucose: Secondary | ICD-10-CM | POA: Diagnosis not present

## 2015-06-29 DIAGNOSIS — M7741 Metatarsalgia, right foot: Secondary | ICD-10-CM | POA: Diagnosis not present

## 2015-06-29 DIAGNOSIS — F32A Depression, unspecified: Secondary | ICD-10-CM

## 2015-06-29 LAB — LIPID PANEL
CHOLESTEROL: 189 mg/dL (ref 125–200)
HDL: 54 mg/dL (ref >=46)
LDL CALC: 110 mg/dL (ref <130)
Total CHOL/HDL Ratio: 3.5 Ratio (ref <=5.0)
Triglycerides: 127 mg/dL (ref <150)
VLDL: 25 mg/dL (ref <30)

## 2015-06-29 LAB — BASIC METABOLIC PANEL WITH GFR
BUN: 13 mg/dL (ref 7–25)
CALCIUM: 9.5 mg/dL (ref 8.6–10.4)
CHLORIDE: 104 mmol/L (ref 98–110)
CO2: 26 mmol/L (ref 20–31)
CREATININE: 0.98 mg/dL (ref 0.50–0.99)
GFR, Est African American: 68 mL/min (ref >=60)
GFR, Est Non African American: 59 mL/min — ABNORMAL LOW (ref >=60)
Glucose, Bld: 100 mg/dL — ABNORMAL HIGH (ref 65–99)
Potassium: 4.2 mmol/L (ref 3.5–5.3)
Sodium: 138 mmol/L (ref 135–146)

## 2015-06-29 LAB — POCT GLYCOSYLATED HEMOGLOBIN (HGB A1C): Hemoglobin A1C: 5.8

## 2015-06-29 MED ORDER — ATORVASTATIN CALCIUM 40 MG PO TABS: 40.0000 mg | ORAL_TABLET | Freq: Every day | ORAL | Status: DC

## 2015-06-29 NOTE — Progress Notes (Signed)
   Subjective:    Patient ID: Victoria Benjamin, female    DOB: 06/16/1945, 70 y.o.   MRN: [id number]  HPI 70 year old female who comes in today for her six-month follow-up. Next  Impaired fasting glucose-no increased thirst or urination. Feels she feels like she is doing well with her diet overall.   Depression-she's currently on Wellbutrin.He is at the point that she would like to wean her medication.  Hyperlipidemia-currently on pravastatin tolerating it well without any side effects. She is currently on the 80 mg dose.  So she is also still complaining of foot pain on the right. She saw one of our sports medicine providers. Unfortunate she has not had any improvement in her pain. She would like to be referred back to Dr. Darnell Level off free over at [facility] who actually did surgery on her left foot.   Review of Systems     Objective:   Physical Exam  Constitutional: She is oriented to person, place, and time. She appears well-developed and well-nourished.  HENT:  Head: Normocephalic and atraumatic.  Cardiovascular: Normal rate, regular rhythm and normal heart sounds.   Pulmonary/Chest: Effort normal and breath sounds normal.  Neurological: She is alert and oriented to person, place, and time.  Skin: Skin is warm and dry.  Psychiatric: She has a normal mood and affect. Her behavior is normal.          Assessment & Plan:  Impaired fasting glucose- A1C of 5.8. Well controlled. F/U in 6 months.    Depression - she is ready to wean the medication.  Will taper. Had written information given.   Hyperlipidemia -  Discussed getting off of 80mg  simvastatin of increased risk of myalgias and side effects. We'll switch to atorvastatin 40 mg. New perception sent to the pharmacy. Recommend recheck lipids in 6 months.

## 2015-06-29 NOTE — Patient Instructions (Signed)
Decrease wellbutrin to once a day for 2 weeks, then take one every other day for one week and then stop.   Stop your simvastatin and start the atorvastatin.   We can recheck your cholesterol in 6 months.

## 2015-07-01 NOTE — Progress Notes (Signed)
Quick Note:  All labs are normal. ______ 

## 2015-07-19 DIAGNOSIS — G5761 Lesion of plantar nerve, right lower limb: Secondary | ICD-10-CM | POA: Diagnosis not present

## 2015-07-24 DIAGNOSIS — G8918 Other acute postprocedural pain: Secondary | ICD-10-CM | POA: Diagnosis not present

## 2015-07-24 DIAGNOSIS — G5761 Lesion of plantar nerve, right lower limb: Secondary | ICD-10-CM | POA: Diagnosis not present

## 2015-08-08 DIAGNOSIS — G5761 Lesion of plantar nerve, right lower limb: Secondary | ICD-10-CM | POA: Diagnosis not present

## 2015-08-08 DIAGNOSIS — Z4789 Encounter for other orthopedic aftercare: Secondary | ICD-10-CM | POA: Diagnosis not present

## 2015-09-25 DIAGNOSIS — L300 Nummular dermatitis: Secondary | ICD-10-CM | POA: Diagnosis not present

## 2015-09-25 DIAGNOSIS — L820 Inflamed seborrheic keratosis: Secondary | ICD-10-CM | POA: Diagnosis not present

## 2015-11-08 DIAGNOSIS — Z4789 Encounter for other orthopedic aftercare: Secondary | ICD-10-CM | POA: Diagnosis not present

## 2015-11-08 DIAGNOSIS — G5761 Lesion of plantar nerve, right lower limb: Secondary | ICD-10-CM | POA: Diagnosis not present

## 2015-11-19 ENCOUNTER — Encounter: Payer: Self-pay | Admitting: Family Medicine

## 2015-11-19 ENCOUNTER — Ambulatory Visit (INDEPENDENT_AMBULATORY_CARE_PROVIDER_SITE_OTHER): Payer: Medicare Other | Admitting: Family Medicine

## 2015-11-19 VITALS — BP 128/67 | HR 81 | Wt 138.0 lb

## 2015-11-19 DIAGNOSIS — M791 Myalgia: Secondary | ICD-10-CM | POA: Diagnosis not present

## 2015-11-19 DIAGNOSIS — T466X5A Adverse effect of antihyperlipidemic and antiarteriosclerotic drugs, initial encounter: Secondary | ICD-10-CM

## 2015-11-19 DIAGNOSIS — M609 Myositis, unspecified: Secondary | ICD-10-CM | POA: Diagnosis not present

## 2015-11-19 DIAGNOSIS — IMO0001 Reserved for inherently not codable concepts without codable children: Secondary | ICD-10-CM

## 2015-11-19 DIAGNOSIS — G720 Drug-induced myopathy: Secondary | ICD-10-CM | POA: Diagnosis not present

## 2015-11-19 DIAGNOSIS — T887XXA Unspecified adverse effect of drug or medicament, initial encounter: Secondary | ICD-10-CM | POA: Diagnosis not present

## 2015-11-19 DIAGNOSIS — R252 Cramp and spasm: Secondary | ICD-10-CM | POA: Diagnosis not present

## 2015-11-19 DIAGNOSIS — T50905A Adverse effect of unspecified drugs, medicaments and biological substances, initial encounter: Secondary | ICD-10-CM

## 2015-11-19 NOTE — Progress Notes (Addendum)
Subjective:    CC: Med S.E.   HPI: Hyperlipidemia - LIPITOR causing body aches and cramping.  She has been having more HA and lightheadedness.  She says she noticed the symptoms starting after she been on it for about a month. She is pretty sounds simvastatin 80 mg and had absolutely no problems. I had only switched her because I was worried about increased risk of myalgias on the high dose of simvastatin.   Past medical history, Surgical history, Family history not pertinant except as noted below, Social history, Allergies, and medications have been entered into the medical record, reviewed, and corrections made.   Review of Systems: No fevers, chills, night sweats, weight loss, chest pain, or shortness of breath.   Objective:    General: Well Developed, well nourished, and in no acute distress.  Neuro: Alert and oriented x3, extra-ocular muscles intact, sensation grossly intact.  HEENT: Normocephalic, atraumatic  Skin: Warm and dry, no rashes. Cardiac: Regular rate and rhythm, no murmurs rubs or gallops, no lower extremity edema.  Respiratory: Clear to auscultation bilaterally. Not using accessory muscles, speaking in full sentences. Ext: Tender over the right calf with Squeeze. Tender over her upper arms bilaterally.   Impression and Recommendations:   Medication S.E. - Stop the lipitor.  We'll get some lab work just to evaluate for elevated CK and liver enzymes. Stop the Lipitor. When she's feeling better, hopefully in a couple of days to a couple of weeks we can either restart simvastatin at a lower dose such as 40 mg or consider Livalo or Crestor though with Medicare cost may be an issue with these last 2.

## 2015-11-19 NOTE — Addendum Note (Signed)
Addended by: [name] D on: 11/19/2015 02:38 PM   Modules accepted: Miquel Dunn

## 2015-11-20 LAB — HEPATIC FUNCTION PANEL
ALBUMIN: 4.4 g/dL (ref 3.6–5.1)
ALT: 31 U/L — AB (ref 6–29)
AST: 24 U/L (ref 10–35)
Alkaline Phosphatase: 99 U/L (ref 33–130)
BILIRUBIN DIRECT: 0.1 mg/dL (ref <=0.2)
Indirect Bilirubin: 0.4 mg/dL (ref 0.2–1.2)
TOTAL PROTEIN: 6.9 g/dL (ref 6.1–8.1)
Total Bilirubin: 0.5 mg/dL (ref 0.2–1.2)

## 2015-11-20 LAB — CK: Total CK: 130 U/L (ref 7–177)

## 2015-11-28 ENCOUNTER — Other Ambulatory Visit: Payer: Self-pay | Admitting: Family Medicine

## 2015-11-28 DIAGNOSIS — Z1231 Encounter for screening mammogram for malignant neoplasm of breast: Secondary | ICD-10-CM

## 2015-12-03 ENCOUNTER — Telehealth: Payer: Self-pay

## 2015-12-03 NOTE — Telephone Encounter (Signed)
Halli called and states the pain has resolved. She is ready to start a lower dose or change in cholesterol lowering medication.

## 2015-12-04 MED ORDER — ROSUVASTATIN CALCIUM 10 MG PO TABS
10.0000 mg | ORAL_TABLET | Freq: Every day | ORAL | 5 refills | Status: DC
Start: 2015-12-04 — End: 2015-12-27

## 2015-12-04 NOTE — Telephone Encounter (Signed)
I will send over a prescription for generic Crestor

## 2015-12-05 ENCOUNTER — Ambulatory Visit (INDEPENDENT_AMBULATORY_CARE_PROVIDER_SITE_OTHER): Payer: Medicare Other

## 2015-12-05 DIAGNOSIS — Z1231 Encounter for screening mammogram for malignant neoplasm of breast: Secondary | ICD-10-CM

## 2015-12-27 ENCOUNTER — Other Ambulatory Visit: Payer: Self-pay

## 2015-12-27 MED ORDER — ROSUVASTATIN CALCIUM 10 MG PO TABS: 10.0000 mg | ORAL_TABLET | Freq: Every day | ORAL | 2 refills | Status: DC

## 2016-01-30 DIAGNOSIS — H2513 Age-related nuclear cataract, bilateral: Secondary | ICD-10-CM | POA: Diagnosis not present

## 2016-01-30 DIAGNOSIS — H5203 Hypermetropia, bilateral: Secondary | ICD-10-CM | POA: Diagnosis not present

## 2016-01-30 DIAGNOSIS — H04123 Dry eye syndrome of bilateral lacrimal glands: Secondary | ICD-10-CM | POA: Diagnosis not present

## 2016-02-28 ENCOUNTER — Ambulatory Visit (INDEPENDENT_AMBULATORY_CARE_PROVIDER_SITE_OTHER): Payer: Medicare Other | Admitting: Family Medicine

## 2016-02-28 DIAGNOSIS — Z23 Encounter for immunization: Secondary | ICD-10-CM | POA: Diagnosis not present

## 2016-02-28 MED ORDER — AMBULATORY NON FORMULARY MEDICATION: 0 refills | Status: DC

## 2016-02-28 NOTE — Progress Notes (Signed)
Agree with below. \Merrisa Skorupski, MD  

## 2016-02-28 NOTE — Progress Notes (Signed)
Patient is here for a flu vaccine. Denies any fever in last 24 hours. No previous adverse reactions to flu vaccine

## 2016-03-03 ENCOUNTER — Ambulatory Visit (INDEPENDENT_AMBULATORY_CARE_PROVIDER_SITE_OTHER): Payer: Medicare Other | Admitting: Family Medicine

## 2016-03-03 ENCOUNTER — Encounter: Payer: Self-pay | Admitting: Family Medicine

## 2016-03-03 DIAGNOSIS — H8110 Benign paroxysmal vertigo, unspecified ear: Secondary | ICD-10-CM | POA: Diagnosis not present

## 2016-03-03 MED ORDER — MECLIZINE HCL 25 MG PO TABS: 25.0000 mg | ORAL_TABLET | Freq: Three times a day (TID) | ORAL | 3 refills | Status: DC | PRN

## 2016-03-03 NOTE — Progress Notes (Signed)
       Victoria Benjamin is a 70 y.o. female who presents to [facility]: [facility] today for dizziness.  Patient yesterday developed intense head spinning sensation with head motion. This lasted 30-60 seconds at a time and happen repeatedly through the day. This is associated with nausea. Since then her symptoms have improved. She denies any personal history of vertigo but does not her sister has BPPV.  She denies any weakness or numbness or loss of function or change in vision.   No past medical history on file. Past Surgical History:  Procedure Laterality Date  . ABDOMINAL HYSTERECTOMY     partial  . BREAST ENHANCEMENT SURGERY    . HAND SURGERY  2006, 07  . NECK SURGERY  2002   Social History  Substance Use Topics  . Smoking status: Former Research scientist (life sciences)  . Smokeless tobacco: Not on file  . Alcohol use Yes   family history includes Arthritis in her mother; Hyperlipidemia in her sister; Hypertension in her mother; Pulmonary embolism in her mother.  ROS as above:  Medications: Current Outpatient Prescriptions  Medication Sig Dispense Refill  . AMBULATORY NON FORMULARY MEDICATION SHINGLES VACCINE  ZOSTAVAX 1 vial 0  . aspirin 81 MG tablet Take 81 mg by mouth daily.    Marland Kitchen omeprazole (PRILOSEC) 20 MG capsule Take 1 capsule (20 mg total) by mouth daily. 90 capsule 3  . rosuvastatin (CRESTOR) 10 MG tablet Take 1 tablet (10 mg total) by mouth daily. 90 tablet 2  . triamcinolone cream (KENALOG) 0.1 %      No current facility-administered medications for this visit.    Allergies  Allergen Reactions  . Lipitor [Atorvastatin] Other (See Comments)    Myalgias, lightheadedness, headaches, cramping    Health Maintenance Health Maintenance  Topic Date Due  . ZOSTAVAX  03/06/2006  . MAMMOGRAM  12/04/2017  . TETANUS/TDAP  08/31/2018  . COLONOSCOPY  01/16/2021  . INFLUENZA VACCINE   Completed  . DEXA SCAN  Completed  . Hepatitis C Screening  Completed  . PNA vac Low Risk Adult  Completed     Exam:  BP 127/65   Pulse 69   Temp 98.3 F (36.8 C) (Oral)   Orthostatic VS for the past 24 hrs:  BP- Lying Pulse- Lying BP- Sitting Pulse- Sitting BP- Standing at 0 minutes Pulse- Standing at 0 minutes  03/03/16 1604 132/71 64 127/65 69 114/71 80      Gen: Well NAD HEENT: EOMI,  MMM Tympanic membranes bilaterally Lungs: Normal work of breathing. CTABL Heart: RRR no MRG Abd: NABS, Soft. Nondistended, Nontender Exts: Brisk capillary refill, warm and well perfused.  Neuro alert and oriented normal speech and thought process balance strength sensation. Negative Dix-Hallpike test bilaterally.    No results found for this or any previous visit (from the past 72 hour(s)). No results found.    Assessment and Plan: 70 y.o. female with suspect resolving BPPV. Dix-Hallpike test is negative. Plan to treat with relative rest meclizine a referral to vestibular physical therapy. Return as needed.   No orders of the defined types were placed in this encounter.   Discussed warning signs or symptoms. Please see discharge instructions. Patient expresses understanding.

## 2016-03-03 NOTE — Patient Instructions (Addendum)
Thank you for coming in today. Return as needed.  Use meclizine as needed Attend vestibular physical therapy if not better  Call or go to the emergency room if you get worse, have trouble breathing, have chest pains, or palpitations.    Benign Positional Vertigo Vertigo is the feeling that you or your surroundings are moving when they are not. Benign positional vertigo is the most common form of vertigo. The cause of this condition is not serious (is benign). This condition is triggered by certain movements and positions (is positional). This condition can be dangerous if it occurs while you are doing something that could endanger you or others, such as driving.  CAUSES In many cases, the cause of this condition is not known. It may be caused by a disturbance in an area of the inner ear that helps your brain to sense movement and balance. This disturbance can be caused by a viral infection (labyrinthitis), head injury, or repetitive motion. RISK FACTORS This condition is more likely to develop in:  Women.  People who are 29 years of age or older. SYMPTOMS Symptoms of this condition usually happen when you move your head or your eyes in different directions. Symptoms may start suddenly, and they usually last for less than a minute. Symptoms may include:  Loss of balance and falling.  Feeling like you are spinning or moving.  Feeling like your surroundings are spinning or moving.  Nausea and vomiting.  Blurred vision.  Dizziness.  Involuntary eye movement (nystagmus). Symptoms can be mild and cause only slight annoyance, or they can be severe and interfere with daily life. Episodes of benign positional vertigo may return (recur) over time, and they may be triggered by certain movements. Symptoms may improve over time. DIAGNOSIS This condition is usually diagnosed by medical history and a physical exam of the head, neck, and ears. You may be referred to a health care provider who  specializes in ear, nose, and throat (ENT) problems (otolaryngologist) or a provider who specializes in disorders of the nervous system (neurologist). You may have additional testing, including:  MRI.  A CT scan.  Eye movement tests. Your health care provider may ask you to change positions quickly while he or she watches you for symptoms of benign positional vertigo, such as nystagmus. Eye movement may be tested with an electronystagmogram (ENG), caloric stimulation, the Dix-Hallpike test, or the roll test.  An electroencephalogram (EEG). This records electrical activity in your brain.  Hearing tests. TREATMENT Usually, your health care provider will treat this by moving your head in specific positions to adjust your inner ear back to normal. Surgery may be needed in severe cases, but this is rare. In some cases, benign positional vertigo may resolve on its own in 2-4 weeks. HOME CARE INSTRUCTIONS Safety  Move slowly.Avoid sudden body or head movements.  Avoid driving.  Avoid operating heavy machinery.  Avoid doing any tasks that would be dangerous to you or others if a vertigo episode would occur.  If you have trouble walking or keeping your balance, try using a cane for stability. If you feel dizzy or unstable, sit down right away.  Return to your normal activities as told by your health care provider. Ask your health care provider what activities are safe for you. General Instructions  Take over-the-counter and prescription medicines only as told by your health care provider.  Avoid certain positions or movements as told by your health care provider.  Drink enough fluid to keep your  urine clear or pale yellow.  Keep all follow-up visits as told by your health care provider. This is important. SEEK MEDICAL CARE IF:  You have a fever.  Your condition gets worse or you develop new symptoms.  Your family or friends notice any behavioral changes.  Your nausea or vomiting  gets worse.  You have numbness or a "pins and needles" sensation. SEEK IMMEDIATE MEDICAL CARE IF:  You have difficulty speaking or moving.  You are always dizzy.  You faint.  You develop severe headaches.  You have weakness in your legs or arms.  You have changes in your hearing or vision.  You develop a stiff neck.  You develop sensitivity to light.   This information is not intended to replace advice given to you by your health care provider. Make sure you discuss any questions you have with your health care provider.   Document Released: 02/03/2006 Document Revised: 01/17/2015 Document Reviewed: 08/21/2014 Elsevier Interactive Patient Education [employer].

## 2016-03-06 ENCOUNTER — Ambulatory Visit: Payer: Medicare Other | Admitting: Family Medicine

## 2016-07-10 ENCOUNTER — Ambulatory Visit: Payer: Medicare Other | Admitting: Family Medicine

## 2016-07-11 ENCOUNTER — Ambulatory Visit (INDEPENDENT_AMBULATORY_CARE_PROVIDER_SITE_OTHER): Payer: Medicare Other

## 2016-07-11 ENCOUNTER — Ambulatory Visit (INDEPENDENT_AMBULATORY_CARE_PROVIDER_SITE_OTHER): Payer: Medicare Other | Admitting: Sports Medicine

## 2016-07-11 DIAGNOSIS — M25561 Pain in right knee: Secondary | ICD-10-CM

## 2016-07-11 DIAGNOSIS — M25562 Pain in left knee: Secondary | ICD-10-CM

## 2016-07-11 DIAGNOSIS — M170 Bilateral primary osteoarthritis of knee: Secondary | ICD-10-CM | POA: Diagnosis not present

## 2016-07-11 DIAGNOSIS — G8929 Other chronic pain: Secondary | ICD-10-CM

## 2016-07-11 NOTE — Progress Notes (Signed)
   Subjective:    I'm seeing this patient as a consultation for:  Dr. [name]  CC: Right knee pain  HPI: This is a pleasant 71 year old female, she did well after a left knee injection 2 years ago, persistent relief, osteoarthritis. She is having similar pain in the right knee, mostly patellofemoral, moderate, persistent without radiation, no mechanical symptoms, no trauma.  Past medical history:  Negative.  See flowsheet/record as well for more information.  Surgical history: Negative.  See flowsheet/record as well for more information.  Family history: Negative.  See flowsheet/record as well for more information.  Social history: Negative.  See flowsheet/record as well for more information.  Allergies, and medications have been entered into the medical record, reviewed, and no changes needed.   Review of Systems: No headache, visual changes, nausea, vomiting, diarrhea, constipation, dizziness, abdominal pain, skin rash, fevers, chills, night sweats, weight loss, swollen lymph nodes, body aches, joint swelling, muscle aches, chest pain, shortness of breath, mood changes, visual or auditory hallucinations.   Objective:   General: Well Developed, well nourished, and in no acute distress.  Neuro/Psych: Alert and oriented x3, extra-ocular muscles intact, able to move all 4 extremities, sensation grossly intact. Skin: Warm and dry, no rashes noted.  Respiratory: Not using accessory muscles, speaking in full sentences, trachea midline.  Cardiovascular: Pulses palpable, no extremity edema. Abdomen: Does not appear distended. Right Knee: Normal to inspection with no erythema or effusion or obvious bony abnormalities. Tender to palpation over the patellar facets ROM normal in flexion and extension and lower leg rotation. Ligaments with solid consistent endpoints including ACL, PCL, LCL, MCL. Negative Mcmurray's and provocative meniscal tests. Non painful patellar  compression. Patellar and quadriceps tendons unremarkable. Hamstring and quadriceps strength is normal.  Procedure: Real-time Ultrasound Guided Injection of right knee Device: GE Logiq E  Verbal informed consent obtained.  Time-out conducted.  Noted no overlying erythema, induration, or other signs of local infection.  Skin prepped in a sterile fashion.  Local anesthesia: Topical Ethyl chloride.  With sterile technique and under real time ultrasound guidance:  1 mL Kenalog 40, 2 mL lidocaine, 2 mL bupivacaine injected easily Completed without difficulty  Pain immediately resolved suggesting accurate placement of the medication.  Advised to call if fevers/chills, erythema, induration, drainage, or persistent bleeding.  Images permanently stored and available for review in the ultrasound unit.  Impression: Technically successful ultrasound guided injection.  Impression and Recommendations:   This case required medical decision making of moderate complexity.  Primary osteoarthritis of both knees Left side is doing well 2 years after injection, right-sided injection today, return in one month. I would like baseline x-rays of the right knee as well.

## 2016-07-11 NOTE — Assessment & Plan Note (Signed)
Left side is doing well 2 years after injection, right-sided injection today, return in one month. I would like baseline x-rays of the right knee as well.

## 2016-07-22 ENCOUNTER — Encounter: Payer: Self-pay | Admitting: Family Medicine

## 2016-07-22 ENCOUNTER — Ambulatory Visit (INDEPENDENT_AMBULATORY_CARE_PROVIDER_SITE_OTHER): Payer: Medicare Other | Admitting: Family Medicine

## 2016-07-22 VITALS — BP 130/63 | HR 75 | Temp 98.1°F | Ht 63.0 in | Wt 148.0 lb

## 2016-07-22 DIAGNOSIS — B370 Candidal stomatitis: Secondary | ICD-10-CM | POA: Diagnosis not present

## 2016-07-22 DIAGNOSIS — Z23 Encounter for immunization: Secondary | ICD-10-CM | POA: Diagnosis not present

## 2016-07-22 DIAGNOSIS — B3781 Candidal esophagitis: Secondary | ICD-10-CM

## 2016-07-22 MED ORDER — AMBULATORY NON FORMULARY MEDICATION: 0 refills | Status: DC

## 2016-07-22 MED ORDER — NYSTATIN 100000 UNIT/ML MT SUSP: 5.0000 mL | Freq: Four times a day (QID) | OROMUCOSAL | 0 refills | Status: DC

## 2016-07-22 NOTE — Progress Notes (Signed)
   Subjective:    Patient ID: Victoria Benjamin, female    DOB: 26-Apr-1946, 71 y.o.   MRN: [id number]  HPI 21 you female with ST x 3 weeks and says her tongue has been really white as well. She has been trying to brush it. Says it was bleeding initially when she was brushes it so she stopped her ASA.  No fever. No pain with swallowing. Using warm salt water to gargle with. No other URI sxs. Has noticed a little more red and splotchy around her eyes.      Review of Systems     Objective:   Physical Exam  Constitutional: She is oriented to person, place, and time. She appears well-developed and well-nourished.  HENT:  Head: Normocephalic and atraumatic.  Right Ear: External ear normal.  Left Ear: External ear normal.  Nose: Nose normal.  Mouth/Throat: Oropharynx is clear and moist.  TMs and canals are clear. Tongue is clear with a few scattered erythematous spots. Some white coating or the posterior pharynx.   Eyes: Conjunctivae and EOM are normal. Pupils are equal, round, and reactive to light.  Neck: Neck supple. No thyromegaly present.  Cardiovascular: Normal rate, regular rhythm and normal heart sounds.   Pulmonary/Chest: Effort normal and breath sounds normal. She has no wheezes.  Lymphadenopathy:    She has no cervical adenopathy.  Neurological: She is alert and oriented to person, place, and time.  Skin: Skin is warm and dry.  Psychiatric: She has a normal mood and affect.      Assessment & Plan:  Thrush - will tx with nystatin. Call if not better in one week.

## 2016-07-29 ENCOUNTER — Encounter: Payer: Self-pay | Admitting: Sports Medicine

## 2016-07-29 ENCOUNTER — Ambulatory Visit (INDEPENDENT_AMBULATORY_CARE_PROVIDER_SITE_OTHER): Payer: Medicare Other | Admitting: Sports Medicine

## 2016-07-29 DIAGNOSIS — M170 Bilateral primary osteoarthritis of knee: Secondary | ICD-10-CM

## 2016-07-29 NOTE — Assessment & Plan Note (Signed)
Failure of right knee steroid injection at the last visit, starting Orthovisc. Return in one week for Orthovisc No. 2 of 4 into right knee.

## 2016-07-29 NOTE — Progress Notes (Signed)
  Subjective:    CC: Follow-up  HPI: Two and a half weeks ago we injected this pleasant 71 year old female's right knee, she did initially well but then had a recurrence of pain. Her left knee is doing well 2-1/2 years after injection. Pain is moderate, persistent, localized without radiation, no mechanical symptoms.  Past medical history:  Negative.  See flowsheet/record as well for more information.  Surgical history: Negative.  See flowsheet/record as well for more information.  Family history: Negative.  See flowsheet/record as well for more information.  Social history: Negative.  See flowsheet/record as well for more information.  Allergies, and medications have been entered into the medical record, reviewed, and no changes needed.   Review of Systems: No fevers, chills, night sweats, weight loss, chest pain, or shortness of breath.   Objective:    General: Well Developed, well nourished, and in no acute distress.  Neuro: Alert and oriented x3, extra-ocular muscles intact, sensation grossly intact.  HEENT: Normocephalic, atraumatic, pupils equal round reactive to light, neck supple, no masses, no lymphadenopathy, thyroid nonpalpable.  Skin: Warm and dry, no rashes. Cardiac: Regular rate and rhythm, no murmurs rubs or gallops, no lower extremity edema.  Respiratory: Clear to auscultation bilaterally. Not using accessory muscles, speaking in full sentences. Right Knee: Normal to inspection with no erythema or effusion or obvious bony abnormalities. Tender to palpation at the medial joint line ROM normal in flexion and extension and lower leg rotation. Ligaments with solid consistent endpoints including ACL, PCL, LCL, MCL. Negative Mcmurray's and provocative meniscal tests. Non painful patellar compression. Patellar and quadriceps tendons unremarkable. Hamstring and quadriceps strength is normal.  Procedure: Real-time Ultrasound Guided Injection of right knee Device: GE Logiq E    Verbal informed consent obtained.  Time-out conducted.  Noted no overlying erythema, induration, or other signs of local infection.  Skin prepped in a sterile fashion.  Local anesthesia: Topical Ethyl chloride.  With sterile technique and under real time ultrasound guidance:   30 mg/2 mL of OrthoVisc (sodium hyaluronate) in a prefilled syringe was injected easily into the knee through a 22-gauge needle. Completed without difficulty  Pain immediately resolved suggesting accurate placement of the medication.  Advised to call if fevers/chills, erythema, induration, drainage, or persistent bleeding.  Images permanently stored and available for review in the ultrasound unit.  Impression: Technically successful ultrasound guided injection.  Impression and Recommendations:    Primary osteoarthritis of both knees Failure of right knee steroid injection at the last visit, starting Orthovisc. Return in one week for Orthovisc No. 2 of 4 into right knee.

## 2016-08-04 ENCOUNTER — Encounter: Payer: Self-pay | Admitting: Sports Medicine

## 2016-08-04 ENCOUNTER — Ambulatory Visit (INDEPENDENT_AMBULATORY_CARE_PROVIDER_SITE_OTHER): Payer: Medicare Other | Admitting: Sports Medicine

## 2016-08-04 DIAGNOSIS — M170 Bilateral primary osteoarthritis of knee: Secondary | ICD-10-CM

## 2016-08-04 NOTE — Assessment & Plan Note (Signed)
Orthovisc No. 2 as above, return in one week for #3 of 4 into the right knee.

## 2016-08-04 NOTE — Progress Notes (Signed)

## 2016-08-05 ENCOUNTER — Ambulatory Visit: Payer: Medicare Other | Admitting: Sports Medicine

## 2016-08-11 ENCOUNTER — Ambulatory Visit: Payer: Medicare Other | Admitting: Sports Medicine

## 2016-08-13 ENCOUNTER — Encounter: Payer: Self-pay | Admitting: Sports Medicine

## 2016-08-13 ENCOUNTER — Ambulatory Visit (INDEPENDENT_AMBULATORY_CARE_PROVIDER_SITE_OTHER): Payer: Medicare Other | Admitting: Sports Medicine

## 2016-08-13 DIAGNOSIS — M170 Bilateral primary osteoarthritis of knee: Secondary | ICD-10-CM

## 2016-08-13 NOTE — Assessment & Plan Note (Signed)
Orthovisc injection #3 of 4 in the right knee, return in one week for #4 of 4.

## 2016-08-13 NOTE — Progress Notes (Signed)

## 2016-08-20 ENCOUNTER — Ambulatory Visit (INDEPENDENT_AMBULATORY_CARE_PROVIDER_SITE_OTHER): Payer: Medicare Other | Admitting: Sports Medicine

## 2016-08-20 DIAGNOSIS — M170 Bilateral primary osteoarthritis of knee: Secondary | ICD-10-CM | POA: Diagnosis not present

## 2016-08-20 NOTE — Progress Notes (Signed)

## 2016-08-20 NOTE — Assessment & Plan Note (Addendum)
Orthovisc injection #4 of 4 into the right knee, she is still having significant amounts of pain, I did advise that we add Celebrex, she will return in a month and let me know if she is agreeable to do this.  Ultimately she is trying to avoid all pharmacologic intervention.

## 2016-09-28 ENCOUNTER — Other Ambulatory Visit: Payer: Self-pay | Admitting: Family Medicine

## 2016-10-30 DIAGNOSIS — H2513 Age-related nuclear cataract, bilateral: Secondary | ICD-10-CM | POA: Diagnosis not present

## 2016-11-02 ENCOUNTER — Other Ambulatory Visit: Payer: Self-pay | Admitting: Family Medicine

## 2016-11-29 ENCOUNTER — Other Ambulatory Visit: Payer: Self-pay | Admitting: Family Medicine

## 2016-12-01 ENCOUNTER — Other Ambulatory Visit: Payer: Self-pay

## 2016-12-29 ENCOUNTER — Other Ambulatory Visit: Payer: Self-pay | Admitting: Family Medicine

## 2017-01-08 ENCOUNTER — Other Ambulatory Visit: Payer: Self-pay | Admitting: Family Medicine

## 2017-01-08 DIAGNOSIS — Z1231 Encounter for screening mammogram for malignant neoplasm of breast: Secondary | ICD-10-CM

## 2017-01-21 ENCOUNTER — Ambulatory Visit (INDEPENDENT_AMBULATORY_CARE_PROVIDER_SITE_OTHER): Payer: Medicare Other

## 2017-01-21 DIAGNOSIS — Z1231 Encounter for screening mammogram for malignant neoplasm of breast: Secondary | ICD-10-CM | POA: Diagnosis not present

## 2017-01-22 ENCOUNTER — Encounter: Payer: Self-pay | Admitting: Family Medicine

## 2017-01-22 ENCOUNTER — Ambulatory Visit (INDEPENDENT_AMBULATORY_CARE_PROVIDER_SITE_OTHER): Payer: Medicare Other | Admitting: Family Medicine

## 2017-01-22 VITALS — BP 124/67 | HR 68 | Ht 63.0 in | Wt 151.0 lb

## 2017-01-22 DIAGNOSIS — E785 Hyperlipidemia, unspecified: Secondary | ICD-10-CM

## 2017-01-22 DIAGNOSIS — G5602 Carpal tunnel syndrome, left upper limb: Secondary | ICD-10-CM | POA: Diagnosis not present

## 2017-01-22 DIAGNOSIS — R7301 Impaired fasting glucose: Secondary | ICD-10-CM | POA: Diagnosis not present

## 2017-01-22 DIAGNOSIS — Z23 Encounter for immunization: Secondary | ICD-10-CM | POA: Diagnosis not present

## 2017-01-22 LAB — COMPLETE METABOLIC PANEL WITH GFR
AG RATIO: 2 (calc) (ref 1.0–2.5)
ALBUMIN MSPROF: 4.5 g/dL (ref 3.6–5.1)
ALT: 24 U/L (ref 6–29)
AST: 26 U/L (ref 10–35)
Alkaline phosphatase (APISO): 76 U/L (ref 33–130)
BILIRUBIN TOTAL: 0.7 mg/dL (ref 0.2–1.2)
BUN / CREAT RATIO: 20 (calc) (ref 6–22)
BUN: 19 mg/dL (ref 7–25)
CHLORIDE: 105 mmol/L (ref 98–110)
CO2: 29 mmol/L (ref 20–32)
Calcium: 9.7 mg/dL (ref 8.6–10.4)
Creat: 0.96 mg/dL — ABNORMAL HIGH (ref 0.60–0.93)
GFR, EST AFRICAN AMERICAN: 69 mL/min/{1.73_m2} (ref >=60)
GFR, Est Non African American: 60 mL/min/{1.73_m2} (ref >=60)
GLOBULIN: 2.3 g/dL (ref 1.9–3.7)
GLUCOSE: 99 mg/dL (ref 65–99)
Potassium: 4.5 mmol/L (ref 3.5–5.3)
SODIUM: 141 mmol/L (ref 135–146)
TOTAL PROTEIN: 6.8 g/dL (ref 6.1–8.1)

## 2017-01-22 LAB — POCT GLYCOSYLATED HEMOGLOBIN (HGB A1C): Hemoglobin A1C: 6

## 2017-01-22 LAB — LIPID PANEL W/REFLEX DIRECT LDL
Cholesterol: 169 mg/dL (ref <200)
HDL: 44 mg/dL — ABNORMAL LOW (ref >50)
LDL CHOLESTEROL (CALC): 97 mg/dL
Non-HDL Cholesterol (Calc): 125 mg/dL (calc) (ref <130)
Total CHOL/HDL Ratio: 3.8 (calc) (ref <5.0)
Triglycerides: 181 mg/dL — ABNORMAL HIGH (ref <150)

## 2017-01-22 MED ORDER — ROSUVASTATIN CALCIUM 10 MG PO TABS
10.0000 mg | ORAL_TABLET | Freq: Every day | ORAL | 3 refills | Status: DC
Start: 2017-01-22 — End: 2018-01-20

## 2017-01-22 NOTE — Progress Notes (Signed)
Subjective:    CC: IFG  HPI: Impaired fasting glucose-no increased thirst or urination. No symptoms consistent with hypoglycemia.  Hyperlipidemia - Currently on Crestor 10 mg daily. Tolerating well without any side effects or myalgias.  Her carpal tunnel is flaring again on her left wrist.  She has had it for years. Surgery was rec at one point in time.   Past medical history, Surgical history, Family history not pertinant except as noted below, Social history, Allergies, and medications have been entered into the medical record, reviewed, and corrections made.    Objective:    General: Well Developed, well nourished, and in no acute distress.  Neuro: Alert and oriented x3, extra-ocular muscles intact, sensation grossly intact.  HEENT: Normocephalic, atraumatic  Skin: Warm and dry, no rashes. Cardiac: Regular rate and rhythm, no murmurs rubs or gallops, no lower extremity edema.  Respiratory: Clear to auscultation bilaterally. Not using accessory muscles, speaking in full sentences.   Impression and Recommendations:    IFG - Well controlled. A1C is up to 6.0. Work on low carb diet and cut out orange juice from the diet.  Continue with exercise. Walks her dog daily. Continue current regimen. Follow up in  6 months.   Hyperlipidemia - Continue current regimen. Due for liver enzymes and repeat lipid panel.  Carpal Tunnel - given wrist splint for the left wrist.    Jury duty note given.    Checking  on copay for Shingrix.

## 2017-02-10 DIAGNOSIS — L923 Foreign body granuloma of the skin and subcutaneous tissue: Secondary | ICD-10-CM | POA: Diagnosis not present

## 2017-02-10 DIAGNOSIS — L570 Actinic keratosis: Secondary | ICD-10-CM | POA: Diagnosis not present

## 2017-02-10 DIAGNOSIS — D485 Neoplasm of uncertain behavior of skin: Secondary | ICD-10-CM | POA: Diagnosis not present

## 2017-02-10 DIAGNOSIS — L821 Other seborrheic keratosis: Secondary | ICD-10-CM | POA: Diagnosis not present

## 2017-04-16 ENCOUNTER — Encounter: Payer: Self-pay | Admitting: Physician Assistant

## 2017-04-16 ENCOUNTER — Ambulatory Visit (INDEPENDENT_AMBULATORY_CARE_PROVIDER_SITE_OTHER): Payer: Medicare Other | Admitting: Physician Assistant

## 2017-04-16 VITALS — BP 115/69 | HR 70 | Temp 98.0°F | Resp 16

## 2017-04-16 DIAGNOSIS — R040 Epistaxis: Secondary | ICD-10-CM

## 2017-04-16 NOTE — Progress Notes (Signed)
HPI:                                                                Victoria Benjamin is a 71 y.o. female who presents to [facility]: [place] today for epistaxis  Epistaxis   The bleeding has been from the left nare. This is a recurrent problem. The current episode started 1 to 4 weeks ago (x 2 weeks). The problem occurs daily (1-5 episodes per day). The problem has been unchanged. The bleeding is associated with dry air. She has tried pressure for the symptoms. The treatment provided significant relief. There is no history of allergies, a bleeding disorder, colds, frequent nosebleeds or sinus problems.  Denies bleeding from gums or easy bruising. Able to control nosebleeds at home with pressure applied for 5-15 minutes.   No past medical history on file. Past Surgical History:  Procedure Laterality Date  . ABDOMINAL HYSTERECTOMY     partial  . AUGMENTATION MAMMAPLASTY    . BREAST ENHANCEMENT SURGERY    . BREAST EXCISIONAL BIOPSY Right   . HAND SURGERY  2006, 07  . NECK SURGERY  2002   Social History   Tobacco Use  . Smoking status: Former Research scientist (life sciences)  . Smokeless tobacco: Never Used  Substance Use Topics  . Alcohol use: Yes   family history includes Arthritis in her mother; Hyperlipidemia in her sister; Hypertension in her mother; Pulmonary embolism in her mother.  ROS: negative except as noted in the HPI  Medications: Current Outpatient Medications  Medication Sig Dispense Refill  . aspirin 81 MG tablet Take 81 mg by mouth daily.    Marland Kitchen omeprazole (PRILOSEC) 20 MG capsule Take 1 capsule (20 mg total) by mouth daily. 90 capsule 3  . rosuvastatin (CRESTOR) 10 MG tablet Take 1 tablet (10 mg total) by mouth daily. 90 tablet 3   No current facility-administered medications for this visit.    Allergies  Allergen Reactions  . Lipitor [Atorvastatin] Other (See Comments)    Myalgias, lightheadedness, headaches, cramping       Objective:   BP 115/69 (BP Location: Left Arm, Patient Position: Sitting, Cuff Size: Normal)   Pulse 70   Temp 98 F (36.7 C)   Resp 16   SpO2 96%  Gen:  alert, not ill-appearing, no distress, appropriate for age HEENT: head normocephalic without obvious abnormality, conjunctiva and cornea clear, wearing glasses, left anterior nasal septum oozing scant amount of bright red blood, trachea midline Pulm: Normal work of breathing, normal phonation Neuro: alert and oriented x 3, no tremor MSK: extremities atraumatic, normal gait and station Skin: intact, no rashes on exposed skin, no jaundice, no cyanosis   No results found for this or any previous visit (from the past 72 hour(s)). No results found.  Indication: Anterior epistaxis Medical necessity statement: On physical examination, there is active bleeding noted from the left anterior septum Consent: Discussed benefits and risks of procedure and verbal consent obtained Procedure: Patient was prepped for the procedure. Utilized an otoscope to assess and take note of the nare and nasal septum. Source of bleeding was cauterized with silver nitrate stick x 2. Hemostasis achieved Post procedure examination: shows hemostasis achieved. call the clinic or proceed to the ED if bleeding  recurs    Assessment and Plan: 71 y.o. female with   1. Acute anterior epistaxis - cauterized with silver nitrate as above. Checking platelets and clotting time - CBC w/Diff/Platelet - Protime-INR - APTT  Patient education and anticipatory guidance given Patient agrees with treatment plan Follow-up as needed if symptoms worsen or fail to improve  Darlyne Russian PA-C

## 2017-04-16 NOTE — Patient Instructions (Addendum)
- continue Aquafor/Vaseline applied to nare with q-tip twice daily - humidified air  - avoid nose blowing for the next week    Nosebleed, Adult A nosebleed is when blood comes out of the nose. Nosebleeds are common. Usually, they are not a sign of a serious condition. Nosebleeds can happen if a small blood vessel in your nose starts to bleed or if the lining of your nose (mucous membrane) cracks. They are commonly caused by:  Allergies.  Colds.  Picking your nose.  Blowing your nose too hard.  An injury from sticking an object into your nose or getting hit in the nose.  Dry or cold air.  Less common causes of nosebleeds include:  Toxic fumes.  Something abnormal in the nose or in the air-filled spaces in the bones of the face (sinuses).  Growths in the nose, such as polyps.  Medicines or conditions that cause blood to clot slowly.  Certain illnesses or procedures that irritate or dry out the nasal passages.  Follow these instructions at home: When you have a nosebleed:  Sit down and tilt your head slightly forward.  Use a clean towel or tissue to pinch your nostrils under the bony part of your nose. After 10 minutes, let go of your nose and see if bleeding starts again. Do not release pressure before that time. If there is still bleeding, repeat the pinching and holding for 10 minutes until the bleeding stops.  Do not place tissues or gauze in the nose to stop bleeding.  Avoid lying down and avoid tilting your head backward. That may make blood collect in the throat and cause gagging or coughing.  Use a nasal spray decongestant to help with a nosebleed as told by your health care provider.  Do not use petroleum jelly or mineral oil in your nose. It can drip into your lungs. After a nosebleed:  Avoid blowing your nose or sniffing for a number of hours.  Avoid straining, lifting, or bending at the waist for several days. You may resume other normal activities as you  are able.  Use saline spray or a humidifier as told by your health care provider.  Aspirinand blood thinners make bleeding more likely. If you are prescribed these medicines and you suffer from nosebleeds: ? Ask your health care provider if you should stop taking the medicines or if you should adjust the dose. ? Do not stop taking medicines that your health care provider has recommended unless told by your health care provider.  If your nosebleed was caused by dry mucous membranes, use over-the-counter saline nasal spray or gel. This will keep the mucous membranes moist and allow them to heal. If you must use a lubricant: ? Choose one that is water-soluble. ? Use only as much as you need and use it only as often as needed. ? Do not lie down until several hours after you use it. Contact a health care provider if:  You have a fever.  You get nosebleeds often or more often than usual.  You bruise very easily.  You have a nosebleed from having something stuck in your nose.  You have bleeding in your mouth.  You vomit or cough up brown material.  You have a nosebleed after you start a new medicine. Get help right away if:  You have a nosebleed after a fall or a head injury.  Your nosebleed does not go away after 20 minutes.  You feel dizzy or weak.  You  have unusual bleeding from other parts of your body.  You have unusual bruising on other parts of your body.  You become sweaty.  You vomit blood. This information is not intended to replace advice given to you by your health care provider. Make sure you discuss any questions you have with your health care provider. Document Released: 02/05/2005 Document Revised: 12/27/2015 Document Reviewed: 11/13/2015 Elsevier Interactive Patient Education  [employer].

## 2017-04-17 LAB — CBC WITH DIFFERENTIAL/PLATELET
BASOS ABS: 62 {cells}/uL (ref 0–200)
BASOS PCT: 1.1 %
EOS ABS: 269 {cells}/uL (ref 15–500)
Eosinophils Relative: 4.8 %
HEMATOCRIT: 39.6 % (ref 35.0–45.0)
HEMOGLOBIN: 13.6 g/dL (ref 11.7–15.5)
LYMPHS ABS: 1786 {cells}/uL (ref 850–3900)
MCH: 30.9 pg (ref 27.0–33.0)
MCHC: 34.3 g/dL (ref 32.0–36.0)
MCV: 90 fL (ref 80.0–100.0)
MPV: 10.8 fL (ref 7.5–12.5)
Monocytes Relative: 6 %
NEUTROS PCT: 56.2 %
Neutro Abs: 3147 cells/uL (ref 1500–7800)
PLATELETS: 207 10*3/uL (ref 140–400)
RBC: 4.4 10*6/uL (ref 3.80–5.10)
RDW: 11.6 % (ref 11.0–15.0)
TOTAL LYMPHOCYTE: 31.9 %
WBC: 5.6 10*3/uL (ref 3.8–10.8)
WBCMIX: 336 {cells}/uL (ref 200–950)

## 2017-04-17 LAB — PROTIME-INR
INR: 1
Prothrombin Time: 10.4 s (ref 9.0–11.5)

## 2017-04-17 LAB — APTT: aPTT: 29 s (ref 22–34)

## 2017-04-17 NOTE — Progress Notes (Signed)
Normal platelets and clotting time If nosebleed recurs, call and get an appointment on Dr. Mcneil Sober schedule so he can cauterize it

## 2017-07-23 ENCOUNTER — Encounter: Payer: Self-pay | Admitting: Family Medicine

## 2017-07-23 ENCOUNTER — Ambulatory Visit (INDEPENDENT_AMBULATORY_CARE_PROVIDER_SITE_OTHER): Payer: Medicare Other | Admitting: Family Medicine

## 2017-07-23 VITALS — BP 118/64 | HR 67 | Ht 63.0 in | Wt 145.0 lb

## 2017-07-23 DIAGNOSIS — F5101 Primary insomnia: Secondary | ICD-10-CM | POA: Diagnosis not present

## 2017-07-23 DIAGNOSIS — D509 Iron deficiency anemia, unspecified: Secondary | ICD-10-CM | POA: Diagnosis not present

## 2017-07-23 DIAGNOSIS — R7301 Impaired fasting glucose: Secondary | ICD-10-CM | POA: Diagnosis not present

## 2017-07-23 LAB — POCT GLYCOSYLATED HEMOGLOBIN (HGB A1C): HEMOGLOBIN A1C: 6

## 2017-07-23 NOTE — Progress Notes (Signed)
   Subjective:    Patient ID: Victoria Benjamin, female    DOB: July 07, 1945, 72 y.o.   MRN: [id number]  HPI  Impaired fasting glucose-no increased thirst or urination. No symptoms consistent with hypoglycemia. She has lost 6 lbs and is doing better. She is eating better and she is exercising more.   She is scheduled for surgery May 1st.  She is having her implants removed.  She has to go to the lab for a CBC.    She is still having a little bit of problem with sleep.  She can usually fall asleep but then says she will wake up in her mind starts going and has difficulty falling back asleep.  Melatonin is really not helping.   Review of Systems     Objective:   Physical Exam  Constitutional: She is oriented to person, place, and time. She appears well-developed and well-nourished.  HENT:  Head: Normocephalic and atraumatic.  Cardiovascular: Normal rate, regular rhythm and normal heart sounds.  Pulmonary/Chest: Effort normal and breath sounds normal.  Neurological: She is alert and oriented to person, place, and time.  Skin: Skin is warm and dry.  Psychiatric: She has a normal mood and affect. Her behavior is normal.       Assessment & Plan:  IFG - A1C of 6.0. Well controlled. Continue current regimen. Follow up in  6 months.   Insomnia-recommend trying valerian root since the melatonin is not really helping.  It comes in capsules and OT.  She is having her breast implants removed next month.  She is not having them replaced.

## 2017-09-09 DIAGNOSIS — T85828A Fibrosis due to other internal prosthetic devices, implants and grafts, initial encounter: Secondary | ICD-10-CM | POA: Diagnosis not present

## 2017-09-09 DIAGNOSIS — T8544XA Capsular contracture of breast implant, initial encounter: Secondary | ICD-10-CM | POA: Diagnosis not present

## 2018-01-20 ENCOUNTER — Other Ambulatory Visit: Payer: Self-pay | Admitting: Family Medicine

## 2018-01-20 DIAGNOSIS — H04123 Dry eye syndrome of bilateral lacrimal glands: Secondary | ICD-10-CM | POA: Diagnosis not present

## 2018-01-20 DIAGNOSIS — H2513 Age-related nuclear cataract, bilateral: Secondary | ICD-10-CM | POA: Diagnosis not present

## 2018-01-20 DIAGNOSIS — Z135 Encounter for screening for eye and ear disorders: Secondary | ICD-10-CM | POA: Diagnosis not present

## 2018-01-20 DIAGNOSIS — H5203 Hypermetropia, bilateral: Secondary | ICD-10-CM | POA: Diagnosis not present

## 2018-01-21 ENCOUNTER — Ambulatory Visit (INDEPENDENT_AMBULATORY_CARE_PROVIDER_SITE_OTHER): Payer: Medicare Other | Admitting: Family Medicine

## 2018-01-21 ENCOUNTER — Encounter: Payer: Self-pay | Admitting: Family Medicine

## 2018-01-21 DIAGNOSIS — Z23 Encounter for immunization: Secondary | ICD-10-CM | POA: Diagnosis not present

## 2018-01-21 DIAGNOSIS — E785 Hyperlipidemia, unspecified: Secondary | ICD-10-CM

## 2018-01-21 DIAGNOSIS — R7301 Impaired fasting glucose: Secondary | ICD-10-CM

## 2018-01-21 LAB — POCT GLYCOSYLATED HEMOGLOBIN (HGB A1C): Hemoglobin A1C: 6 % — AB (ref 4.0–5.6)

## 2018-01-21 MED ORDER — OMEPRAZOLE 20 MG PO CPDR: 20.0000 mg | DELAYED_RELEASE_CAPSULE | Freq: Every day | ORAL | 3 refills | Status: DC

## 2018-01-21 NOTE — Progress Notes (Signed)
Subjective:    CC: IFG   HPI:  Impaired fasting glucose-no increased thirst or urination. No symptoms consistent with hypoglycemia.  Hyperlipidemia-currently on Crestor 10 mg and tolerating well without any side effects or problems.  Did have her breast implants removed and the surgeon recommended that she wait until January to get her mammogram done.  Past medical history, Surgical history, Family history not pertinant except as noted below, Social history, Allergies, and medications have been entered into the medical record, reviewed, and corrections made.   Review of Systems: No fevers, chills, night sweats, weight loss, chest pain, or shortness of breath.   Objective:    General: Well Developed, well nourished, and in no acute distress.  Neuro: Alert and oriented x3, extra-ocular muscles intact, sensation grossly intact.  HEENT: Normocephalic, atraumatic  Skin: Warm and dry, no rashes. Cardiac: Regular rate and rhythm, no murmurs rubs or gallops, no lower extremity edema.  Respiratory: Clear to auscultation bilaterally. Not using accessory muscles, speaking in full sentences.   Impression and Recommendations:    IFG - Well controlled. Continue current regimen. Follow up in  6 months.    Hyperlipidemia-due to recheck lipid panel.  Plans on getting mammo in Jan since just had implants removed.  Dr. Lowell Bouton office to get a copy of her surgical report.  Could not find that we had that in her chart.  She is getting ready to leave for a trip tomorrow with her daughter to go to [place].

## 2018-01-22 ENCOUNTER — Other Ambulatory Visit: Payer: Self-pay | Admitting: *Deleted

## 2018-01-22 DIAGNOSIS — R7989 Other specified abnormal findings of blood chemistry: Secondary | ICD-10-CM

## 2018-01-22 LAB — COMPLETE METABOLIC PANEL WITH GFR
AG RATIO: 1.7 (calc) (ref 1.0–2.5)
ALT: 24 U/L (ref 6–29)
AST: 27 U/L (ref 10–35)
Albumin: 4.6 g/dL (ref 3.6–5.1)
Alkaline phosphatase (APISO): 83 U/L (ref 33–130)
BUN / CREAT RATIO: 14 (calc) (ref 6–22)
BUN: 15 mg/dL (ref 7–25)
CALCIUM: 10 mg/dL (ref 8.6–10.4)
CO2: 29 mmol/L (ref 20–32)
Chloride: 105 mmol/L (ref 98–110)
Creat: 1.04 mg/dL — ABNORMAL HIGH (ref 0.60–0.93)
GFR, EST NON AFRICAN AMERICAN: 54 mL/min/{1.73_m2} — AB (ref >=60)
GFR, Est African American: 63 mL/min/{1.73_m2} (ref >=60)
GLOBULIN: 2.7 g/dL (ref 1.9–3.7)
Glucose, Bld: 99 mg/dL (ref 65–99)
POTASSIUM: 4.6 mmol/L (ref 3.5–5.3)
SODIUM: 142 mmol/L (ref 135–146)
Total Bilirubin: 0.7 mg/dL (ref 0.2–1.2)
Total Protein: 7.3 g/dL (ref 6.1–8.1)

## 2018-01-22 LAB — LIPID PANEL
Cholesterol: 168 mg/dL (ref <200)
HDL: 47 mg/dL — AB (ref >50)
LDL Cholesterol (Calc): 95 mg/dL (calc)
NON-HDL CHOLESTEROL (CALC): 121 mg/dL (ref <130)
Total CHOL/HDL Ratio: 3.6 (calc) (ref <5.0)
Triglycerides: 159 mg/dL — ABNORMAL HIGH (ref <150)

## 2018-03-17 ENCOUNTER — Encounter: Payer: Self-pay | Admitting: Family Medicine

## 2018-03-17 DIAGNOSIS — H527 Unspecified disorder of refraction: Secondary | ICD-10-CM | POA: Diagnosis not present

## 2018-03-17 DIAGNOSIS — H25813 Combined forms of age-related cataract, bilateral: Secondary | ICD-10-CM | POA: Diagnosis not present

## 2018-03-17 DIAGNOSIS — H52223 Regular astigmatism, bilateral: Secondary | ICD-10-CM | POA: Diagnosis not present

## 2018-04-02 ENCOUNTER — Ambulatory Visit (INDEPENDENT_AMBULATORY_CARE_PROVIDER_SITE_OTHER): Payer: Medicare Other | Admitting: Family Medicine

## 2018-04-02 ENCOUNTER — Encounter: Payer: Self-pay | Admitting: Family Medicine

## 2018-04-02 VITALS — BP 129/68 | HR 87 | Temp 98.1°F | Wt 140.1 lb

## 2018-04-02 DIAGNOSIS — J0100 Acute maxillary sinusitis, unspecified: Secondary | ICD-10-CM

## 2018-04-02 MED ORDER — AZITHROMYCIN 250 MG PO TABS: 250.0000 mg | ORAL_TABLET | Freq: Every day | ORAL | 0 refills | Status: DC

## 2018-04-02 MED ORDER — IPRATROPIUM BROMIDE 0.06 % NA SOLN: 2.0000 | NASAL | 6 refills | Status: DC | PRN

## 2018-04-02 NOTE — Patient Instructions (Signed)
Thank you for coming in today. Continue the over the counter medicine.  Take atrovent nasal spray.  Continue tylenol  If worse fill and take azithromycin antibiotic.    Sinusitis, Adult Sinusitis is soreness and inflammation of your sinuses. Sinuses are hollow spaces in the bones around your face. Your sinuses are located:  Around your eyes.  In the middle of your forehead.  Behind your nose.  In your cheekbones.  Your sinuses and nasal passages are lined with a stringy fluid (mucus). Mucus normally drains out of your sinuses. When your nasal tissues become inflamed or swollen, the mucus can become trapped or blocked so air cannot flow through your sinuses. This allows bacteria, viruses, and funguses to grow, which leads to infection. Sinusitis can develop quickly and last for 7?10 days (acute) or for more than 12 weeks (chronic). Sinusitis often develops after a cold. What are the causes? This condition is caused by anything that creates swelling in the sinuses or stops mucus from draining, including:  Allergies.  Asthma.  Bacterial or viral infection.  Abnormally shaped bones between the nasal passages.  Nasal growths that contain mucus (nasal polyps).  Narrow sinus openings.  Pollutants, such as chemicals or irritants in the air.  A foreign object stuck in the nose.  A fungal infection. This is rare.  What increases the risk? The following factors may make you more likely to develop this condition:  Having allergies or asthma.  Having had a recent cold or respiratory tract infection.  Having structural deformities or blockages in your nose or sinuses.  Having a weak immune system.  Doing a lot of swimming or diving.  Overusing nasal sprays.  Smoking.  What are the signs or symptoms? The main symptoms of this condition are pain and a feeling of pressure around the affected sinuses. Other symptoms include:  Upper toothache.  Earache.  Headache.  Bad  breath.  Decreased sense of smell and taste.  A cough that may get worse at night.  Fatigue.  Fever.  Thick drainage from your nose. The drainage is often green and it may contain pus (purulent).  Stuffy nose or congestion.  Postnasal drip. This is when extra mucus collects in the throat or back of the nose.  Swelling and warmth over the affected sinuses.  Sore throat.  Sensitivity to light.  How is this diagnosed? This condition is diagnosed based on symptoms, a medical history, and a physical exam. To find out if your condition is acute or chronic, your health care provider may:  Look in your nose for signs of nasal polyps.  Tap over the affected sinus to check for signs of infection.  View the inside of your sinuses using an imaging device that has a light attached (endoscope).  If your health care provider suspects that you have chronic sinusitis, you may also:  Be tested for allergies.  Have a sample of mucus taken from your nose (nasal culture) and checked for bacteria.  Have a mucus sample examined to see if your sinusitis is related to an allergy.  If your sinusitis does not respond to treatment and it lasts longer than 8 weeks, you may have an MRI or CT scan to check your sinuses. These scans also help to determine how severe your infection is. In rare cases, a bone biopsy may be done to rule out more serious types of fungal sinus disease. How is this treated? Treatment for sinusitis depends on the cause and whether your condition  is chronic or acute. If a virus is causing your sinusitis, your symptoms will go away on their own within 10 days. You may be given medicines to relieve your symptoms, including:  Topical nasal decongestants. They shrink swollen nasal passages and let mucus drain from your sinuses.  Antihistamines. These drugs block inflammation that is triggered by allergies. This can help to ease swelling in your nose and sinuses.  Topical nasal  corticosteroids. These are nasal sprays that ease inflammation and swelling in your nose and sinuses.  Nasal saline washes. These rinses can help to get rid of thick mucus in your nose.  If your condition is caused by bacteria, you will be given an antibiotic medicine. If your condition is caused by a fungus, you will be given an antifungal medicine. Surgery may be needed to correct underlying conditions, such as narrow nasal passages. Surgery may also be needed to remove polyps. Follow these instructions at home: Medicines  Take, use, or apply over-the-counter and prescription medicines only as told by your health care provider. These may include nasal sprays.  If you were prescribed an antibiotic medicine, take it as told by your health care provider. Do not stop taking the antibiotic even if you start to feel better. Hydrate and Humidify  Drink enough water to keep your urine clear or pale yellow. Staying hydrated will help to thin your mucus.  Use a cool mist humidifier to keep the humidity level in your home above 50%.  Inhale steam for 10-15 minutes, 3-4 times a day or as told by your health care provider. You can do this in the bathroom while a hot shower is running.  Limit your exposure to cool or dry air. Rest  Rest as much as possible.  Sleep with your head raised (elevated).  Make sure to get enough sleep each night. General instructions  Apply a warm, moist washcloth to your face 3-4 times a day or as told by your health care provider. This will help with discomfort.  Wash your hands often with soap and water to reduce your exposure to viruses and other germs. If soap and water are not available, use hand sanitizer.  Do not smoke. Avoid being around people who are smoking (secondhand smoke).  Keep all follow-up visits as told by your health care provider. This is important. Contact a health care provider if:  You have a fever.  Your symptoms get worse.  Your  symptoms do not improve within 10 days. Get help right away if:  You have a severe headache.  You have persistent vomiting.  You have pain or swelling around your face or eyes.  You have vision problems.  You develop confusion.  Your neck is stiff.  You have trouble breathing. This information is not intended to replace advice given to you by your health care provider. Make sure you discuss any questions you have with your health care provider. Document Released: 04/28/2005 Document Revised: 12/23/2015 Document Reviewed: 02/21/2015 Elsevier Interactive Patient Education  [employer].

## 2018-04-02 NOTE — Progress Notes (Signed)
Victoria Benjamin is a 72 y.o. female who presents to [facility]: [facility] today for sinus pain and pressure congestion and postnasal drainage.  Symptoms present for about 3 days.  No fevers chills or trouble breathing.  No severe cough.  No vomiting or diarrhea.  Mild nausea.  Patient has tried Tylenol and Claritin-D which helped a bit.  She denies sick contacts.  She feels pretty well otherwise.   ROS as above:  Exam:  BP 129/68   Pulse 87   Temp 98.1 F (36.7 C) (Oral)   Wt 140 lb 1.6 oz (63.5 kg)   BMI 24.82 kg/m  Wt Readings from Last 5 Encounters:  04/02/18 140 lb 1.6 oz (63.5 kg)  01/21/18 143 lb (64.9 kg)  07/23/17 145 lb (65.8 kg)  01/22/17 151 lb (68.5 kg)  08/13/16 151 lb 8 oz (68.7 kg)    Gen: Well NAD HEENT: EOMI,  MMM posterior pharynx with cobblestoning.  Normal tympanic membranes bilaterally.  Nasal turbinates are mildly erythematous and inflamed with clear nasal discharge.'s maxillary and frontal sinuses are nontender. Lungs: Normal work of breathing. CTABL Heart: RRR no MRG Abd: NABS, Soft. Nondistended, Nontender Exts: Brisk capillary refill, warm and well perfused.   Lab and Radiology Results No results found for this or any previous visit (from the past 72 hour(s)). No results found.    Assessment and Plan: 72 y.o. female with sinusitis likely viral.  Discussed treatment options.  Continue over-the-counter medications and use Atrovent nasal spray.  Patient declined prednisone as a back-up plan.  Printed back-up azithromycin for use if patient worsens or develops second sickening.  She will not take it today.  Recheck as needed.   No orders of the defined types were placed in this encounter.  Meds ordered this encounter  Medications  . ipratropium (ATROVENT) 0.06 % nasal spray    Sig: Place 2 sprays into both nostrils every 4 (four) hours  as needed.    Dispense:  10 mL    Refill:  6  . azithromycin (ZITHROMAX) 250 MG tablet    Sig: Take 1 tablet (250 mg total) by mouth daily. Take first 2 tablets together, then 1 every day until finished.    Dispense:  6 tablet    Refill:  0     Historical information moved to improve visibility of documentation.  No past medical history on file. Past Surgical History:  Procedure Laterality Date  . ABDOMINAL HYSTERECTOMY     partial  . AUGMENTATION MAMMAPLASTY    . BREAST ENHANCEMENT SURGERY    . BREAST EXCISIONAL BIOPSY Right   . HAND SURGERY  2006, 07  . NECK SURGERY  2002   Social History   Tobacco Use  . Smoking status: Former Research scientist (life sciences)  . Smokeless tobacco: Never Used  Substance Use Topics  . Alcohol use: Yes   family history includes Arthritis in her mother; Hyperlipidemia in her sister; Hypertension in her mother; Pulmonary embolism in her mother.  Medications: Current Outpatient Medications  Medication Sig Dispense Refill  . omeprazole (PRILOSEC) 20 MG capsule Take 1 capsule (20 mg total) by mouth daily. 90 capsule 3  . rosuvastatin (CRESTOR) 10 MG tablet TAKE 1 TABLET(10 MG) BY MOUTH DAILY 90 tablet 3  . azithromycin (ZITHROMAX) 250 MG tablet Take 1 tablet (250 mg total) by mouth daily. Take first 2 tablets together, then 1 every day until finished. 6 tablet 0  . ipratropium (  ATROVENT) 0.06 % nasal spray Place 2 sprays into both nostrils every 4 (four) hours as needed. 10 mL 6   No current facility-administered medications for this visit.    Allergies  Allergen Reactions  . Lipitor [Atorvastatin] Other (See Comments)    Myalgias, lightheadedness, headaches, cramping     Discussed warning signs or symptoms. Please see discharge instructions. Patient expresses understanding.

## 2018-04-13 DIAGNOSIS — L821 Other seborrheic keratosis: Secondary | ICD-10-CM | POA: Diagnosis not present

## 2018-04-13 DIAGNOSIS — L570 Actinic keratosis: Secondary | ICD-10-CM | POA: Diagnosis not present

## 2018-04-13 DIAGNOSIS — Z86008 Personal history of in-situ neoplasm of other site: Secondary | ICD-10-CM | POA: Diagnosis not present

## 2018-04-19 ENCOUNTER — Encounter: Payer: Self-pay | Admitting: Sports Medicine

## 2018-04-19 ENCOUNTER — Ambulatory Visit (INDEPENDENT_AMBULATORY_CARE_PROVIDER_SITE_OTHER): Payer: Medicare Other | Admitting: Sports Medicine

## 2018-04-19 DIAGNOSIS — M79672 Pain in left foot: Secondary | ICD-10-CM

## 2018-04-19 NOTE — Progress Notes (Signed)
Subjective:    CC: Foot pain  HPI: Victoria Benjamin is a pleasant 72 year old female, for the past few days she has had severe pain, swelling and redness in her left foot, localized over the dorsum of her forefoot.  She put on a postop shoe she had around the house, and symptoms only improved slightly.  They are severe, persistent, localized without radiation.  No fevers, chills, no trauma.  I reviewed the past medical history, family history, social history, surgical history, and allergies today and no changes were needed.  Please see the problem list section below in epic for further details.  Past Medical History: No past medical history on file. Past Surgical History: Past Surgical History:  Procedure Laterality Date  . ABDOMINAL HYSTERECTOMY     partial  . AUGMENTATION MAMMAPLASTY    . BREAST ENHANCEMENT SURGERY    . BREAST EXCISIONAL BIOPSY Right   . HAND SURGERY  2006, 07  . NECK SURGERY  2002   Social History: Social History   Socioeconomic History  . Marital status: Married    Spouse name: Not on file  . Number of children: Not on file  . Years of education: Not on file  . Highest education level: Not on file  Occupational History  . Not on file  Social Needs  . Financial resource strain: Not on file  . Food insecurity:    Worry: Not on file    Inability: Not on file  . Transportation needs:    Medical: Not on file    Non-medical: Not on file  Tobacco Use  . Smoking status: Former Research scientist (life sciences)  . Smokeless tobacco: Never Used  Substance and Sexual Activity  . Alcohol use: Yes  . Drug use: No  . Sexual activity: Not on file  Lifestyle  . Physical activity:    Days per week: Not on file    Minutes per session: Not on file  . Stress: Not on file  Relationships  . Social connections:    Talks on phone: Not on file    Gets together: Not on file    Attends religious service: Not on file    Active member of club or organization: Not on file    Attends meetings of clubs  or organizations: Not on file    Relationship status: Not on file  Other Topics Concern  . Not on file  Social History Narrative  . Not on file   Family History: Family History  Problem Relation Age of Onset  . Hypertension Mother   . Arthritis Mother        osteo  . Pulmonary embolism Mother   . Hyperlipidemia Sister    Allergies: Allergies  Allergen Reactions  . Lipitor [Atorvastatin] Other (See Comments)    Myalgias, lightheadedness, headaches, cramping   Medications: See med rec.  Review of Systems: No fevers, chills, night sweats, weight loss, chest pain, or shortness of breath.   Objective:    General: Well Developed, well nourished, and in no acute distress.  Neuro: Alert and oriented x3, extra-ocular muscles intact, sensation grossly intact.  HEENT: Normocephalic, atraumatic, pupils equal round reactive to light, neck supple, no masses, no lymphadenopathy, thyroid nonpalpable.  Skin: Warm and dry, no rashes. Cardiac: Regular rate and rhythm, no murmurs rubs or gallops, no lower extremity edema.  Respiratory: Clear to auscultation bilaterally. Not using accessory muscles, speaking in full sentences. Left foot: Painful swollen and tender third MTP, moderately erythematous. Range of motion is full in all  directions. Strength is 5/5 in all directions. No hallux valgus. No pes cavus or pes planus. No abnormal callus noted. No pain over the navicular prominence, or base of fifth metatarsal. No tenderness to palpation of the calcaneal insertion of plantar fascia. No pain at the Achilles insertion. No pain over the calcaneal bursa. No pain of the retrocalcaneal bursa. No tenderness to palpation over the tarsals, metatarsals, or phalanges. No hallux rigidus or limitus. No tenderness palpation over interphalangeal joints. No pain with compression of the metatarsal heads. Neurovascularly intact distally.  Procedure: Real-time Ultrasound Guided Injection of  aspiration/injection of left third MTP Device: GE Logiq E  Verbal informed consent obtained.  Time-out conducted.  Noted no overlying erythema, induration, or other signs of local infection.  Skin prepped in a sterile fashion.  Local anesthesia: Topical Ethyl chloride.  With sterile technique and under real time ultrasound guidance: Aspirated scant amount of cloudy fluid, syringe switched and 1/2 cc Kenalog 40, 1/2 cc lidocaine injected easily Completed without difficulty  Pain immediately resolved suggesting accurate placement of the medication.  Advised to call if fevers/chills, erythema, induration, drainage, or persistent bleeding.  Images permanently stored and available for review in the ultrasound unit.  Impression: Technically successful ultrasound guided injection.  Impression and Recommendations:    Left third MTP synovitis Aspiration and injection, I did need to dilute the aspirate to provide enough volume for crystal analysis. X-rays of the foot. Return in a month. She does have a postop shoe that she can wear for about 1 week. ___________________________________________ Gwen Her. Dianah Field, M.D., ABFM., CAQSM. Primary Care and Sports Medicine [facility]  Adjunct Professor of [employer]

## 2018-04-19 NOTE — Assessment & Plan Note (Signed)
Aspiration and injection, I did need to dilute the aspirate to provide enough volume for crystal analysis. X-rays of the foot. Return in a month. She does have a postop shoe that she can wear for about 1 week.

## 2018-04-20 LAB — SYNOVIAL FLUID, CRYSTAL

## 2018-05-17 DIAGNOSIS — H52223 Regular astigmatism, bilateral: Secondary | ICD-10-CM | POA: Diagnosis not present

## 2018-05-17 DIAGNOSIS — R7989 Other specified abnormal findings of blood chemistry: Secondary | ICD-10-CM | POA: Diagnosis not present

## 2018-05-17 DIAGNOSIS — H25813 Combined forms of age-related cataract, bilateral: Secondary | ICD-10-CM | POA: Diagnosis not present

## 2018-05-18 LAB — BASIC METABOLIC PANEL
BUN: 14 mg/dL (ref 7–25)
CALCIUM: 10 mg/dL (ref 8.6–10.4)
CO2: 30 mmol/L (ref 20–32)
Chloride: 103 mmol/L (ref 98–110)
Creat: 0.9 mg/dL (ref 0.60–0.93)
Glucose, Bld: 98 mg/dL (ref 65–99)
POTASSIUM: 4.5 mmol/L (ref 3.5–5.3)
SODIUM: 140 mmol/L (ref 135–146)

## 2018-05-18 NOTE — Progress Notes (Signed)
All labs are normal. 

## 2018-05-20 DIAGNOSIS — I252 Old myocardial infarction: Secondary | ICD-10-CM | POA: Diagnosis not present

## 2018-05-20 DIAGNOSIS — H52223 Regular astigmatism, bilateral: Secondary | ICD-10-CM | POA: Diagnosis not present

## 2018-05-20 DIAGNOSIS — H527 Unspecified disorder of refraction: Secondary | ICD-10-CM | POA: Diagnosis not present

## 2018-05-20 DIAGNOSIS — E785 Hyperlipidemia, unspecified: Secondary | ICD-10-CM | POA: Diagnosis not present

## 2018-05-20 DIAGNOSIS — Z87891 Personal history of nicotine dependence: Secondary | ICD-10-CM | POA: Diagnosis not present

## 2018-05-20 DIAGNOSIS — H25812 Combined forms of age-related cataract, left eye: Secondary | ICD-10-CM | POA: Diagnosis not present

## 2018-05-20 DIAGNOSIS — H52222 Regular astigmatism, left eye: Secondary | ICD-10-CM | POA: Insufficient documentation

## 2018-05-20 DIAGNOSIS — J449 Chronic obstructive pulmonary disease, unspecified: Secondary | ICD-10-CM | POA: Diagnosis not present

## 2018-05-20 DIAGNOSIS — H25813 Combined forms of age-related cataract, bilateral: Secondary | ICD-10-CM | POA: Diagnosis not present

## 2018-05-20 DIAGNOSIS — K219 Gastro-esophageal reflux disease without esophagitis: Secondary | ICD-10-CM | POA: Diagnosis not present

## 2018-05-20 DIAGNOSIS — Z8673 Personal history of transient ischemic attack (TIA), and cerebral infarction without residual deficits: Secondary | ICD-10-CM | POA: Diagnosis not present

## 2018-05-21 DIAGNOSIS — Z961 Presence of intraocular lens: Secondary | ICD-10-CM | POA: Diagnosis not present

## 2018-05-21 DIAGNOSIS — H25811 Combined forms of age-related cataract, right eye: Secondary | ICD-10-CM | POA: Diagnosis not present

## 2018-05-21 DIAGNOSIS — H527 Unspecified disorder of refraction: Secondary | ICD-10-CM | POA: Diagnosis not present

## 2018-05-21 DIAGNOSIS — H52221 Regular astigmatism, right eye: Secondary | ICD-10-CM | POA: Diagnosis not present

## 2018-06-01 DIAGNOSIS — Z9842 Cataract extraction status, left eye: Secondary | ICD-10-CM | POA: Diagnosis not present

## 2018-06-01 DIAGNOSIS — Z961 Presence of intraocular lens: Secondary | ICD-10-CM | POA: Diagnosis not present

## 2018-06-01 DIAGNOSIS — Z884 Allergy status to anesthetic agent status: Secondary | ICD-10-CM | POA: Diagnosis not present

## 2018-06-01 DIAGNOSIS — Z79899 Other long term (current) drug therapy: Secondary | ICD-10-CM | POA: Diagnosis not present

## 2018-06-01 DIAGNOSIS — E785 Hyperlipidemia, unspecified: Secondary | ICD-10-CM | POA: Diagnosis not present

## 2018-06-01 DIAGNOSIS — Z87891 Personal history of nicotine dependence: Secondary | ICD-10-CM | POA: Diagnosis not present

## 2018-06-01 DIAGNOSIS — H527 Unspecified disorder of refraction: Secondary | ICD-10-CM | POA: Diagnosis not present

## 2018-06-01 DIAGNOSIS — K219 Gastro-esophageal reflux disease without esophagitis: Secondary | ICD-10-CM | POA: Diagnosis not present

## 2018-06-01 DIAGNOSIS — H52223 Regular astigmatism, bilateral: Secondary | ICD-10-CM | POA: Diagnosis not present

## 2018-06-01 DIAGNOSIS — H25811 Combined forms of age-related cataract, right eye: Secondary | ICD-10-CM | POA: Diagnosis not present

## 2018-06-01 DIAGNOSIS — H2511 Age-related nuclear cataract, right eye: Secondary | ICD-10-CM | POA: Diagnosis not present

## 2018-06-01 DIAGNOSIS — H2513 Age-related nuclear cataract, bilateral: Secondary | ICD-10-CM | POA: Diagnosis not present

## 2018-06-02 ENCOUNTER — Encounter: Payer: Self-pay | Admitting: Family Medicine

## 2018-06-02 DIAGNOSIS — Z961 Presence of intraocular lens: Secondary | ICD-10-CM | POA: Diagnosis not present

## 2018-06-02 DIAGNOSIS — H527 Unspecified disorder of refraction: Secondary | ICD-10-CM | POA: Diagnosis not present

## 2018-06-10 ENCOUNTER — Encounter: Payer: Self-pay | Admitting: Family Medicine

## 2018-06-10 NOTE — Progress Notes (Signed)
Error - duplicate

## 2018-07-22 ENCOUNTER — Other Ambulatory Visit: Payer: Self-pay

## 2018-07-22 ENCOUNTER — Ambulatory Visit (INDEPENDENT_AMBULATORY_CARE_PROVIDER_SITE_OTHER): Payer: Medicare Other | Admitting: Family Medicine

## 2018-07-22 ENCOUNTER — Encounter: Payer: Self-pay | Admitting: Family Medicine

## 2018-07-22 VITALS — BP 138/70 | HR 78 | Ht 63.0 in | Wt 139.0 lb

## 2018-07-22 DIAGNOSIS — R7301 Impaired fasting glucose: Secondary | ICD-10-CM

## 2018-07-22 DIAGNOSIS — J3489 Other specified disorders of nose and nasal sinuses: Secondary | ICD-10-CM

## 2018-07-22 DIAGNOSIS — R252 Cramp and spasm: Secondary | ICD-10-CM

## 2018-07-22 LAB — POCT GLYCOSYLATED HEMOGLOBIN (HGB A1C): Hemoglobin A1C: 6.1 % — AB (ref 4.0–5.6)

## 2018-07-22 MED ORDER — IPRATROPIUM BROMIDE 0.03 % NA SOLN: 2.0000 | Freq: Two times a day (BID) | NASAL | 2 refills | Status: DC

## 2018-07-22 NOTE — Patient Instructions (Signed)
We can try B6 30 mg once a day.

## 2018-07-22 NOTE — Progress Notes (Signed)
Subjective:    CC: glucose  HPI:  Impaired fasting glucose-no increased thirst or urination. No symptoms consistent with hypoglycemia. She feels like she stays pretty healthy.    She also c/o of muscle cramping in her legs, feet and even her neck has been going on for about 6 months.  No changes in medications.  Denies any recent changes.  She is very active.  She says she drinks lots of water every day.  She has also had a runny nose on the right side for several months.  She says it just comes and goes she is not really sure what triggers it but it can be quite embarrassing at times she wanted know if there was any type of nasal spray that she could use.  She denies any allergic symptoms.   Past medical history, Surgical history, Family history not pertinant except as noted below, Social history, Allergies, and medications have been entered into the medical record, reviewed, and corrections made.   Review of Systems: No fevers, chills, night sweats, weight loss, chest pain, or shortness of breath.   Objective:    General: Well Developed, well nourished, and in no acute distress.  Neuro: Alert and oriented x3, extra-ocular muscles intact, sensation grossly intact.  HEENT: Normocephalic, atraumatic  Skin: Warm and dry, no rashes. Cardiac: Regular rate and rhythm, no murmurs rubs or gallops, no lower extremity edema.  Respiratory: Clear to auscultation bilaterally. Not using accessory muscles, speaking in full sentences.   Impression and Recommendations:      IFG - Well controlled. Continue current regimen. Follow up in  6 months.    Muscle cramps -discussed possibly getting blood work just evaluate for abnormal potassium levels or anemia.  She declines and says that she will maybe check at the next time she comes in.  But we did discuss possibly supplementing with the 630 mg once a day in addition to gentle stretches and making sure she is well-hydrated.  Consider verapamil in the  future if we do end up testing labs and they are normal and the B6 is not helpful.  Runny nose/rhinorrhea- can try the atorvent spray.  Recommend using humidifier at night.  She says she Artie has 1 and she uses it.

## 2018-12-24 ENCOUNTER — Other Ambulatory Visit: Payer: Self-pay | Admitting: Family Medicine

## 2018-12-24 DIAGNOSIS — Z1231 Encounter for screening mammogram for malignant neoplasm of breast: Secondary | ICD-10-CM

## 2018-12-31 DIAGNOSIS — Z961 Presence of intraocular lens: Secondary | ICD-10-CM | POA: Diagnosis not present

## 2018-12-31 DIAGNOSIS — H35373 Puckering of macula, bilateral: Secondary | ICD-10-CM | POA: Diagnosis not present

## 2018-12-31 DIAGNOSIS — H26492 Other secondary cataract, left eye: Secondary | ICD-10-CM | POA: Diagnosis not present

## 2018-12-31 DIAGNOSIS — H26493 Other secondary cataract, bilateral: Secondary | ICD-10-CM | POA: Diagnosis not present

## 2018-12-31 DIAGNOSIS — H527 Unspecified disorder of refraction: Secondary | ICD-10-CM | POA: Diagnosis not present

## 2018-12-31 LAB — HM DIABETES EYE EXAM

## 2019-01-12 ENCOUNTER — Other Ambulatory Visit: Payer: Self-pay | Admitting: Family Medicine

## 2019-01-13 ENCOUNTER — Encounter: Payer: Self-pay | Admitting: Family Medicine

## 2019-01-20 ENCOUNTER — Other Ambulatory Visit: Payer: Self-pay

## 2019-01-20 ENCOUNTER — Ambulatory Visit (INDEPENDENT_AMBULATORY_CARE_PROVIDER_SITE_OTHER): Payer: Medicare Other

## 2019-01-20 DIAGNOSIS — Z1231 Encounter for screening mammogram for malignant neoplasm of breast: Secondary | ICD-10-CM

## 2019-01-24 ENCOUNTER — Other Ambulatory Visit: Payer: Self-pay

## 2019-01-24 ENCOUNTER — Encounter: Payer: Self-pay | Admitting: Family Medicine

## 2019-01-24 ENCOUNTER — Ambulatory Visit (INDEPENDENT_AMBULATORY_CARE_PROVIDER_SITE_OTHER): Payer: Medicare Other | Admitting: Family Medicine

## 2019-01-24 VITALS — BP 131/74 | HR 67 | Temp 98.3°F | Ht 63.0 in | Wt 142.0 lb

## 2019-01-24 DIAGNOSIS — E785 Hyperlipidemia, unspecified: Secondary | ICD-10-CM | POA: Diagnosis not present

## 2019-01-24 DIAGNOSIS — R7301 Impaired fasting glucose: Secondary | ICD-10-CM | POA: Diagnosis not present

## 2019-01-24 NOTE — Assessment & Plan Note (Addendum)
Previous was well controlled. Continue current regimen. Follow up in  6 month Lab Results  Component Value Date   HGBA1C 6.1 (A) 07/22/2018

## 2019-01-24 NOTE — Progress Notes (Signed)
Established Patient Office Visit  Subjective:  Patient ID: Victoria Benjamin, female    DOB: 09/12/1945  Age: 73 y.o. MRN: [id number]  CC:  Chief Complaint  Patient presents with  . Follow-up    HPI Victoria Benjamin presents for   Impaired fasting glucose-no increased thirst or urination. No symptoms consistent with hypoglycemia.  Follow-up hyperlipidemia-tolerating statin well without any side effects or myalgias.  Due to repeat lipid and liver enzymes.  History reviewed. No pertinent past medical history.  Past Surgical History:  Procedure Laterality Date  . ABDOMINAL HYSTERECTOMY     partial  . BREAST ENHANCEMENT SURGERY    . BREAST EXCISIONAL BIOPSY Right   . CATARACT EXTRACTION Right   . HAND SURGERY  2006, 07  . NECK SURGERY  2002    Family History  Problem Relation Age of Onset  . Hypertension Mother   . Arthritis Mother        osteo  . Pulmonary embolism Mother   . Hyperlipidemia Sister     Social History   Socioeconomic History  . Marital status: Married    Spouse name: Not on file  . Number of children: Not on file  . Years of education: Not on file  . Highest education level: Not on file  Occupational History  . Not on file  Social Needs  . Financial resource strain: Not on file  . Food insecurity    Worry: Not on file    Inability: Not on file  . Transportation needs    Medical: Not on file    Non-medical: Not on file  Tobacco Use  . Smoking status: Former Research scientist (life sciences)  . Smokeless tobacco: Never Used  Substance and Sexual Activity  . Alcohol use: Yes  . Drug use: No  . Sexual activity: Not on file  Lifestyle  . Physical activity    Days per week: Not on file    Minutes per session: Not on file  . Stress: Not on file  Relationships  . Social Herbalist on phone: Not on file    Gets together: Not on file    Attends religious service: Not on file    Active member of club or organization: Not on file    Attends meetings of  clubs or organizations: Not on file    Relationship status: Not on file  . Intimate partner violence    Fear of current or ex partner: Not on file    Emotionally abused: Not on file    Physically abused: Not on file    Forced sexual activity: Not on file  Other Topics Concern  . Not on file  Social History Narrative  . Not on file    Outpatient Medications Prior to Visit  Medication Sig Dispense Refill  . calcium carbonate (OSCAL) 1500 (600 Ca) MG TABS tablet Take 600 mg of elemental calcium by mouth daily with breakfast.    . ipratropium (ATROVENT) 0.03 % nasal spray Place 2 sprays into both nostrils every 12 (twelve) hours. 30 mL 2  . omeprazole (PRILOSEC) 20 MG capsule TAKE 1 CAPSULE(20 MG) BY MOUTH DAILY 90 capsule 3  . rosuvastatin (CRESTOR) 10 MG tablet TAKE 1 TABLET(10 MG) BY MOUTH DAILY 90 tablet 3   No facility-administered medications prior to visit.     Allergies  Allergen Reactions  . Lipitor [Atorvastatin] Other (See Comments)    Myalgias, lightheadedness, headaches, cramping    ROS Review of Systems  Objective:    Physical Exam  Constitutional: She is oriented to person, place, and time. She appears well-developed and well-nourished.  HENT:  Head: Normocephalic and atraumatic.  Cardiovascular: Normal rate, regular rhythm and normal heart sounds.  Pulmonary/Chest: Effort normal and breath sounds normal.  Neurological: She is alert and oriented to person, place, and time.  Skin: Skin is warm and dry.  Psychiatric: She has a normal mood and affect. Her behavior is normal.    BP 131/74   Pulse 67   Temp 98.3 F (36.8 C) (Oral)   Ht 5\' 3"  (1.6 m)   Wt 142 lb (64.4 kg)   BMI 25.15 kg/m  Wt Readings from Last 3 Encounters:  01/24/19 142 lb (64.4 kg)  07/22/18 139 lb (63 kg)  04/02/18 140 lb 1.6 oz (63.5 kg)     There are no preventive care reminders to display for this patient.  There are no preventive care reminders to display for this  patient.  Lab Results  Component Value Date   TSH 1.505 10/25/2008   Lab Results  Component Value Date   WBC 5.6 04/16/2017   HGB 13.6 04/16/2017   HCT 39.6 04/16/2017   MCV 90.0 04/16/2017   PLT 207 04/16/2017   Lab Results  Component Value Date   NA 140 05/17/2018   K 4.5 05/17/2018   CO2 30 05/17/2018   GLUCOSE 98 05/17/2018   BUN 14 05/17/2018   CREATININE 0.90 05/17/2018   BILITOT 0.7 01/21/2018   ALKPHOS 99 11/19/2015   AST 27 01/21/2018   ALT 24 01/21/2018   PROT 7.3 01/21/2018   ALBUMIN 4.4 11/19/2015   CALCIUM 10.0 05/17/2018   Lab Results  Component Value Date   CHOL 168 01/21/2018   Lab Results  Component Value Date   HDL 47 (L) 01/21/2018   Lab Results  Component Value Date   LDLCALC 95 01/21/2018   Lab Results  Component Value Date   TRIG 159 (H) 01/21/2018   Lab Results  Component Value Date   CHOLHDL 3.6 01/21/2018   Lab Results  Component Value Date   HGBA1C 6.1 (A) 07/22/2018      Assessment & Plan:   Problem List Items Addressed This Visit      Endocrine   IFG (impaired fasting glucose) - Primary    Previous was well controlled. Continue current regimen. Follow up in  6 month Lab Results  Component Value Date   HGBA1C 6.1 (A) 07/22/2018         Relevant Orders   CBC   COMPLETE METABOLIC PANEL WITH GFR   Lipid panel   Hemoglobin A1c     Other   Hyperlipidemia    Due to recheck lipid panel.  Continue with Crestor.      Relevant Orders   CBC   COMPLETE METABOLIC PANEL WITH GFR   Lipid panel   Hemoglobin A1c     Flu vaccine given today.      No orders of the defined types were placed in this encounter.   Follow-up: Return in about 6 months (around 07/24/2019) for pre-diabetes .    [name], MD

## 2019-01-24 NOTE — Assessment & Plan Note (Addendum)
Due to recheck lipid panel.  Continue with Crestor.

## 2019-01-25 LAB — CBC
HCT: 41.7 % (ref 35.0–45.0)
Hemoglobin: 14.1 g/dL (ref 11.7–15.5)
MCH: 31.7 pg (ref 27.0–33.0)
MCHC: 33.8 g/dL (ref 32.0–36.0)
MCV: 93.7 fL (ref 80.0–100.0)
MPV: 10.7 fL (ref 7.5–12.5)
Platelets: 209 10*3/uL (ref 140–400)
RBC: 4.45 10*6/uL (ref 3.80–5.10)
RDW: 12.6 % (ref 11.0–15.0)
WBC: 5.3 10*3/uL (ref 3.8–10.8)

## 2019-01-25 LAB — COMPLETE METABOLIC PANEL WITH GFR
AG Ratio: 1.8 (calc) (ref 1.0–2.5)
ALT: 21 U/L (ref 6–29)
AST: 25 U/L (ref 10–35)
Albumin: 4.4 g/dL (ref 3.6–5.1)
Alkaline phosphatase (APISO): 68 U/L (ref 37–153)
BUN/Creatinine Ratio: 14 (calc) (ref 6–22)
BUN: 14 mg/dL (ref 7–25)
CO2: 31 mmol/L (ref 20–32)
Calcium: 10.3 mg/dL (ref 8.6–10.4)
Chloride: 105 mmol/L (ref 98–110)
Creat: 0.97 mg/dL — ABNORMAL HIGH (ref 0.60–0.93)
GFR, Est African American: 68 mL/min/{1.73_m2} (ref >=60)
GFR, Est Non African American: 58 mL/min/{1.73_m2} — ABNORMAL LOW (ref >=60)
Globulin: 2.5 g/dL (calc) (ref 1.9–3.7)
Glucose, Bld: 96 mg/dL (ref 65–99)
Potassium: 4.5 mmol/L (ref 3.5–5.3)
Sodium: 143 mmol/L (ref 135–146)
Total Bilirubin: 0.8 mg/dL (ref 0.2–1.2)
Total Protein: 6.9 g/dL (ref 6.1–8.1)

## 2019-01-25 LAB — LIPID PANEL
Cholesterol: 168 mg/dL (ref <200)
HDL: 46 mg/dL — ABNORMAL LOW (ref >=50)
LDL Cholesterol (Calc): 93 mg/dL (calc)
Non-HDL Cholesterol (Calc): 122 mg/dL (calc) (ref <130)
Total CHOL/HDL Ratio: 3.7 (calc) (ref <5.0)
Triglycerides: 202 mg/dL — ABNORMAL HIGH (ref <150)

## 2019-01-25 LAB — HEMOGLOBIN A1C
Hgb A1c MFr Bld: 5.7 % of total Hgb — ABNORMAL HIGH (ref <5.7)
Mean Plasma Glucose: 117 (calc)
eAG (mmol/L): 6.5 (calc)

## 2019-03-17 ENCOUNTER — Other Ambulatory Visit: Payer: Self-pay

## 2019-03-17 ENCOUNTER — Ambulatory Visit (INDEPENDENT_AMBULATORY_CARE_PROVIDER_SITE_OTHER): Payer: Medicare Other | Admitting: Sports Medicine

## 2019-03-17 ENCOUNTER — Ambulatory Visit (INDEPENDENT_AMBULATORY_CARE_PROVIDER_SITE_OTHER): Payer: Medicare Other

## 2019-03-17 DIAGNOSIS — M1712 Unilateral primary osteoarthritis, left knee: Secondary | ICD-10-CM | POA: Diagnosis not present

## 2019-03-17 DIAGNOSIS — M170 Bilateral primary osteoarthritis of knee: Secondary | ICD-10-CM | POA: Diagnosis not present

## 2019-03-17 DIAGNOSIS — M1711 Unilateral primary osteoarthritis, right knee: Secondary | ICD-10-CM | POA: Diagnosis not present

## 2019-03-17 MED ORDER — TRAMADOL HCL 50 MG PO TABS: 50.0000 mg | ORAL_TABLET | Freq: Three times a day (TID) | ORAL | 0 refills | Status: DC | PRN

## 2019-03-17 NOTE — Progress Notes (Signed)
Subjective:    CC: Right knee pain  HPI: Victoria Benjamin returns, she is a pleasant 73 year old female with known bilateral knee osteoarthritis, we finished a series of Orthovisc approximately 2-1/2 years ago, she did extremely well.  More recently she is having increasing pain in the anterior medial aspect of her right knee, she endorses minor trauma, she bumped it on something about a month or 2 ago.  Pain is moderate, persistent, localized without radiation.  I reviewed the past medical history, family history, social history, surgical history, and allergies today and no changes were needed.  Please see the problem list section below in epic for further details.  Past Medical History: No past medical history on file. Past Surgical History: Past Surgical History:  Procedure Laterality Date  . ABDOMINAL HYSTERECTOMY     partial  . BREAST ENHANCEMENT SURGERY    . BREAST EXCISIONAL BIOPSY Right   . CATARACT EXTRACTION Right   . HAND SURGERY  2006, 07  . NECK SURGERY  2002   Social History: Social History   Socioeconomic History  . Marital status: Married    Spouse name: Not on file  . Number of children: Not on file  . Years of education: Not on file  . Highest education level: Not on file  Occupational History  . Not on file  Social Needs  . Financial resource strain: Not on file  . Food insecurity    Worry: Not on file    Inability: Not on file  . Transportation needs    Medical: Not on file    Non-medical: Not on file  Tobacco Use  . Smoking status: Former Research scientist (life sciences)  . Smokeless tobacco: Never Used  Substance and Sexual Activity  . Alcohol use: Yes  . Drug use: No  . Sexual activity: Not on file  Lifestyle  . Physical activity    Days per week: Not on file    Minutes per session: Not on file  . Stress: Not on file  Relationships  . Social Herbalist on phone: Not on file    Gets together: Not on file    Attends religious service: Not on file    Active  member of club or organization: Not on file    Attends meetings of clubs or organizations: Not on file    Relationship status: Not on file  Other Topics Concern  . Not on file  Social History Narrative  . Not on file   Family History: Family History  Problem Relation Age of Onset  . Hypertension Mother   . Arthritis Mother        osteo  . Pulmonary embolism Mother   . Hyperlipidemia Sister    Allergies: Allergies  Allergen Reactions  . Lipitor [Atorvastatin] Other (See Comments)    Myalgias, lightheadedness, headaches, cramping   Medications: See med rec.  Review of Systems: No fevers, chills, night sweats, weight loss, chest pain, or shortness of breath.   Objective:    General: Well Developed, well nourished, and in no acute distress.  Neuro: Alert and oriented x3, extra-ocular muscles intact, sensation grossly intact.  HEENT: Normocephalic, atraumatic, pupils equal round reactive to light, neck supple, no masses, no lymphadenopathy, thyroid nonpalpable.  Skin: Warm and dry, no rashes. Cardiac: Regular rate and rhythm, no murmurs rubs or gallops, no lower extremity edema.  Respiratory: Clear to auscultation bilaterally. Not using accessory muscles, speaking in full sentences. Right knee: Normal to inspection with no erythema or effusion  or obvious bony abnormalities. Tender to palpation at the medial joint line of the patellar facets. ROM normal in flexion and extension and lower leg rotation. Ligaments with solid consistent endpoints including ACL, PCL, LCL, MCL. Negative Mcmurray's and provocative meniscal tests. Non painful patellar compression. Patellar and quadriceps tendons unremarkable. Hamstring and quadriceps strength is normal.  Impression and Recommendations:    Primary osteoarthritis of both knees We finished a series of Orthovisc 2-1/2 years ago, she had good relief until recently, now having recurrence of pain in the right knee. Adding some tramadol  and we will get her approved again for Orthovisc. Updated x-rays.   ___________________________________________ Gwen Her. Dianah Field, M.D., ABFM., CAQSM. Primary Care and Sports Medicine [facility]  Adjunct Professor of [place] [employer] [facility] [employer] [facility] [employer]

## 2019-03-17 NOTE — Assessment & Plan Note (Signed)
We finished a series of Orthovisc 2-1/2 years ago, she had good relief until recently, now having recurrence of pain in the right knee. Adding some tramadol and we will get her approved again for Orthovisc. Updated x-rays.

## 2019-03-21 ENCOUNTER — Telehealth: Payer: Self-pay | Admitting: Sports Medicine

## 2019-03-21 NOTE — Telephone Encounter (Signed)
-----   Message from Tasia Catchings, [place] sent at 03/18/2019  1:55 PM EST ----- Information has been submitted to Orthovisc and awaiting determination.   ----- Message ----- From: Silverio Decamp, MD Sent: 03/17/2019   8:54 AM EST To: Tasia Catchings, CMA  Orthovisc approval please, right knee only, did well 2-1/2 years ago. ___________________________________________Thomas Lenna Sciara. Dianah Field, M.D., ABFM., CAQSM.[facility] Care and [facility] MedicineC[facility] KernersvilleAdjunct Professor of [place] of [facility] of Medicine

## 2019-03-21 NOTE — Telephone Encounter (Signed)
I have left a message for the patient to call back about her estimated cost of the injections.

## 2019-03-28 NOTE — Telephone Encounter (Signed)
I called the patient and gave her an estimated cost of the injections and she is ok with getting the right done. She is now saying her left knee is hurting and wants to know its ok to get authorization for the left knee. Please advise.

## 2019-03-28 NOTE — Telephone Encounter (Signed)
Per Hildred Alamin and patient insurance the authorization will have to be completed for the other knee.

## 2019-03-28 NOTE — Telephone Encounter (Signed)
I do not see why we cannot just do both with a single authorization.

## 2019-03-29 NOTE — Telephone Encounter (Signed)
Received authorization for the other knee and patient is agreeable to estimated cost. Follow up scheduled for 03/31/2019.

## 2019-03-31 ENCOUNTER — Ambulatory Visit (INDEPENDENT_AMBULATORY_CARE_PROVIDER_SITE_OTHER): Payer: Medicare Other | Admitting: Sports Medicine

## 2019-03-31 ENCOUNTER — Other Ambulatory Visit: Payer: Self-pay

## 2019-03-31 DIAGNOSIS — M170 Bilateral primary osteoarthritis of knee: Secondary | ICD-10-CM | POA: Diagnosis not present

## 2019-03-31 NOTE — Assessment & Plan Note (Addendum)
Orthovisc No. 1 of 4 into both knees, return in 1 week for # 2 of 4. Had trouble tolerating tramadol, discontinue this.

## 2019-03-31 NOTE — Progress Notes (Signed)
   Procedure: Real-time Ultrasound Guided injection of the left knee Device: Samsung HS60  Verbal informed consent obtained.  Time-out conducted.  Noted no overlying erythema, induration, or other signs of local infection.  Skin prepped in a sterile fashion.  Local anesthesia: Topical Ethyl chloride.  With sterile technique and under real time ultrasound guidance:  30 mg/2 mL of OrthoVisc (sodium hyaluronate) in a prefilled syringe was injected easily into the knee through a 22-gauge needle. Completed without difficulty  Pain immediately resolved suggesting accurate placement of the medication.  Advised to call if fevers/chills, erythema, induration, drainage, or persistent bleeding.  Images permanently stored and available for review in the ultrasound unit.  Impression: Technically successful ultrasound guided injection.  Procedure: Real-time Ultrasound Guided injection of the right knee Device: Samsung HS60  Verbal informed consent obtained.  Time-out conducted.  Noted no overlying erythema, induration, or other signs of local infection.  Skin prepped in a sterile fashion.  Local anesthesia: Topical Ethyl chloride.  With sterile technique and under real time ultrasound guidance:  30 mg/2 mL of OrthoVisc (sodium hyaluronate) in a prefilled syringe was injected easily into the knee through a 22-gauge needle. Completed without difficulty  Pain immediately resolved suggesting accurate placement of the medication.  Advised to call if fevers/chills, erythema, induration, drainage, or persistent bleeding.  Images permanently stored and available for review in the ultrasound unit.  Impression: Technically successful ultrasound guided injection.

## 2019-04-06 ENCOUNTER — Ambulatory Visit (INDEPENDENT_AMBULATORY_CARE_PROVIDER_SITE_OTHER): Payer: Medicare Other

## 2019-04-06 ENCOUNTER — Ambulatory Visit (INDEPENDENT_AMBULATORY_CARE_PROVIDER_SITE_OTHER): Payer: Medicare Other | Admitting: Sports Medicine

## 2019-04-06 ENCOUNTER — Other Ambulatory Visit: Payer: Self-pay

## 2019-04-06 DIAGNOSIS — M5416 Radiculopathy, lumbar region: Secondary | ICD-10-CM

## 2019-04-06 DIAGNOSIS — M545 Low back pain: Secondary | ICD-10-CM | POA: Diagnosis not present

## 2019-04-06 DIAGNOSIS — M170 Bilateral primary osteoarthritis of knee: Secondary | ICD-10-CM

## 2019-04-06 MED ORDER — GABAPENTIN 300 MG PO CAPS: ORAL_CAPSULE | ORAL | 3 refills | Status: DC

## 2019-04-06 NOTE — Addendum Note (Signed)
Addended by: Silverio Decamp on: 04/06/2019 09:43 AM   Modules accepted: Orders, Level of Service

## 2019-04-06 NOTE — Progress Notes (Addendum)
Subjective:    CC: Follow-up  HPI: Bilateral knee osteoarthritis: Here for Orthovisc No. 2  Back pain: Axial, radiating down the left leg, we treated this with good success with a conservative treatment regimen back in 2016.  Pain is worsening, left-sided.  No bowel or bladder dysfunction, saddle numbness, constitutional symptoms.  I reviewed the past medical history, family history, social history, surgical history, and allergies today and no changes were needed.  Please see the problem list section below in epic for further details.  Past Medical History: No past medical history on file. Past Surgical History: Past Surgical History:  Procedure Laterality Date  . ABDOMINAL HYSTERECTOMY     partial  . BREAST ENHANCEMENT SURGERY    . BREAST EXCISIONAL BIOPSY Right   . CATARACT EXTRACTION Right   . HAND SURGERY  2006, 07  . NECK SURGERY  2002   Social History: Social History   Socioeconomic History  . Marital status: Married    Spouse name: Not on file  . Number of children: Not on file  . Years of education: Not on file  . Highest education level: Not on file  Occupational History  . Not on file  Social Needs  . Financial resource strain: Not on file  . Food insecurity    Worry: Not on file    Inability: Not on file  . Transportation needs    Medical: Not on file    Non-medical: Not on file  Tobacco Use  . Smoking status: Former Research scientist (life sciences)  . Smokeless tobacco: Never Used  Substance and Sexual Activity  . Alcohol use: Yes  . Drug use: No  . Sexual activity: Not on file  Lifestyle  . Physical activity    Days per week: Not on file    Minutes per session: Not on file  . Stress: Not on file  Relationships  . Social Herbalist on phone: Not on file    Gets together: Not on file    Attends religious service: Not on file    Active member of club or organization: Not on file    Attends meetings of clubs or organizations: Not on file    Relationship  status: Not on file  Other Topics Concern  . Not on file  Social History Narrative  . Not on file   Family History: Family History  Problem Relation Age of Onset  . Hypertension Mother   . Arthritis Mother        osteo  . Pulmonary embolism Mother   . Hyperlipidemia Sister    Allergies: Allergies  Allergen Reactions  . Lipitor [Atorvastatin] Other (See Comments)    Myalgias, lightheadedness, headaches, cramping   Medications: See med rec.  Review of Systems: No fevers, chills, night sweats, weight loss, chest pain, or shortness of breath.   Objective:    General: Well Developed, well nourished, and in no acute distress.  Neuro: Alert and oriented x3, extra-ocular muscles intact, sensation grossly intact.  HEENT: Normocephalic, atraumatic, pupils equal round reactive to light, neck supple, no masses, no lymphadenopathy, thyroid nonpalpable.  Skin: Warm and dry, no rashes. Cardiac: Regular rate and rhythm, no murmurs rubs or gallops, no lower extremity edema.  Respiratory: Clear to auscultation bilaterally. Not using accessory muscles, speaking in full sentences.  Procedure: Real-time Ultrasound Guidedinjection of the left knee Device: Samsung HS60  Verbal informed consent obtained.  Time-out conducted.  Noted no overlying erythema, induration, or other signs of local infection.  Skin prepped in a sterile fashion.  Local anesthesia: Topical Ethyl chloride.  With sterile technique and under real time ultrasound guidance: 30 mg/2 mL of OrthoVisc (sodium hyaluronate) in a prefilled syringe was injected easily into the knee through a 22-gauge needle. Completed without difficulty  Pain immediately resolved suggesting accurate placement of the medication.  Advised to call if fevers/chills, erythema, induration, drainage, or persistent bleeding.  Images permanently stored and available for review in the ultrasound unit.  Impression: Technically successful ultrasound guided  injection.  Procedure: Real-time Ultrasound Guidedinjection of the right knee Device: Samsung HS60  Verbal informed consent obtained.  Time-out conducted.  Noted no overlying erythema, induration, or other signs of local infection.  Skin prepped in a sterile fashion.  Local anesthesia: Topical Ethyl chloride.  With sterile technique and under real time ultrasound guidance: 30 mg/2 mL of OrthoVisc (sodium hyaluronate) in a prefilled syringe was injected easily into the knee through a 22-gauge needle. Completed without difficulty  Pain immediately resolved suggesting accurate placement of the medication.  Advised to call if fevers/chills, erythema, induration, drainage, or persistent bleeding.  Images permanently stored and available for review in the ultrasound unit.  Impression: Technically successful ultrasound guided injection.  Impression and Recommendations:    Primary osteoarthritis of both knees Orthovisc No. 2 of 4 into both knees, return in 1 week for a #3 of 4.  Left lumbar radiculopathy History of L1-L3 as well as L5-S1 disc protrusions on an MRI from 2001. Now with recurrence of left lumbar radiculitis, she responded well to physical therapy, prednisone back in 2016. Restarting physical therapy, x-rays, return to see me in 6 weeks, MRI for interventional planning if no better. Also adding gabapentin at bedtime.   ___________________________________________ Gwen Her. Dianah Field, M.D., ABFM., CAQSM. Primary Care and Sports Medicine [facility]  Adjunct Professor of [place] of [facility] of Medicine

## 2019-04-06 NOTE — Assessment & Plan Note (Signed)
History of L1-L3 as well as L5-S1 disc protrusions on an MRI from 2001. Now with recurrence of left lumbar radiculitis, she responded well to physical therapy, prednisone back in 2016. Restarting physical therapy, x-rays, return to see me in 6 weeks, MRI for interventional planning if no better. Also adding gabapentin at bedtime.

## 2019-04-06 NOTE — Assessment & Plan Note (Signed)
Orthovisc No. 2 of 4 into both knees, return in 1 week for a #3 of 4.

## 2019-04-15 ENCOUNTER — Ambulatory Visit (INDEPENDENT_AMBULATORY_CARE_PROVIDER_SITE_OTHER): Payer: Medicare Other | Admitting: Sports Medicine

## 2019-04-15 ENCOUNTER — Other Ambulatory Visit: Payer: Self-pay

## 2019-04-15 DIAGNOSIS — M170 Bilateral primary osteoarthritis of knee: Secondary | ICD-10-CM

## 2019-04-15 NOTE — Assessment & Plan Note (Signed)
Orthovisc No. 3 o f4 to both knees, return in 1 week #4 of 4. Lumbar spine is doing better.

## 2019-04-15 NOTE — Progress Notes (Signed)
   Procedure: Real-time Ultrasound Guidedinjection of theleft knee Device: Samsung HS60  Verbal informed consent obtained.  Time-out conducted.  Noted no overlying erythema, induration, or other signs of local infection.  Skin prepped in a sterile fashion.  Local anesthesia: Topical Ethyl chloride.  With sterile technique and under real time ultrasound guidance:30 mg/2 mL of OrthoVisc (sodium hyaluronate) in a prefilled syringe was injected easily into the knee through a 22-gauge needle. Completed without difficulty  Pain immediately resolved suggesting accurate placement of the medication.  Advised to call if fevers/chills, erythema, induration, drainage, or persistent bleeding.  Images permanently stored and available for review in the ultrasound unit.  Impression: Technically successful ultrasound guided injection.  Procedure: Real-time Ultrasound Guidedinjection of therightknee Device: Samsung HS60  Verbal informed consent obtained.  Time-out conducted.  Noted no overlying erythema, induration, or other signs of local infection.  Skin prepped in a sterile fashion.  Local anesthesia: Topical Ethyl chloride.  With sterile technique and under real time ultrasound guidance:30 mg/2 mL of OrthoVisc (sodium hyaluronate) in a prefilled syringe was injected easily into the knee through a 22-gauge needle. Completed without difficulty  Pain immediately resolved suggesting accurate placement of the medication.  Advised to call if fevers/chills, erythema, induration, drainage, or persistent bleeding.  Images permanently stored and available for review in the ultrasound unit.  Impression: Technically successful ultrasound guided injection.

## 2019-04-22 ENCOUNTER — Ambulatory Visit (INDEPENDENT_AMBULATORY_CARE_PROVIDER_SITE_OTHER): Payer: Medicare Other | Admitting: Sports Medicine

## 2019-04-22 ENCOUNTER — Other Ambulatory Visit: Payer: Self-pay

## 2019-04-22 DIAGNOSIS — M170 Bilateral primary osteoarthritis of knee: Secondary | ICD-10-CM | POA: Diagnosis not present

## 2019-04-22 NOTE — Assessment & Plan Note (Signed)
Orthovisc No. 4 of 4 into both knees, return as needed.

## 2019-04-22 NOTE — Progress Notes (Signed)
   Procedure: Real-time Ultrasound Guidedinjection of theleft knee Device: Samsung HS60  Verbal informed consent obtained.  Time-out conducted.  Noted no overlying erythema, induration, or other signs of local infection.  Skin prepped in a sterile fashion.  Local anesthesia: Topical Ethyl chloride.  With sterile technique and under real time ultrasound guidance:30 mg/2 mL of OrthoVisc (sodium hyaluronate) in a prefilled syringe was injected easily into the knee through a 22-gauge needle. Completed without difficulty  Pain immediately resolved suggesting accurate placement of the medication.  Advised to call if fevers/chills, erythema, induration, drainage, or persistent bleeding.  Images permanently stored and available for review in the ultrasound unit.  Impression: Technically successful ultrasound guided injection.  Procedure: Real-time Ultrasound Guidedinjection of therightknee Device: Samsung HS60  Verbal informed consent obtained.  Time-out conducted.  Noted no overlying erythema, induration, or other signs of local infection.  Skin prepped in a sterile fashion.  Local anesthesia: Topical Ethyl chloride.  With sterile technique and under real time ultrasound guidance:30 mg/2 mL of OrthoVisc (sodium hyaluronate) in a prefilled syringe was injected easily into the knee through a 22-gauge needle. Completed without difficulty  Pain immediately resolved suggesting accurate placement of the medication.  Advised to call if fevers/chills, erythema, induration, drainage, or persistent bleeding.  Images permanently stored and available for review in the ultrasound unit.  Impression: Technically successful ultrasound guided injection.

## 2019-04-28 ENCOUNTER — Telehealth: Payer: Self-pay | Admitting: *Deleted

## 2019-04-28 MED ORDER — GABAPENTIN 100 MG PO CAPS: ORAL_CAPSULE | ORAL | 11 refills | Status: DC

## 2019-04-28 NOTE — Telephone Encounter (Signed)
Pt left a vm this afternoon wanting you to change her Gabapentin to 100mg  instead of the 300mg  and send in a new rx.

## 2019-04-28 NOTE — Telephone Encounter (Signed)
Done

## 2019-04-29 NOTE — Telephone Encounter (Signed)
Pt notified of new rx.

## 2019-05-03 ENCOUNTER — Other Ambulatory Visit: Payer: Self-pay | Admitting: Sports Medicine

## 2019-05-03 DIAGNOSIS — M5416 Radiculopathy, lumbar region: Secondary | ICD-10-CM

## 2019-05-12 ENCOUNTER — Encounter: Payer: Self-pay | Admitting: Family Medicine

## 2019-05-12 ENCOUNTER — Ambulatory Visit (INDEPENDENT_AMBULATORY_CARE_PROVIDER_SITE_OTHER): Payer: Medicare Other | Admitting: Family Medicine

## 2019-05-12 VITALS — Temp 98.6°F

## 2019-05-12 DIAGNOSIS — Z20828 Contact with and (suspected) exposure to other viral communicable diseases: Secondary | ICD-10-CM

## 2019-05-12 DIAGNOSIS — Z20822 Contact with and (suspected) exposure to covid-19: Secondary | ICD-10-CM

## 2019-05-12 DIAGNOSIS — Z87891 Personal history of nicotine dependence: Secondary | ICD-10-CM

## 2019-05-12 NOTE — Progress Notes (Signed)
Virtual Visit via telephone note  I connected with Victoria Benjamin on 05/12/19 at 11:30 AM EST by a video enabled telemedicine application and verified that I am speaking with the correct person using two identifiers.   I discussed the limitations of evaluation and management by telemedicine and the availability of in person appointments. The patient expressed understanding and agreed to proceed.  Subjective:    CC: Known COVID exposure.    HPI:  74 yo female who was around her grandson on christmas day.  He had cold sxs that day.  She says she really did not come in close contact with him in fact most of the day she was in the kitchen and he was in the living room.  She did not actually physically touch him.  He went for COVID testing and came back positive same day.  He had a rapid test performed.Marland Kitchen  He is feeling better now.  She hasn't had any symptoms such as sore throat, headache, fever etc.  It has now been 7 days since she was around him.     Past medical history, Surgical history, Family history not pertinant except as noted below, Social history, Allergies, and medications have been entered into the medical record, reviewed, and corrections made.   Review of Systems: No fevers, chills, night sweats, weight loss, chest pain, or shortness of breath.   Objective:    General: Speaking clearly in complete sentences without any shortness of breath.  Alert and oriented x3.  Normal judgment. No apparent acute distress.    Impression and Recommendations:   Possible Covid exposure-is been 7 days since she was around her grandson who tested positive about 2 to 3 days after Christmas.  He had mild upper respiratory symptoms and she has been completely asymptomatic.  We discussed options including getting tested.  If she gets tested then she will need to quarantine as we do not have a rapid available so she would need to quarantine until those results come back.  We also discussed the option  of just self quarantining through the weekend.  And if she is completely asymptomatic by Monday which would be day 11 after potential exposure then she is very unlikely to have Covid and would limit potential exposure to anyone else.  She says that she will plan to just self quarantine on her own through the weekend.  Reviewed symptoms of Covid and if at any point she develops any of those to please give [place] call back.  Time spent 16 minutes, greater than half of the time spent counseling about potential exposure to Covid.    I discussed the assessment and treatment plan with the patient. The patient was provided an opportunity to ask questions and all were answered. The patient agreed with the plan and demonstrated an understanding of the instructions.   The patient was advised to call back or seek an in-person evaluation if the symptoms worsen or if the condition fails to improve as anticipated.   [name], MD

## 2019-05-12 NOTE — Progress Notes (Signed)
Pt reports that she was exposed on 05/06/2019. She stated she was in contact with her grandson.   She denies f/s/c/n/v/d/body aches,headache,no loss of taste or smell.

## 2019-05-12 NOTE — Addendum Note (Signed)
Addended by: Towana Badger on: 05/12/2019 02:45 PM   Modules accepted: Orders

## 2019-05-14 LAB — NOVEL CORONAVIRUS, NAA: SARS-CoV-2, NAA: NOT DETECTED

## 2019-07-11 DIAGNOSIS — H43811 Vitreous degeneration, right eye: Secondary | ICD-10-CM | POA: Diagnosis not present

## 2019-07-11 DIAGNOSIS — H35373 Puckering of macula, bilateral: Secondary | ICD-10-CM | POA: Diagnosis not present

## 2019-07-11 DIAGNOSIS — H26491 Other secondary cataract, right eye: Secondary | ICD-10-CM | POA: Diagnosis not present

## 2019-07-11 DIAGNOSIS — H52201 Unspecified astigmatism, right eye: Secondary | ICD-10-CM | POA: Diagnosis not present

## 2019-07-11 DIAGNOSIS — Z961 Presence of intraocular lens: Secondary | ICD-10-CM | POA: Diagnosis not present

## 2019-07-25 ENCOUNTER — Encounter: Payer: Self-pay | Admitting: Family Medicine

## 2019-07-25 ENCOUNTER — Other Ambulatory Visit: Payer: Self-pay | Admitting: Family Medicine

## 2019-07-25 ENCOUNTER — Ambulatory Visit (INDEPENDENT_AMBULATORY_CARE_PROVIDER_SITE_OTHER): Payer: Medicare Other | Admitting: Family Medicine

## 2019-07-25 ENCOUNTER — Other Ambulatory Visit: Payer: Self-pay

## 2019-07-25 VITALS — BP 131/63 | HR 63 | Ht 63.0 in | Wt 141.0 lb

## 2019-07-25 DIAGNOSIS — K2100 Gastro-esophageal reflux disease with esophagitis, without bleeding: Secondary | ICD-10-CM | POA: Diagnosis not present

## 2019-07-25 DIAGNOSIS — R7989 Other specified abnormal findings of blood chemistry: Secondary | ICD-10-CM | POA: Diagnosis not present

## 2019-07-25 DIAGNOSIS — E785 Hyperlipidemia, unspecified: Secondary | ICD-10-CM

## 2019-07-25 DIAGNOSIS — R7301 Impaired fasting glucose: Secondary | ICD-10-CM

## 2019-07-25 DIAGNOSIS — M8580 Other specified disorders of bone density and structure, unspecified site: Secondary | ICD-10-CM | POA: Diagnosis not present

## 2019-07-25 DIAGNOSIS — Z780 Asymptomatic menopausal state: Secondary | ICD-10-CM

## 2019-07-25 DIAGNOSIS — Z23 Encounter for immunization: Secondary | ICD-10-CM | POA: Diagnosis not present

## 2019-07-25 LAB — POCT GLYCOSYLATED HEMOGLOBIN (HGB A1C): Hemoglobin A1C: 5.7 % — AB (ref 4.0–5.6)

## 2019-07-25 MED ORDER — TETANUS-DIPHTH-ACELL PERTUSSIS 5-2.5-18.5 LF-MCG/0.5 IM SUSP: 0.5000 mL | Freq: Once | INTRAMUSCULAR | 0 refills | Status: AC

## 2019-07-25 NOTE — Assessment & Plan Note (Addendum)
Last lipids done in September. LDL less than 100.

## 2019-07-25 NOTE — Assessment & Plan Note (Signed)
Well controlled. Continue current regimen. Follow up in  6 months.  

## 2019-07-25 NOTE — Progress Notes (Signed)
Established Patient Office Visit  Subjective:  Patient ID: Victoria Benjamin, female    DOB: December 07, 1945  Age: 74 y.o. MRN: [id number]  CC:  Chief Complaint  Patient presents with  . ifg    HPI [name] presents for   Impaired fasting glucose-no increased thirst or urination. No symptoms consistent with hypoglycemia.  F/U GERD - taking PPI daily.  No excess sxs.   Hyperlipidemia - tolerating stating well with no myalgias or significant side effects.  Lab Results  Component Value Date   CHOL 168 01/24/2019   HDL 46 (L) 01/24/2019   LDLCALC 93 01/24/2019   TRIG 202 (H) 01/24/2019   CHOLHDL 3.7 01/24/2019       History reviewed. No pertinent past medical history.  Past Surgical History:  Procedure Laterality Date  . ABDOMINAL HYSTERECTOMY     partial  . BREAST ENHANCEMENT SURGERY    . BREAST EXCISIONAL BIOPSY Right   . CATARACT EXTRACTION Right   . HAND SURGERY  2006, 07  . NECK SURGERY  2002    Family History  Problem Relation Age of Onset  . Hypertension Mother   . Arthritis Mother        osteo  . Pulmonary embolism Mother   . Hyperlipidemia Sister     Social History   Socioeconomic History  . Marital status: Married    Spouse name: Not on file  . Number of children: Not on file  . Years of education: Not on file  . Highest education level: Not on file  Occupational History  . Not on file  Tobacco Use  . Smoking status: Former Research scientist (life sciences)  . Smokeless tobacco: Never Used  Substance and Sexual Activity  . Alcohol use: Yes  . Drug use: No  . Sexual activity: Not on file  Other Topics Concern  . Not on file  Social History Narrative  . Not on file   Social Determinants of Health   Financial Resource Strain:   . Difficulty of Paying Living Expenses:   Food Insecurity:   . Worried About Charity fundraiser in the Last Year:   . Arboriculturist in the Last Year:   Transportation Needs:   . Film/video editor (Medical):   Marland Kitchen Lack of  Transportation (Non-Medical):   Physical Activity:   . Days of Exercise per Week:   . Minutes of Exercise per Session:   Stress:   . Feeling of Stress :   Social Connections:   . Frequency of Communication with Friends and Family:   . Frequency of Social Gatherings with Friends and Family:   . Attends Religious Services:   . Active Member of Clubs or Organizations:   . Attends Archivist Meetings:   Marland Kitchen Marital Status:   Intimate Partner Violence:   . Fear of Current or Ex-Partner:   . Emotionally Abused:   Marland Kitchen Physically Abused:   . Sexually Abused:     Outpatient Medications Prior to Visit  Medication Sig Dispense Refill  . calcium carbonate (OSCAL) 1500 (600 Ca) MG TABS tablet Take 600 mg of elemental calcium by mouth daily with breakfast.    . gabapentin (NEURONTIN) 100 MG capsule One tab PO qHS for a week, then BID for a week, then TID. 90 capsule 11  . ipratropium (ATROVENT) 0.03 % nasal spray Place 2 sprays into both nostrils every 12 (twelve) hours. 30 mL 2  . omeprazole (PRILOSEC) 20 MG capsule TAKE 1 CAPSULE(20  MG) BY MOUTH DAILY 90 capsule 3  . rosuvastatin (CRESTOR) 10 MG tablet TAKE 1 TABLET(10 MG) BY MOUTH DAILY 90 tablet 3   No facility-administered medications prior to visit.    Allergies  Allergen Reactions  . Lipitor [Atorvastatin] Other (See Comments)    Myalgias, lightheadedness, headaches, cramping    ROS Review of Systems    Objective:    Physical Exam  Constitutional: She is oriented to person, place, and time. She appears well-developed and well-nourished.  HENT:  Head: Normocephalic and atraumatic.  Cardiovascular: Normal rate, regular rhythm and normal heart sounds.  Pulmonary/Chest: Effort normal and breath sounds normal.  Neurological: She is alert and oriented to person, place, and time.  Skin: Skin is warm and dry.  Psychiatric: She has a normal mood and affect. Her behavior is normal.    BP 131/63   Pulse 63   Ht 5\' 3"  (1.6  m)   Wt 141 lb (64 kg)   SpO2 98%   BMI 24.98 kg/m  Wt Readings from Last 3 Encounters:  07/25/19 141 lb (64 kg)  03/17/19 140 lb (63.5 kg)  01/24/19 142 lb (64.4 kg)     There are no preventive care reminders to display for this patient.  There are no preventive care reminders to display for this patient.  Lab Results  Component Value Date   TSH 1.505 10/25/2008   Lab Results  Component Value Date   WBC 5.3 01/24/2019   HGB 14.1 01/24/2019   HCT 41.7 01/24/2019   MCV 93.7 01/24/2019   PLT 209 01/24/2019   Lab Results  Component Value Date   NA 143 01/24/2019   K 4.5 01/24/2019   CO2 31 01/24/2019   GLUCOSE 96 01/24/2019   BUN 14 01/24/2019   CREATININE 0.97 (H) 01/24/2019   BILITOT 0.8 01/24/2019   ALKPHOS 99 11/19/2015   AST 25 01/24/2019   ALT 21 01/24/2019   PROT 6.9 01/24/2019   ALBUMIN 4.4 11/19/2015   CALCIUM 10.3 01/24/2019   Lab Results  Component Value Date   CHOL 168 01/24/2019   Lab Results  Component Value Date   HDL 46 (L) 01/24/2019   Lab Results  Component Value Date   LDLCALC 93 01/24/2019   Lab Results  Component Value Date   TRIG 202 (H) 01/24/2019   Lab Results  Component Value Date   CHOLHDL 3.7 01/24/2019   Lab Results  Component Value Date   HGBA1C 5.7 (A) 07/25/2019      Assessment & Plan:   Problem List Items Addressed This Visit      Digestive   Gastroesophageal reflux disease with esophagitis without hemorrhage    ON daily PPI therapy. Will monitor.  NEeds UP to date DEXA         Endocrine   IFG (impaired fasting glucose) - Primary    Well controlled. Continue current regimen. Follow up in  6 months.       Relevant Orders   POCT glycosylated hemoglobin (Hb A1C) (Completed)     Musculoskeletal and Integument   Osteopenia    Due for repeat DEXA its been 5 years.      Relevant Orders   DG Bone Density     Other   Hyperlipidemia    Last lipids done in September. LDL less than 100.         Elevated serum creatinine   Relevant Orders   BASIC METABOLIC PANEL WITH GFR   Urine Microalbumin w/creat. ratio  Other Visit Diagnoses    Need for tetanus, diphtheria, and acellular pertussis (Tdap) vaccine in patient of adolescent age or older       Relevant Medications   Tdap ([place]) 5-2.5-18.5 LF-MCG/0.5 injection      Meds ordered this encounter  Medications  . Tdap (BOOSTRIX) 5-2.5-18.5 LF-MCG/0.5 injection    Sig: Inject 0.5 mLs into the muscle once for 1 dose. DX: Z23    Dispense:  0.5 mL    Refill:  0    Follow-up: Return in about 6 months (around 01/25/2020).    [name], MD

## 2019-07-25 NOTE — Assessment & Plan Note (Signed)
Due for repeat DEXA its been 5 years.

## 2019-07-25 NOTE — Assessment & Plan Note (Signed)
ON daily PPI therapy. Will monitor.  NEeds UP to date DEXA

## 2019-07-26 LAB — BASIC METABOLIC PANEL WITH GFR
BUN: 13 mg/dL (ref 7–25)
CO2: 31 mmol/L (ref 20–32)
Calcium: 10.3 mg/dL (ref 8.6–10.4)
Chloride: 106 mmol/L (ref 98–110)
Creat: 0.81 mg/dL (ref 0.60–0.93)
GFR, Est African American: 84 mL/min/{1.73_m2} (ref >=60)
GFR, Est Non African American: 72 mL/min/{1.73_m2} (ref >=60)
Glucose, Bld: 101 mg/dL — ABNORMAL HIGH (ref 65–99)
Potassium: 5.3 mmol/L (ref 3.5–5.3)
Sodium: 144 mmol/L (ref 135–146)

## 2019-07-26 LAB — MICROALBUMIN / CREATININE URINE RATIO
Creatinine, Urine: 96 mg/dL (ref 20–275)
Microalb Creat Ratio: 6 mcg/mg creat (ref <30)
Microalb, Ur: 0.6 mg/dL

## 2019-07-26 NOTE — Progress Notes (Signed)
All labs are normal. 

## 2019-07-27 ENCOUNTER — Ambulatory Visit (INDEPENDENT_AMBULATORY_CARE_PROVIDER_SITE_OTHER): Payer: Medicare Other

## 2019-07-27 ENCOUNTER — Other Ambulatory Visit: Payer: Self-pay

## 2019-07-27 DIAGNOSIS — Z780 Asymptomatic menopausal state: Secondary | ICD-10-CM | POA: Diagnosis not present

## 2019-07-27 DIAGNOSIS — M8589 Other specified disorders of bone density and structure, multiple sites: Secondary | ICD-10-CM | POA: Diagnosis not present

## 2019-08-01 ENCOUNTER — Encounter: Payer: Self-pay | Admitting: Family Medicine

## 2019-08-01 DIAGNOSIS — H527 Unspecified disorder of refraction: Secondary | ICD-10-CM | POA: Diagnosis not present

## 2019-08-01 DIAGNOSIS — Z961 Presence of intraocular lens: Secondary | ICD-10-CM | POA: Diagnosis not present

## 2019-08-01 DIAGNOSIS — H26491 Other secondary cataract, right eye: Secondary | ICD-10-CM | POA: Diagnosis not present

## 2019-08-01 HISTORY — PX: CAPSULOTOMY: SHX379

## 2019-08-03 ENCOUNTER — Encounter: Payer: Self-pay | Admitting: *Deleted

## 2019-08-04 ENCOUNTER — Telehealth: Payer: Self-pay

## 2019-08-04 DIAGNOSIS — M170 Bilateral primary osteoarthritis of knee: Secondary | ICD-10-CM

## 2019-08-04 NOTE — Telephone Encounter (Signed)
Pt is requesting referral for knee surgery.

## 2020-01-03 ENCOUNTER — Other Ambulatory Visit: Payer: Self-pay | Admitting: Family Medicine

## 2020-01-25 ENCOUNTER — Ambulatory Visit: Payer: Medicare Other | Admitting: Family Medicine

## 2020-01-26 ENCOUNTER — Other Ambulatory Visit: Payer: Self-pay | Admitting: Family Medicine

## 2020-01-26 ENCOUNTER — Ambulatory Visit: Payer: Medicare Other | Admitting: Family Medicine

## 2020-01-26 DIAGNOSIS — Z1231 Encounter for screening mammogram for malignant neoplasm of breast: Secondary | ICD-10-CM

## 2020-02-01 ENCOUNTER — Ambulatory Visit: Payer: Medicare Other

## 2020-02-07 ENCOUNTER — Encounter: Payer: Self-pay | Admitting: Family Medicine

## 2020-02-07 ENCOUNTER — Ambulatory Visit (INDEPENDENT_AMBULATORY_CARE_PROVIDER_SITE_OTHER): Payer: Medicare Other | Admitting: Family Medicine

## 2020-02-07 ENCOUNTER — Other Ambulatory Visit: Payer: Self-pay

## 2020-02-07 VITALS — BP 129/61 | HR 70 | Ht 63.0 in | Wt 144.0 lb

## 2020-02-07 DIAGNOSIS — R7301 Impaired fasting glucose: Secondary | ICD-10-CM

## 2020-02-07 DIAGNOSIS — K2100 Gastro-esophageal reflux disease with esophagitis, without bleeding: Secondary | ICD-10-CM

## 2020-02-07 DIAGNOSIS — E785 Hyperlipidemia, unspecified: Secondary | ICD-10-CM | POA: Diagnosis not present

## 2020-02-07 DIAGNOSIS — Z23 Encounter for immunization: Secondary | ICD-10-CM

## 2020-02-07 DIAGNOSIS — Z8659 Personal history of other mental and behavioral disorders: Secondary | ICD-10-CM

## 2020-02-07 LAB — POCT GLYCOSYLATED HEMOGLOBIN (HGB A1C): Hemoglobin A1C: 5.6 % (ref 4.0–5.6)

## 2020-02-07 MED ORDER — TETANUS-DIPHTH-ACELL PERTUSSIS 5-2.5-18.5 LF-MCG/0.5 IM SUSP: 0.5000 mL | Freq: Once | INTRAMUSCULAR | 0 refills | Status: AC

## 2020-02-07 NOTE — Assessment & Plan Note (Signed)
To recheck lipids and liver enzymes.  Continue current regimen.

## 2020-02-07 NOTE — Assessment & Plan Note (Signed)
Well controlled. Continue current regimen. Follow up in  6 mo  

## 2020-02-07 NOTE — Assessment & Plan Note (Signed)
Doing well. Neg dep screen.

## 2020-02-07 NOTE — Assessment & Plan Note (Signed)
Encouraged her to speak with the pharmacist to see if they can get the previous generic if it is available.  If not we can always try switching to a slightly different PPI.

## 2020-02-07 NOTE — Progress Notes (Signed)
Established Patient Office Visit  Subjective:  Patient ID: Victoria Benjamin, female    DOB: 28-Dec-1945  Age: 74 y.o. MRN: [id number]  CC:  Chief Complaint  Patient presents with  . ifg    HPI BRESHAY ILG presents for   Impaired fasting glucose-no increased thirst or urination. No symptoms consistent with hypoglycemia.  Done a great job and cutting back on breads and carbs.  Follow-up reflux-she says recently she was given a slightly different generic of omeprazole and says it does not seem to be working as well.  Hyperlipidemia-tolerating statin well without any side effects or problems.  No past medical history on file.  Past Surgical History:  Procedure Laterality Date  . ABDOMINAL HYSTERECTOMY     partial  . BREAST ENHANCEMENT SURGERY    . BREAST EXCISIONAL BIOPSY Right   . CAPSULOTOMY Right 08/01/2019  . CATARACT EXTRACTION Right   . HAND SURGERY  2006, 07  . NECK SURGERY  2002    Family History  Problem Relation Age of Onset  . Hypertension Mother   . Arthritis Mother        osteo  . Pulmonary embolism Mother   . Hyperlipidemia Sister     Social History   Socioeconomic History  . Marital status: Married    Spouse name: Not on file  . Number of children: Not on file  . Years of education: Not on file  . Highest education level: Not on file  Occupational History  . Not on file  Tobacco Use  . Smoking status: Former Research scientist (life sciences)  . Smokeless tobacco: Never Used  Substance and Sexual Activity  . Alcohol use: Yes  . Drug use: No  . Sexual activity: Not on file  Other Topics Concern  . Not on file  Social History Narrative  . Not on file   Social Determinants of Health   Financial Resource Strain:   . Difficulty of Paying Living Expenses: Not on file  Food Insecurity:   . Worried About Charity fundraiser in the Last Year: Not on file  . Ran Out of Food in the Last Year: Not on file  Transportation Needs:   . Lack of Transportation  (Medical): Not on file  . Lack of Transportation (Non-Medical): Not on file  Physical Activity:   . Days of Exercise per Week: Not on file  . Minutes of Exercise per Session: Not on file  Stress:   . Feeling of Stress : Not on file  Social Connections:   . Frequency of Communication with Friends and Family: Not on file  . Frequency of Social Gatherings with Friends and Family: Not on file  . Attends Religious Services: Not on file  . Active Member of Clubs or Organizations: Not on file  . Attends Archivist Meetings: Not on file  . Marital Status: Not on file  Intimate Partner Violence:   . Fear of Current or Ex-Partner: Not on file  . Emotionally Abused: Not on file  . Physically Abused: Not on file  . Sexually Abused: Not on file    Outpatient Medications Prior to Visit  Medication Sig Dispense Refill  . calcium carbonate (OSCAL) 1500 (600 Ca) MG TABS tablet Take 600 mg of elemental calcium by mouth daily with breakfast.    . omeprazole (PRILOSEC) 20 MG capsule TAKE 1 CAPSULE(20 MG) BY MOUTH DAILY 90 capsule 1  . rosuvastatin (CRESTOR) 10 MG tablet Take 1 tablet (10 mg total) by mouth  daily. Labs for refills 90 tablet 0  . gabapentin (NEURONTIN) 100 MG capsule One tab PO qHS for a week, then BID for a week, then TID. 90 capsule 11  . ipratropium (ATROVENT) 0.03 % nasal spray Place 2 sprays into both nostrils every 12 (twelve) hours. 30 mL 2   No facility-administered medications prior to visit.    Allergies  Allergen Reactions  . Lipitor [Atorvastatin] Other (See Comments)    Myalgias, lightheadedness, headaches, cramping    ROS Review of Systems    Objective:    Physical Exam Constitutional:      Appearance: She is well-developed.  HENT:     Head: Normocephalic and atraumatic.  Cardiovascular:     Rate and Rhythm: Normal rate and regular rhythm.     Heart sounds: Normal heart sounds.  Pulmonary:     Effort: Pulmonary effort is normal.     Breath  sounds: Normal breath sounds.  Skin:    General: Skin is warm and dry.  Neurological:     Mental Status: She is alert and oriented to person, place, and time.  Psychiatric:        Behavior: Behavior normal.     BP 129/61   Pulse 70   Ht 5\' 3"  (1.6 m)   Wt 144 lb (65.3 kg)   SpO2 96%   BMI 25.51 kg/m  Wt Readings from Last 3 Encounters:  02/07/20 144 lb (65.3 kg)  07/25/19 141 lb (64 kg)  03/17/19 140 lb (63.5 kg)     Health Maintenance Due  Topic Date Due  . TETANUS/TDAP  08/31/2018    There are no preventive care reminders to display for this patient.  Lab Results  Component Value Date   TSH 1.505 10/25/2008   Lab Results  Component Value Date   WBC 5.3 01/24/2019   HGB 14.1 01/24/2019   HCT 41.7 01/24/2019   MCV 93.7 01/24/2019   PLT 209 01/24/2019   Lab Results  Component Value Date   NA 144 07/25/2019   K 5.3 07/25/2019   CO2 31 07/25/2019   GLUCOSE 101 (H) 07/25/2019   BUN 13 07/25/2019   CREATININE 0.81 07/25/2019   BILITOT 0.8 01/24/2019   ALKPHOS 99 11/19/2015   AST 25 01/24/2019   ALT 21 01/24/2019   PROT 6.9 01/24/2019   ALBUMIN 4.4 11/19/2015   CALCIUM 10.3 07/25/2019   Lab Results  Component Value Date   CHOL 168 01/24/2019   Lab Results  Component Value Date   HDL 46 (L) 01/24/2019   Lab Results  Component Value Date   LDLCALC 93 01/24/2019   Lab Results  Component Value Date   TRIG 202 (H) 01/24/2019   Lab Results  Component Value Date   CHOLHDL 3.7 01/24/2019   Lab Results  Component Value Date   HGBA1C 5.6 02/07/2020      Assessment & Plan:   Problem List Items Addressed This Visit      Digestive   Gastroesophageal reflux disease with esophagitis without hemorrhage    Encouraged her to speak with the pharmacist to see if they can get the previous generic if it is available.  If not we can always try switching to a slightly different PPI.        Endocrine   IFG (impaired fasting glucose) - Primary    Well  controlled. Continue current regimen. Follow up in  6 mo      Relevant Orders   POCT glycosylated hemoglobin (Hb  A1C) (Completed)   COMPLETE METABOLIC PANEL WITH GFR   Lipid panel     Other   Hyperlipidemia    To recheck lipids and liver enzymes.  Continue current regimen.      Relevant Orders   COMPLETE METABOLIC PANEL WITH GFR   Lipid panel   History of depression    Doing well. Neg dep screen.        Other Visit Diagnoses    Need for immunization against influenza       Relevant Orders   Flu Vaccine QUAD High Dose(Fluad) (Completed)   Need for tetanus, diphtheria, and acellular pertussis (Tdap) vaccine in patient of adolescent age or older       Relevant Medications   Tdap (BOOSTRIX) 5-2.5-18.5 LF-MCG/0.5 injection      Meds ordered this encounter  Medications  . Tdap (BOOSTRIX) 5-2.5-18.5 LF-MCG/0.5 injection    Sig: Inject 0.5 mLs into the muscle once for 1 dose.    Dispense:  0.5 mL    Refill:  0    Follow-up: Return in about 6 months (around 08/06/2020) for IFG.    [name], MD

## 2020-02-08 LAB — COMPLETE METABOLIC PANEL WITH GFR
AG Ratio: 1.8 (calc) (ref 1.0–2.5)
ALT: 18 U/L (ref 6–29)
AST: 23 U/L (ref 10–35)
Albumin: 4.4 g/dL (ref 3.6–5.1)
Alkaline phosphatase (APISO): 70 U/L (ref 37–153)
BUN/Creatinine Ratio: 12 (calc) (ref 6–22)
BUN: 11 mg/dL (ref 7–25)
CO2: 25 mmol/L (ref 20–32)
Calcium: 10.1 mg/dL (ref 8.6–10.4)
Chloride: 105 mmol/L (ref 98–110)
Creat: 0.94 mg/dL — ABNORMAL HIGH (ref 0.60–0.93)
GFR, Est African American: 70 mL/min/{1.73_m2} (ref >=60)
GFR, Est Non African American: 60 mL/min/{1.73_m2} (ref >=60)
Globulin: 2.4 g/dL (calc) (ref 1.9–3.7)
Glucose, Bld: 94 mg/dL (ref 65–99)
Potassium: 4.2 mmol/L (ref 3.5–5.3)
Sodium: 140 mmol/L (ref 135–146)
Total Bilirubin: 0.7 mg/dL (ref 0.2–1.2)
Total Protein: 6.8 g/dL (ref 6.1–8.1)

## 2020-02-08 LAB — LIPID PANEL
Cholesterol: 186 mg/dL (ref <200)
HDL: 50 mg/dL (ref >=50)
LDL Cholesterol (Calc): 109 mg/dL (calc) — ABNORMAL HIGH
Non-HDL Cholesterol (Calc): 136 mg/dL (calc) — ABNORMAL HIGH (ref <130)
Total CHOL/HDL Ratio: 3.7 (calc) (ref <5.0)
Triglycerides: 155 mg/dL — ABNORMAL HIGH (ref <150)

## 2020-02-09 ENCOUNTER — Ambulatory Visit (INDEPENDENT_AMBULATORY_CARE_PROVIDER_SITE_OTHER): Payer: Medicare Other

## 2020-02-09 ENCOUNTER — Other Ambulatory Visit: Payer: Self-pay

## 2020-02-09 DIAGNOSIS — Z1231 Encounter for screening mammogram for malignant neoplasm of breast: Secondary | ICD-10-CM

## 2020-02-13 ENCOUNTER — Other Ambulatory Visit: Payer: Self-pay | Admitting: Family Medicine

## 2020-02-13 DIAGNOSIS — R928 Other abnormal and inconclusive findings on diagnostic imaging of breast: Secondary | ICD-10-CM

## 2020-02-16 DIAGNOSIS — C44622 Squamous cell carcinoma of skin of right upper limb, including shoulder: Secondary | ICD-10-CM | POA: Diagnosis not present

## 2020-02-16 DIAGNOSIS — L570 Actinic keratosis: Secondary | ICD-10-CM | POA: Diagnosis not present

## 2020-02-20 ENCOUNTER — Other Ambulatory Visit: Payer: Self-pay

## 2020-02-20 ENCOUNTER — Ambulatory Visit
Admission: RE | Admit: 2020-02-20 | Discharge: 2020-02-20 | Disposition: A | Discharge status: Home / Self Care | Payer: Medicare Other | Source: Ambulatory Visit | Attending: Family Medicine | Admitting: Family Medicine

## 2020-02-20 ENCOUNTER — Other Ambulatory Visit: Payer: Self-pay | Admitting: Family Medicine

## 2020-02-20 DIAGNOSIS — N6489 Other specified disorders of breast: Secondary | ICD-10-CM | POA: Diagnosis not present

## 2020-02-20 DIAGNOSIS — R922 Inconclusive mammogram: Secondary | ICD-10-CM | POA: Diagnosis not present

## 2020-02-20 DIAGNOSIS — R928 Other abnormal and inconclusive findings on diagnostic imaging of breast: Secondary | ICD-10-CM

## 2020-02-29 DIAGNOSIS — Z23 Encounter for immunization: Secondary | ICD-10-CM | POA: Diagnosis not present

## 2020-04-02 ENCOUNTER — Other Ambulatory Visit: Payer: Self-pay | Admitting: Family Medicine

## 2020-06-21 DIAGNOSIS — L821 Other seborrheic keratosis: Secondary | ICD-10-CM | POA: Diagnosis not present

## 2020-06-21 DIAGNOSIS — L570 Actinic keratosis: Secondary | ICD-10-CM | POA: Diagnosis not present

## 2020-06-21 DIAGNOSIS — Z85828 Personal history of other malignant neoplasm of skin: Secondary | ICD-10-CM | POA: Diagnosis not present

## 2020-07-01 ENCOUNTER — Other Ambulatory Visit: Payer: Self-pay | Admitting: Family Medicine

## 2020-07-12 DIAGNOSIS — H43812 Vitreous degeneration, left eye: Secondary | ICD-10-CM | POA: Diagnosis not present

## 2020-08-06 ENCOUNTER — Ambulatory Visit: Payer: Medicare Other | Admitting: Family Medicine

## 2020-08-16 ENCOUNTER — Other Ambulatory Visit: Payer: Self-pay

## 2020-08-16 ENCOUNTER — Ambulatory Visit (INDEPENDENT_AMBULATORY_CARE_PROVIDER_SITE_OTHER): Payer: Medicare Other | Admitting: Family Medicine

## 2020-08-16 ENCOUNTER — Encounter: Payer: Self-pay | Admitting: Family Medicine

## 2020-08-16 VITALS — BP 133/57 | HR 67 | Ht 63.0 in | Wt 145.0 lb

## 2020-08-16 DIAGNOSIS — M8580 Other specified disorders of bone density and structure, unspecified site: Secondary | ICD-10-CM | POA: Diagnosis not present

## 2020-08-16 DIAGNOSIS — M2550 Pain in unspecified joint: Secondary | ICD-10-CM | POA: Diagnosis not present

## 2020-08-16 DIAGNOSIS — R7301 Impaired fasting glucose: Secondary | ICD-10-CM | POA: Diagnosis not present

## 2020-08-16 DIAGNOSIS — R002 Palpitations: Secondary | ICD-10-CM

## 2020-08-16 DIAGNOSIS — M26622 Arthralgia of left temporomandibular joint: Secondary | ICD-10-CM | POA: Diagnosis not present

## 2020-08-16 LAB — POCT GLYCOSYLATED HEMOGLOBIN (HGB A1C): Hemoglobin A1C: 5.8 % — AB (ref 4.0–5.6)

## 2020-08-16 MED ORDER — TIZANIDINE HCL 4 MG PO TABS: 2.0000 mg | ORAL_TABLET | Freq: Every evening | ORAL | 0 refills | Status: DC | PRN

## 2020-08-16 NOTE — Assessment & Plan Note (Signed)
Well controlled. Continue current regimen. Follow up in  6 months.  

## 2020-08-16 NOTE — Assessment & Plan Note (Signed)
Complains of joint pain that can move around it can sometimes be in her knees shoulders etc.  We discussed just making sure that she is staying active and getting into a good stretching routine daily to help with her arthritis.

## 2020-08-16 NOTE — Assessment & Plan Note (Signed)
Encouraged her to take a calcium with vitamin D daily she says she already takes 1 which is great.

## 2020-08-16 NOTE — Assessment & Plan Note (Signed)
Discussed trial of muscle relaxer at night to see if helps. We discussed mouth guard as well . She doesn't currently use one.

## 2020-08-16 NOTE — Progress Notes (Signed)
Established Patient Office Visit  Subjective:  Patient ID: Victoria Benjamin, female    DOB: June 09, 1945  Age: 75 y.o. MRN: [id number]  CC:  Chief Complaint  Patient presents with  . ifg  . Hypertension    HPI Victoria Benjamin presents for   Impaired fasting glucose-no increased thirst or urination. No symptoms consistent with hypoglycemia. She has been avoiding noodles.    Couple of months ago woke up with palpitation but hasn't happened since then.  Not sure how long it lasted but went back to sleep.  She has not had any recent chest pain she does remember feeling particularly stressed out that day.  She says she always worries about family etc.  But it was not unusual she did not have more caffeine than usual she usually has 1 cup of coffee in the morning and that is it.  No other caffeinated beverages.  Given she has not had it happen since then.  He does report that she is noted she is having to take a few more breaks with working in her garden she used to she does not feel short of breath but she just gets more tired easily.  She has smoked for about 40 years.  She says her insomnia actually has gotten significantly better.  She is not sure why but it is better.  She also c/o of flare with TMJ on her left side. Says it has been painful and bothersome for the last 2 weeks.    No past medical history on file.  Past Surgical History:  Procedure Laterality Date  . ABDOMINAL HYSTERECTOMY     partial  . BREAST ENHANCEMENT SURGERY    . BREAST EXCISIONAL BIOPSY Right   . CAPSULOTOMY Right 08/01/2019  . CATARACT EXTRACTION Right   . HAND SURGERY  2006, 07  . NECK SURGERY  2002    Family History  Problem Relation Age of Onset  . Hypertension Mother   . Arthritis Mother        osteo  . Pulmonary embolism Mother   . Hyperlipidemia Sister     Social History   Socioeconomic History  . Marital status: Married    Spouse name: Not on file  . Number of children: Not on  file  . Years of education: Not on file  . Highest education level: Not on file  Occupational History  . Not on file  Tobacco Use  . Smoking status: Former Research scientist (life sciences)  . Smokeless tobacco: Never Used  Substance and Sexual Activity  . Alcohol use: Yes  . Drug use: No  . Sexual activity: Not on file  Other Topics Concern  . Not on file  Social History Narrative  . Not on file   Social Determinants of Health   Financial Resource Strain: Not on file  Food Insecurity: Not on file  Transportation Needs: Not on file  Physical Activity: Not on file  Stress: Not on file  Social Connections: Not on file  Intimate Partner Violence: Not on file    Outpatient Medications Prior to Visit  Medication Sig Dispense Refill  . calcium carbonate (OSCAL) 1500 (600 Ca) MG TABS tablet Take 600 mg of elemental calcium by mouth daily with breakfast.    . omeprazole (PRILOSEC) 20 MG capsule TAKE 1 CAPSULE(20 MG) BY MOUTH DAILY 90 capsule 1  . rosuvastatin (CRESTOR) 10 MG tablet TAKE 1 TABLET BY MOUTH EVERY DAY 90 tablet 3   No facility-administered medications prior to visit.  Allergies  Allergen Reactions  . Lipitor [Atorvastatin] Other (See Comments)    Myalgias, lightheadedness, headaches, cramping    ROS Review of Systems    Objective:    Physical Exam Constitutional:      Appearance: She is well-developed.  HENT:     Head: Normocephalic and atraumatic.  Cardiovascular:     Rate and Rhythm: Normal rate and regular rhythm.     Heart sounds: Normal heart sounds.  Pulmonary:     Effort: Pulmonary effort is normal.     Breath sounds: Normal breath sounds.  Skin:    General: Skin is warm and dry.  Neurological:     Mental Status: She is alert and oriented to person, place, and time.  Psychiatric:        Behavior: Behavior normal.     BP (!) 133/57   Pulse 67   Ht 5\' 3"  (1.6 m)   Wt 145 lb (65.8 kg)   SpO2 99%   BMI 25.69 kg/m  Wt Readings from Last 3 Encounters:   08/16/20 145 lb (65.8 kg)  02/07/20 144 lb (65.3 kg)  07/25/19 141 lb (64 kg)     Health Maintenance Due  Topic Date Due  . TETANUS/TDAP  08/31/2018    There are no preventive care reminders to display for this patient.  Lab Results  Component Value Date   TSH 1.505 10/25/2008   Lab Results  Component Value Date   WBC 5.3 01/24/2019   HGB 14.1 01/24/2019   HCT 41.7 01/24/2019   MCV 93.7 01/24/2019   PLT 209 01/24/2019   Lab Results  Component Value Date   NA 140 02/07/2020   K 4.2 02/07/2020   CO2 25 02/07/2020   GLUCOSE 94 02/07/2020   BUN 11 02/07/2020   CREATININE 0.94 (H) 02/07/2020   BILITOT 0.7 02/07/2020   ALKPHOS 99 11/19/2015   AST 23 02/07/2020   ALT 18 02/07/2020   PROT 6.8 02/07/2020   ALBUMIN 4.4 11/19/2015   CALCIUM 10.1 02/07/2020   Lab Results  Component Value Date   CHOL 186 02/07/2020   Lab Results  Component Value Date   HDL 50 02/07/2020   Lab Results  Component Value Date   LDLCALC 109 (H) 02/07/2020   Lab Results  Component Value Date   TRIG 155 (H) 02/07/2020   Lab Results  Component Value Date   CHOLHDL 3.7 02/07/2020   Lab Results  Component Value Date   HGBA1C 5.8 (A) 08/16/2020      Assessment & Plan:   Problem List Items Addressed This Visit      Endocrine   IFG (impaired fasting glucose) - Primary    Well controlled. Continue current regimen. Follow up in  6 months.       Relevant Orders   POCT glycosylated hemoglobin (Hb A1C) (Completed)     Musculoskeletal and Integument   Osteopenia    Encouraged her to take a calcium with vitamin D daily she says she already takes 1 which is great.        Other   TMJ tenderness, left    Discussed trial of muscle relaxer at night to see if helps. We discussed mouth guard as well . She doesn't currently use one.        Relevant Medications   tiZANidine (ZANAFLEX) 4 MG tablet   Polyarthralgia    Complains of joint pain that can move around it can sometimes be  in her knees shoulders etc.  We discussed just making sure that she is staying active and getting into a good stretching routine daily to help with her arthritis.       Other Visit Diagnoses    Palpitations         Palpitations-she only had a single episode and has not had any other symptoms such as chest pain swelling etc.  I do want her to keep an eye on it if it happens again please let me know and we can definitely work it up further.  She thinks she may have just had a bad dream that caused it but she is really not sure.  Fatigues easily with exercise I suspect that she probably has some underlying COPD.  Like to schedule her for spirometry this summer.  She will come back and do this here in our office.  She has smoked for decades.  Meds ordered this encounter  Medications  . tiZANidine (ZANAFLEX) 4 MG tablet    Sig: Take 0.5-1 tablets (2-4 mg total) by mouth at bedtime as needed for muscle spasms.    Dispense:  30 tablet    Refill:  0    Follow-up: Return in about 3 months (around 11/15/2020) for spirometry .    [name], MD

## 2020-08-16 NOTE — Patient Instructions (Addendum)
Please schedule your Tdap at your pharmacy.    Vitamins recommended: In general One-A-Day women's multivitamin once a day Calcium with vitamin D twice a day   Please come back this summer for spirometry/breathing test.  Please schedule at your convenience.

## 2020-08-22 ENCOUNTER — Ambulatory Visit
Admission: RE | Admit: 2020-08-22 | Discharge: 2020-08-22 | Disposition: A | Discharge status: Home / Self Care | Payer: Medicare Other | Source: Ambulatory Visit | Attending: Family Medicine | Admitting: Family Medicine

## 2020-08-22 ENCOUNTER — Other Ambulatory Visit: Payer: Self-pay

## 2020-08-22 DIAGNOSIS — N6489 Other specified disorders of breast: Secondary | ICD-10-CM | POA: Diagnosis not present

## 2020-08-22 DIAGNOSIS — Z853 Personal history of malignant neoplasm of breast: Secondary | ICD-10-CM | POA: Diagnosis not present

## 2020-08-22 DIAGNOSIS — R922 Inconclusive mammogram: Secondary | ICD-10-CM | POA: Diagnosis not present

## 2020-10-29 ENCOUNTER — Other Ambulatory Visit: Payer: Self-pay

## 2020-10-29 ENCOUNTER — Ambulatory Visit (INDEPENDENT_AMBULATORY_CARE_PROVIDER_SITE_OTHER): Payer: Medicare Other | Admitting: Physician Assistant

## 2020-10-29 VITALS — BP 128/72 | HR 82 | Ht 62.5 in | Wt 148.1 lb

## 2020-10-29 DIAGNOSIS — Z0000 Encounter for general adult medical examination without abnormal findings: Secondary | ICD-10-CM

## 2020-10-29 NOTE — Patient Instructions (Addendum)
[facility] Maintenance Summary and Written Plan of Care  Ms. Bryars ,  Thank you for allowing me to perform your Medicare Annual Wellness Visit and for your ongoing commitment to your health.   Health Maintenance & Immunization History Health Maintenance  Topic Date Due   COVID-19 Vaccine (4 - Booster for Pfizer series) 11/14/2020 (Originally 07/01/2020)   Zoster Vaccines- Shingrix (2 of 2) 01/29/2021 (Originally 04/25/2020)   TETANUS/TDAP  10/29/2021 (Originally 08/31/2018)   INFLUENZA VACCINE  12/10/2020   COLONOSCOPY (Pts 45-66yrs Insurance coverage will need to be confirmed)  01/16/2021   MAMMOGRAM  08/23/2022   DEXA SCAN  Completed   Hepatitis C Screening  Completed   PNA vac Low Risk Adult  Completed   HPV VACCINES  Aged Out   Immunization History  Administered Date(s) Administered   Fluad Quad(high Dose 65+) 02/07/2020   Influenza Split 02/20/2011, 02/05/2012   Influenza Whole 03/31/2007, 02/24/2008, 05/18/2009, 02/21/2010   Influenza, High Dose Seasonal PF 01/22/2017, 02/10/2019   Influenza,inj,Quad PF,6+ Mos 02/02/2013, 02/23/2014, 12/27/2014, 02/28/2016, 01/21/2018   PFIZER(Purple Top)SARS-COV-2 Vaccination 06/07/2019, 06/28/2019, 02/29/2020   Pneumococcal Conjugate-13 12/27/2014   Pneumococcal Polysaccharide-23 02/02/2013   Td 08/30/2008   Zoster Recombinat (Shingrix) 02/29/2020    These are the patient goals that we discussed:  Goals Addressed               This Visit's Progress     Patient Stated (pt-stated)        10/29/2020 AWV Goal: Exercise for General Health  Patient will verbalize understanding of the benefits of increased physical activity: Exercising regularly is important. It will improve your overall fitness, flexibility, and endurance. Regular exercise also will improve your overall health. It can help you control your weight, reduce stress, and improve your bone density. Over the next year, patient will increase  physical activity as tolerated with a goal of at least 150 minutes of moderate physical activity per week.  You can tell that you are exercising at a moderate intensity if your heart starts beating faster and you start breathing faster but can still hold a conversation. Moderate-intensity exercise ideas include: Walking 1 mile (1.6 km) in about 15 minutes Biking Hiking Golfing Dancing Water aerobics Patient will verbalize understanding of everyday activities that increase physical activity by providing examples like the following: Yard work, such as: Sales promotion account executive Gardening Washing windows or floors Patient will be able to explain general safety guidelines for exercising:  Before you start a new exercise program, talk with your health care provider. Do not exercise so much that you hurt yourself, feel dizzy, or get very short of breath. Wear comfortable clothes and wear shoes with good support. Drink plenty of water while you exercise to prevent dehydration or heat stroke. Work out until your breathing and your heartbeat get faster.           This is a list of Health Maintenance Items that are overdue or due now: Td vaccine Shingrix (2nd dose) Colonoscopy (due in September, 2022)   Orders/Referrals Placed Today: No orders of the defined types were placed in this encounter.  (Contact our referral department at [phone] if you have not spoken with someone about your referral appointment within the next 5 days)    Follow-up Plan Follow-up with Hali Marry, MD as planned Schedule your tetanus shot and your shingrix (2nd dose) at your pharmacy. Medicare wellness  in one year. AVS printed.    Health Maintenance, Female Adopting a healthy lifestyle and getting preventive care are important in promoting health and wellness. Ask your health care provider about: The right schedule  for you to have regular tests and exams. Things you can do on your own to prevent diseases and keep yourself healthy. What should I know about diet, weight, and exercise? Eat a healthy diet  Eat a diet that includes plenty of vegetables, fruits, low-fat dairy products, and lean protein. Do not eat a lot of foods that are high in solid fats, added sugars, or sodium.  Maintain a healthy weight Body mass index (BMI) is used to identify weight problems. It estimates body fat based on height and weight. Your health care provider can help determineyour BMI and help you achieve or maintain a healthy weight. Get regular exercise Get regular exercise. This is one of the most important things you can do for your health. Most adults should: Exercise for at least 150 minutes each week. The exercise should increase your heart rate and make you sweat (moderate-intensity exercise). Do strengthening exercises at least twice a week. This is in addition to the moderate-intensity exercise. Spend less time sitting. Even light physical activity can be beneficial. Watch cholesterol and blood lipids Have your blood tested for lipids and cholesterol at 75 years of age, then havethis test every 5 years. Have your cholesterol levels checked more often if: Your lipid or cholesterol levels are high. You are older than 75 years of age. You are at high risk for heart disease. What should I know about cancer screening? Depending on your health history and family history, you may need to have cancer screening at various ages. This may include screening for: Breast cancer. Cervical cancer. Colorectal cancer. Skin cancer. Lung cancer. What should I know about heart disease, diabetes, and high blood pressure? Blood pressure and heart disease High blood pressure causes heart disease and increases the risk of stroke. This is more likely to develop in people who have high blood pressure readings, are of African descent, or  are overweight. Have your blood pressure checked: Every 3-5 years if you are 36-41 years of age. Every year if you are 81 years old or older. Diabetes Have regular diabetes screenings. This checks your fasting blood sugar level. Have the screening done: Once every three years after age 68 if you are at a normal weight and have a low risk for diabetes. More often and at a younger age if you are overweight or have a high risk for diabetes. What should I know about preventing infection? Hepatitis B If you have a higher risk for hepatitis B, you should be screened for this virus. Talk with your health care provider to find out if you are at risk forhepatitis B infection. Hepatitis C Testing is recommended for: Everyone born from [birth year] through 1965. Anyone with known risk factors for hepatitis C. Sexually transmitted infections (STIs) Get screened for STIs, including gonorrhea and chlamydia, if: You are sexually active and are younger than 75 years of age. You are older than 75 years of age and your health care provider tells you that you are at risk for this type of infection. Your sexual activity has changed since you were last screened, and you are at increased risk for chlamydia or gonorrhea. Ask your health care provider if you are at risk. Ask your health care provider about whether you are at high risk for HIV. Your health  care provider may recommend a prescription medicine to help prevent HIV infection. If you choose to take medicine to prevent HIV, you should first get tested for HIV. You should then be tested every 3 months for as long as you are taking the medicine. Pregnancy If you are about to stop having your period (premenopausal) and you may become pregnant, seek counseling before you get pregnant. Take 400 to 800 micrograms (mcg) of folic acid every day if you become pregnant. Ask for birth control (contraception) if you want to prevent pregnancy. Osteoporosis and  menopause Osteoporosis is a disease in which the bones lose minerals and strength with aging. This can result in bone fractures. If you are 54 years old or older, or if you are at risk for osteoporosis and fractures, ask your health care provider if you should: Be screened for bone loss. Take a calcium or vitamin D supplement to lower your risk of fractures. Be given hormone replacement therapy (HRT) to treat symptoms of menopause. Follow these instructions at home: Lifestyle Do not use any products that contain nicotine or tobacco, such as cigarettes, e-cigarettes, and chewing tobacco. If you need help quitting, ask your health care provider. Do not use street drugs. Do not share needles. Ask your health care provider for help if you need support or information about quitting drugs. Alcohol use Do not drink alcohol if: Your health care provider tells you not to drink. You are pregnant, may be pregnant, or are planning to become pregnant. If you drink alcohol: Limit how much you use to 0-1 drink a day. Limit intake if you are breastfeeding. Be aware of how much alcohol is in your drink. In the [place] one drink equals one 12 oz bottle of beer (355 mL), one 5 oz glass of wine (148 mL), or one 1 oz glass of hard liquor (44 mL). General instructions Schedule regular health, dental, and eye exams. Stay current with your vaccines. Tell your health care provider if: You often feel depressed. You have ever been abused or do not feel safe at home. Summary Adopting a healthy lifestyle and getting preventive care are important in promoting health and wellness. Follow your health care provider's instructions about healthy diet, exercising, and getting tested or screened for diseases. Follow your health care provider's instructions on monitoring your cholesterol and blood pressure. This information is not intended to replace advice given to you by your health care provider. Make sure you discuss any  questions you have with your healthcare provider. Document Revised: 04/21/2018 Document Reviewed: 04/21/2018 Elsevier Patient Education  2022 [employer].

## 2020-10-29 NOTE — Progress Notes (Signed)
MEDICARE ANNUAL WELLNESS VISIT  10/29/2020  Subjective:  Victoria Benjamin is a 75 y.o. female patient of Metheney, Rene Kocher, MD who had a Medicare Annual Wellness Visit today. Victoria Benjamin is Retired and lives alone. she has 2 children. she reports that she is socially active and does interact with friends/family regularly. she is moderately physically active and enjoys yard work.  Patient Care Team: Hali Marry, MD as PCP - General  Advanced Directives 10/29/2020 11/29/2014 05/25/2014  Does Patient Have a Medical Advance Directive? Yes Yes Yes  Type of Paramedic of [place];Living will - [place];Living will  Does patient want to make changes to medical advance directive? - - No - Patient declined  Copy of [place] in Chart? No - copy requested No - copy requested No - copy requested    Hospital Utilization Over the Past 12 Months: # of hospitalizations or ER visits: 0 # of surgeries: 0  Review of Systems    Patient reports that her overall health is unchanged when compared to last year.  Review of Systems: History obtained from chart review and the patient  All other systems negative.  Pain Assessment Pain : No/denies pain     Current Medications & Allergies (verified) Allergies as of 10/29/2020       Reactions   Lipitor [atorvastatin] Other (See Comments)   Myalgias, lightheadedness, headaches, cramping        Medication List        Accurate as of October 29, 2020  9:28 AM. If you have any questions, ask your nurse or doctor.          calcium carbonate 1500 (600 Ca) MG Tabs tablet Commonly known as: OSCAL Take 600 mg of elemental calcium by mouth daily with breakfast.   omeprazole 20 MG capsule Commonly known as: PRILOSEC TAKE 1 CAPSULE(20 MG) BY MOUTH DAILY   rosuvastatin 10 MG tablet Commonly known as: CRESTOR TAKE 1 TABLET BY MOUTH EVERY DAY   tiZANidine 4 MG tablet Commonly  known as: Zanaflex Take 0.5-1 tablets (2-4 mg total) by mouth at bedtime as needed for muscle spasms.        History (reviewed): History reviewed. No pertinent past medical history. Past Surgical History:  Procedure Laterality Date   ABDOMINAL HYSTERECTOMY     partial   BREAST ENHANCEMENT SURGERY     BREAST EXCISIONAL BIOPSY Right    CAPSULOTOMY Right 08/01/2019   CATARACT EXTRACTION Right    HAND SURGERY  2006, 07   NECK SURGERY  2002   Family History  Problem Relation Age of Onset   Hypertension Mother    Arthritis Mother        osteo   Pulmonary embolism Mother    Hyperlipidemia Sister    Social History   Socioeconomic History   Marital status: Legally Separated    Spouse name: Not on file   Number of children: 2   Years of education: 12   Highest education level: 12th grade  Occupational History    Comment: Retired  Tobacco Use   Smoking status: Former    Pack years: 0.00   Smokeless tobacco: Never  Substance and Sexual Activity   Alcohol use: Yes    Alcohol/week: 9.0 standard drinks    Types: 9 Glasses of wine per week   Drug use: No   Sexual activity: Not on file  Other Topics Concern   Not on file  Social History Narrative  Lives alone. She has two children, one does live close by. She enjoys doing yard work in her free time.   Social Determinants of Health   Financial Resource Strain: Low Risk    Difficulty of Paying Living Expenses: Not hard at all  Food Insecurity: No Food Insecurity   Worried About Charity fundraiser in the Last Year: Never true   [place] in the Last Year: Never true  Transportation Needs: No Transportation Needs   Lack of Transportation (Medical): No   Lack of Transportation (Non-Medical): No  Physical Activity: Sufficiently Active   Days of Exercise per Week: 7 days   Minutes of Exercise per Session: 40 min  Stress: No Stress Concern Present   Feeling of Stress : Not at all  Social Connections: Moderately  Isolated   Frequency of Communication with Friends and Family: Three times a week   Frequency of Social Gatherings with Friends and Family: More than three times a week   Attends Religious Services: More than 4 times per year   Active Member of [employer] or Organizations: No   Attends Archivist Meetings: Never   Marital Status: Separated    Activities of Daily Living In your present state of health, do you have any difficulty performing the following activities: 10/29/2020  Hearing? N  Vision? N  Difficulty concentrating or making decisions? N  Walking or climbing stairs? N  Dressing or bathing? N  Doing errands, shopping? N  Preparing Food and eating ? N  Using the Toilet? N  In the past six months, have you accidently leaked urine? Y  Comment she does wear a panty liner but hasnt noticed a lot of incontinence  Do you have problems with loss of bowel control? N  Managing your Medications? N  Managing your Finances? N  Housekeeping or managing your Housekeeping? N  Some recent data might be hidden    Patient Education/Literacy How often do you need to have someone help you when you read instructions, pamphlets, or other written materials from your doctor or pharmacy?: 1 - Never What is the last grade level you completed in school?: 12th grade  Exercise Current Exercise Habits: Home exercise routine, Type of exercise: walking, Time (Minutes): 40, Frequency (Times/Week): >7, Weekly Exercise (Minutes/Week): 0, Intensity: Moderate, Exercise limited by: None identified  Diet Patient reports consuming 2 meals a day and 1 snack(s) a day Patient reports that her primary diet is: Regular Patient reports that she does have regular access to food.   Depression Screen PHQ 2/9 Scores 10/29/2020 08/16/2020 02/07/2020 01/21/2018 07/23/2017 01/22/2017 06/29/2015  PHQ - 2 Score 0 2 2 1  0 3 0  PHQ- 9 Score - 5 4 1  - 7 -     Fall Risk Fall Risk  10/29/2020 08/16/2020 02/07/2020 04/06/2019  01/21/2018  Falls in the past year? 0 0 0 0 No  Comment - - - Emmi Telephone Survey: data to providers prior to load -  Number falls in past yr: 0 - - - -  Injury with Fall? 0 - - - -  Risk for fall due to : No Fall Risks No Fall Risks No Fall Risks - -  Follow up Falls evaluation completed Falls evaluation completed - - -     Objective:   BP 128/72 (BP Location: Right Arm, Patient Position: Sitting, Cuff Size: Normal)   Pulse 82   Ht 5' 2.5" (1.588 m)   Wt 148 lb 1.3 oz (67.2  kg)   SpO2 97%   BMI 26.65 kg/m   Last Weight  Most recent update: 10/29/2020  9:04 AM    Weight  67.2 kg (148 lb 1.3 oz)             Body mass index is 26.65 kg/m.  Hearing/Vision  Nimisha did not have difficulty with hearing/understanding during the face-to-face interview Ariela did not have difficulty with her vision during the face-to-face interview Reports that she has had a formal eye exam by an eye care professional within the past year Reports that she has not had a formal hearing evaluation within the past year  Cognitive Function: 6CIT Screen 10/29/2020  What Year? 0 points  What month? 0 points  What time? 0 points  Count back from 20 0 points  Months in reverse 0 points  Repeat phrase 0 points  Total Score 0    Normal Cognitive Function Screening: Yes (Normal:0-7, Significant for Dysfunction: >8)  Immunization & Health Maintenance Record Immunization History  Administered Date(s) Administered   Fluad Quad(high Dose 65+) 02/07/2020   Influenza Split 02/20/2011, 02/05/2012   Influenza Whole 03/31/2007, 02/24/2008, 05/18/2009, 02/21/2010   Influenza, High Dose Seasonal PF 01/22/2017, 02/10/2019   Influenza,inj,Quad PF,6+ Mos 02/02/2013, 02/23/2014, 12/27/2014, 02/28/2016, 01/21/2018   PFIZER(Purple Top)SARS-COV-2 Vaccination 06/07/2019, 06/28/2019, 02/29/2020   Pneumococcal Conjugate-13 12/27/2014   Pneumococcal Polysaccharide-23 02/02/2013   Td 08/30/2008   Zoster Recombinat  (Shingrix) 02/29/2020    Health Maintenance  Topic Date Due   COVID-19 Vaccine (4 - Booster for [place] series) 11/14/2020 (Originally 07/01/2020)   Zoster Vaccines- Shingrix (2 of 2) 01/29/2021 (Originally 04/25/2020)   TETANUS/TDAP  10/29/2021 (Originally 08/31/2018)   INFLUENZA VACCINE  12/10/2020   COLONOSCOPY (Pts 45-73yrs Insurance coverage will need to be confirmed)  01/16/2021   MAMMOGRAM  08/23/2022   DEXA SCAN  Completed   Hepatitis C Screening  Completed   PNA vac Low Risk Adult  Completed   HPV VACCINES  Aged Out       Assessment  This is a routine wellness examination for [employer].  Health Maintenance: Due or Overdue There are no preventive care reminders to display for this patient.   Theodosia Blender does not need a referral for Community Assistance: Care Management:   no Social Work:    no Prescription Assistance:  no Nutrition/Diabetes Education:  no   Plan:  Personalized Goals  Goals Addressed               This Visit's Progress     Patient Stated (pt-stated)        10/29/2020 AWV Goal: Exercise for General Health  Patient will verbalize understanding of the benefits of increased physical activity: Exercising regularly is important. It will improve your overall fitness, flexibility, and endurance. Regular exercise also will improve your overall health. It can help you control your weight, reduce stress, and improve your bone density. Over the next year, patient will increase physical activity as tolerated with a goal of at least 150 minutes of moderate physical activity per week.  You can tell that you are exercising at a moderate intensity if your heart starts beating faster and you start breathing faster but can still hold a conversation. Moderate-intensity exercise ideas include: Walking 1 mile (1.6 km) in about 15 minutes Biking Hiking Golfing Dancing Water aerobics Patient will verbalize understanding of everyday activities that  increase physical activity by providing examples like the following: Yard work, such as: Psychologist, educational  Raking and bagging leaves Washing your car Pushing a stroller Shoveling snow Gardening Washing windows or floors Patient will be able to explain general safety guidelines for exercising:  Before you start a new exercise program, talk with your health care provider. Do not exercise so much that you hurt yourself, feel dizzy, or get very short of breath. Wear comfortable clothes and wear shoes with good support. Drink plenty of water while you exercise to prevent dehydration or heat stroke. Work out until your breathing and your heartbeat get faster.         Personalized Health Maintenance & Screening Recommendations  Td vaccine Shingrix (2nd dose) Colonoscopy (due in September, 2022)  Lung Cancer Screening Recommended: no (Low Dose CT Chest recommended if Age 24-80 years, 30 pack-year currently smoking OR have quit w/in past 15 years) Hepatitis C Screening recommended: no HIV Screening recommended: no  Advanced Directives: Written information was not given per the patient's request.  Referrals & Orders No orders of the defined types were placed in this encounter.   Follow-up Plan Follow-up with Hali Marry, MD as planned Schedule your tetanus shot and your shingrix (2nd dose) at your pharmacy. Medicare wellness in one year. AVS printed.   I have personally reviewed and noted the following in the patient's chart:   Medical and social history Use of alcohol, tobacco or illicit drugs  Current medications and supplements Functional ability and status Nutritional status Physical activity Advanced directives List of other physicians Hospitalizations, surgeries, and ER visits in previous 12 months Vitals Screenings to include cognitive, depression, and falls Referrals and appointments  In addition, I have reviewed and discussed with patient certain  preventive protocols, quality metrics, and best practice recommendations. A written personalized care plan for preventive services as well as general preventive health recommendations were provided to patient.     Tinnie Gens, RN  10/29/2020

## 2020-11-15 ENCOUNTER — Other Ambulatory Visit: Payer: Self-pay

## 2020-11-15 ENCOUNTER — Encounter: Payer: Self-pay | Admitting: Family Medicine

## 2020-11-15 ENCOUNTER — Ambulatory Visit (INDEPENDENT_AMBULATORY_CARE_PROVIDER_SITE_OTHER): Payer: Medicare Other | Admitting: Family Medicine

## 2020-11-15 ENCOUNTER — Ambulatory Visit (INDEPENDENT_AMBULATORY_CARE_PROVIDER_SITE_OTHER): Payer: Medicare Other

## 2020-11-15 VITALS — BP 138/73 | HR 86 | Resp 18 | Ht 62.25 in | Wt 146.0 lb

## 2020-11-15 DIAGNOSIS — R0602 Shortness of breath: Secondary | ICD-10-CM | POA: Diagnosis not present

## 2020-11-15 DIAGNOSIS — J449 Chronic obstructive pulmonary disease, unspecified: Secondary | ICD-10-CM | POA: Diagnosis not present

## 2020-11-15 DIAGNOSIS — R0689 Other abnormalities of breathing: Secondary | ICD-10-CM

## 2020-11-15 MED ORDER — ALBUTEROL SULFATE HFA 108 (90 BASE) MCG/ACT IN AERS
4.0000 | INHALATION_SPRAY | Freq: Once | RESPIRATORY_TRACT | Status: AC
  Administered 2020-11-15: 4 via RESPIRATORY_TRACT

## 2020-11-15 MED ORDER — SPIRIVA HANDIHALER 18 MCG IN CAPS: 18.0000 ug | ORAL_CAPSULE | Freq: Every day | RESPIRATORY_TRACT | 12 refills | Status: DC

## 2020-11-15 NOTE — Progress Notes (Signed)
Established Patient Office Visit  Subjective:  Patient ID: Victoria Benjamin, female    DOB: [birth year]  Age: 75 y.o. MRN: [id number]  CC:  Chief Complaint  Patient presents with   Fatigue    HPI Victoria Benjamin presents for spirometry for shortness of breath she was recently here for her Medicare wellness exam and noted that she is having to take a few more breaks and rest when she is working in the garden.  She does not feel discretely short of breath but just gets more tired more easily.  Prior smoker.  Quit 15 years ago.  Has had exposure to secondhand smoke as well.  Last chest x-ray was over a decade ago.    No past medical history on file.  Past Surgical History:  Procedure Laterality Date   ABDOMINAL HYSTERECTOMY     partial   BREAST ENHANCEMENT SURGERY     BREAST EXCISIONAL BIOPSY Right    CAPSULOTOMY Right 08/01/2019   CATARACT EXTRACTION Right    HAND SURGERY  2006, 07   NECK SURGERY  2002    Family History  Problem Relation Age of Onset   Hypertension Mother    Arthritis Mother        osteo   Pulmonary embolism Mother    Hyperlipidemia Sister     Social History   Socioeconomic History   Marital status: Legally Separated    Spouse name: Not on file   Number of children: 2   Years of education: 12   Highest education level: 12th grade  Occupational History    Comment: Retired  Tobacco Use   Smoking status: Former    Pack years: 0.00   Smokeless tobacco: Never  Substance and Sexual Activity   Alcohol use: Yes    Alcohol/week: 9.0 standard drinks    Types: 9 Glasses of wine per week   Drug use: No   Sexual activity: Not on file  Other Topics Concern   Not on file  Social History Narrative   Lives alone. She has two children, one does live close by. She enjoys doing yard work in her free time.   Social Determinants of Health   Financial Resource Strain: Low Risk    Difficulty of Paying Living Expenses: Not hard at all  Food Insecurity: No  Food Insecurity   Worried About Charity fundraiser in the Last Year: Never true   Fidelity in the Last Year: Never true  Transportation Needs: No Transportation Needs   Lack of Transportation (Medical): No   Lack of Transportation (Non-Medical): No  Physical Activity: Sufficiently Active   Days of Exercise per Week: 7 days   Minutes of Exercise per Session: 40 min  Stress: No Stress Concern Present   Feeling of Stress : Not at all  Social Connections: Moderately Isolated   Frequency of Communication with Friends and Family: Three times a week   Frequency of Social Gatherings with Friends and Family: More than three times a week   Attends Religious Services: More than 4 times per year   Active Member of [employer] or Organizations: No   Attends Archivist Meetings: Never   Marital Status: Separated  Intimate Partner Violence: Not At Risk   Fear of Current or Ex-Partner: No   Emotionally Abused: No   Physically Abused: No   Sexually Abused: No    Outpatient Medications Prior to Visit  Medication Sig Dispense Refill   calcium carbonate (OSCAL)  1500 (600 Ca) MG TABS tablet Take 600 mg of elemental calcium by mouth daily with breakfast.     omeprazole (PRILOSEC) 20 MG capsule TAKE 1 CAPSULE(20 MG) BY MOUTH DAILY 90 capsule 1   rosuvastatin (CRESTOR) 10 MG tablet TAKE 1 TABLET BY MOUTH EVERY DAY 90 tablet 3   tiZANidine (ZANAFLEX) 4 MG tablet Take 0.5-1 tablets (2-4 mg total) by mouth at bedtime as needed for muscle spasms. 30 tablet 0   No facility-administered medications prior to visit.    Allergies  Allergen Reactions   Lipitor [Atorvastatin] Other (See Comments)    Myalgias, lightheadedness, headaches, cramping    ROS Review of Systems    Objective:    Physical Exam Vitals reviewed.  Constitutional:      Appearance: She is well-developed.  HENT:     Head: Normocephalic and atraumatic.  Eyes:     Conjunctiva/sclera: Conjunctivae normal.   Cardiovascular:     Rate and Rhythm: Normal rate.  Pulmonary:     Effort: Pulmonary effort is normal.  Skin:    General: Skin is dry.     Coloration: Skin is not pale.  Neurological:     Mental Status: She is alert and oriented to person, place, and time.  Psychiatric:        Behavior: Behavior normal.    BP 138/73   Pulse 86   Resp 18   Ht 5' 2.25" (1.581 m)   Wt 146 lb (66.2 kg)   SpO2 95%   BMI 26.49 kg/m  Wt Readings from Last 3 Encounters:  11/15/20 146 lb (66.2 kg)  10/29/20 148 lb 1.3 oz (67.2 kg)  08/16/20 145 lb (65.8 kg)     Health Maintenance Due  Topic Date Due   COVID-19 Vaccine (4 - Booster for Pfizer series) 07/01/2020    There are no preventive care reminders to display for this patient.  Lab Results  Component Value Date   TSH 1.505 10/25/2008   Lab Results  Component Value Date   WBC 5.3 01/24/2019   HGB 14.1 01/24/2019   HCT 41.7 01/24/2019   MCV 93.7 01/24/2019   PLT 209 01/24/2019   Lab Results  Component Value Date   NA 140 02/07/2020   K 4.2 02/07/2020   CO2 25 02/07/2020   GLUCOSE 94 02/07/2020   BUN 11 02/07/2020   CREATININE 0.94 (H) 02/07/2020   BILITOT 0.7 02/07/2020   ALKPHOS 99 11/19/2015   AST 23 02/07/2020   ALT 18 02/07/2020   PROT 6.8 02/07/2020   ALBUMIN 4.4 11/19/2015   CALCIUM 10.1 02/07/2020   Lab Results  Component Value Date   CHOL 186 02/07/2020   Lab Results  Component Value Date   HDL 50 02/07/2020   Lab Results  Component Value Date   LDLCALC 109 (H) 02/07/2020   Lab Results  Component Value Date   TRIG 155 (H) 02/07/2020   Lab Results  Component Value Date   CHOLHDL 3.7 02/07/2020   Lab Results  Component Value Date   HGBA1C 5.8 (A) 08/16/2020      Assessment & Plan:   Problem List Items Addressed This Visit       Respiratory   Chronic obstructive pulmonary disease ([place]) - Primary    New dx.   Spirometry 11-15-2020 shows FVC of 61%, FEV1 of 49% with a ratio of 60.  She did  have 16% improvement in FEV1 after albuterol.  This was the first time she never had a breathing  test.  Discussed putting her on a daily anticholinergic to help maintain lung function as well as a rescue inhaler to use as needed.  We did give her the albuterol MDI that we used here in the office for the procedure.  She can use that as needed for symptoms.  Plan will be to repeat spirometry in 1 year and also like to get an updated chest x-ray film.       Relevant Medications   tiotropium (SPIRIVA HANDIHALER) 18 MCG inhalation capsule   Other Visit Diagnoses     Trouble breathing       Relevant Medications   albuterol (VENTOLIN HFA) 108 (90 Base) MCG/ACT inhaler 4 puff (Completed)   Other Relevant Orders   PR EVAL OF BRONCHOSPASM   DG Chest 2 View       Meds ordered this encounter  Medications   albuterol (VENTOLIN HFA) 108 (90 Base) MCG/ACT inhaler 4 puff   tiotropium (SPIRIVA HANDIHALER) 18 MCG inhalation capsule    Sig: Place 1 capsule (18 mcg total) into inhaler and inhale daily.    Dispense:  30 capsule    Refill:  12    Follow-up: No follow-ups on file.    [name], MD

## 2020-11-15 NOTE — Assessment & Plan Note (Addendum)
New dx.   Spirometry 11-15-2020 shows FVC of 61%, FEV1 of 49% with a ratio of 60.  She did have 16% improvement in FEV1 after albuterol.  This was the first time she never had a breathing test.  Discussed putting her on a daily anticholinergic to help maintain lung function as well as a rescue inhaler to use as needed.  We did give her the albuterol MDI that we used here in the office for the procedure.  She can use that as needed for symptoms.  Plan will be to repeat spirometry in 1 year and also like to get an updated chest x-ray film.

## 2020-11-20 ENCOUNTER — Telehealth: Payer: Self-pay | Admitting: *Deleted

## 2020-11-20 NOTE — Telephone Encounter (Signed)
Pt wanted to know what her COPD stage is?

## 2020-11-21 NOTE — Telephone Encounter (Signed)
CAT scoring done. Pt stated that she hasn't been using the Spiriva because she is afraid of taking this daily and because it's so expensive.   I told her that she would have to take this medication daily so that she could better manage her breathing. And by doing things such as medication management and lifestyle changes this would help her more.   Fwd to pcp for f/u

## 2020-11-21 NOTE — Telephone Encounter (Signed)
Have her complete a CAT score and I can better Stage her.  Thank you

## 2020-11-28 NOTE — Telephone Encounter (Signed)
CAT Score 11/21/2020  Total CAT Score 8    Please call patient and let her know that her COPD stage is a based on the new gold criteria.  Which means that her COPD has a minimal impact on her current quality of life.  In the old staging system she would be borderline between stage II and stage III.  With stage II the current recommendation is a long-acting bronchodilator daily.  This would be the Spiriva.  The goal of the Spiriva is to help her maintain her current lung function so that she does not get worse over time.  If it is expensive then I would also encourage her to check with her insurance to see if there may be something comparable that is more cost effective based on their formulary.  We are not in the know about formularies so she would have to check to see what is best covered but if she were to call them and asked them for something that similar to Spiriva they should be able to give them a list we can certainly change the medication to something more affordable.

## 2020-12-03 NOTE — Telephone Encounter (Signed)
Pt advised of results/recommendations. She will call her insurance company to find out about changing the Spiriva to something more affordable.

## 2020-12-10 ENCOUNTER — Other Ambulatory Visit: Payer: Self-pay

## 2020-12-10 ENCOUNTER — Emergency Department
Admission: EM | Admit: 2020-12-10 | Discharge: 2020-12-10 | Disposition: A | Discharge status: Home / Self Care | Payer: Medicare Other | Source: Home / Self Care

## 2020-12-10 DIAGNOSIS — M545 Low back pain, unspecified: Secondary | ICD-10-CM

## 2020-12-10 HISTORY — DX: Chronic obstructive pulmonary disease, unspecified: J44.9

## 2020-12-10 LAB — POCT URINALYSIS DIP (MANUAL ENTRY)
Bilirubin, UA: NEGATIVE
Blood, UA: NEGATIVE
Glucose, UA: NEGATIVE mg/dL
Ketones, POC UA: NEGATIVE mg/dL
Nitrite, UA: NEGATIVE
Protein Ur, POC: NEGATIVE mg/dL
Spec Grav, UA: 1.01 (ref 1.010–1.025)
Urobilinogen, UA: 0.2 E.U./dL
pH, UA: 6 (ref 5.0–8.0)

## 2020-12-10 MED ORDER — TIZANIDINE HCL 4 MG PO TABS: 4.0000 mg | ORAL_TABLET | Freq: Four times a day (QID) | ORAL | 0 refills | Status: DC | PRN

## 2020-12-10 MED ORDER — ONDANSETRON HCL 4 MG PO TABS: 4.0000 mg | ORAL_TABLET | Freq: Three times a day (TID) | ORAL | 0 refills | Status: DC | PRN

## 2020-12-10 MED ORDER — TIZANIDINE HCL 4 MG PO TABS: 2.0000 mg | ORAL_TABLET | Freq: Every evening | ORAL | 0 refills | Status: DC | PRN

## 2020-12-10 MED ORDER — HYDROCODONE-ACETAMINOPHEN 5-325 MG PO TABS: 1.0000 | ORAL_TABLET | Freq: Four times a day (QID) | ORAL | 0 refills | Status: DC | PRN

## 2020-12-10 NOTE — Discharge Instructions (Addendum)
Use ice or heat to painful back muscles May take Tylenol, Advil, Aleve for moderate pain Take hydrocodone for severe pain.  Take with food I have written a prescription for Zofran (ondansetron) if needed for nausea I have refilled your tizanidine (muscle relaxer) for as needed use

## 2020-12-10 NOTE — ED Triage Notes (Signed)
Pt st that she has had back pain in R lower back since Saturday morning at 3 am . Pt st she has been taking tylenol over the counter for the pain but has become worse and she is having a hard time sleeping. Pt st she took some old tramadol 25 mg to deal with the pain but it made her nauseated. Pt declined any pain with urination, abd pain, and urgency. Pt st she recently has started [place] and has some questions.

## 2020-12-24 ENCOUNTER — Other Ambulatory Visit: Payer: Self-pay | Admitting: Family Medicine

## 2021-02-15 ENCOUNTER — Ambulatory Visit (INDEPENDENT_AMBULATORY_CARE_PROVIDER_SITE_OTHER): Payer: Medicare Other | Admitting: Family Medicine

## 2021-02-15 VITALS — Temp 97.7°F

## 2021-02-15 DIAGNOSIS — Z23 Encounter for immunization: Secondary | ICD-10-CM

## 2021-02-15 NOTE — Progress Notes (Signed)
   Subjective:    Patient ID: Victoria Benjamin, female    DOB: [birth year], 75 y.o.   MRN: [id number]  HPI Patient presents today for her flu vaccine. She is afebrile and asymptomatic therefore will proceed with injection.    Review of Systems     Objective:   Physical Exam        Assessment & Plan:  Patient tolerated injection well without complication.

## 2021-03-15 ENCOUNTER — Other Ambulatory Visit: Payer: Self-pay | Admitting: Family Medicine

## 2021-03-15 DIAGNOSIS — N6489 Other specified disorders of breast: Secondary | ICD-10-CM

## 2021-04-23 ENCOUNTER — Ambulatory Visit
Admission: RE | Admit: 2021-04-23 | Discharge: 2021-04-23 | Disposition: A | Discharge status: Home / Self Care | Payer: Medicare Other | Source: Ambulatory Visit | Attending: Family Medicine | Admitting: Family Medicine

## 2021-04-23 ENCOUNTER — Other Ambulatory Visit: Payer: Self-pay | Admitting: Family Medicine

## 2021-04-23 DIAGNOSIS — R922 Inconclusive mammogram: Secondary | ICD-10-CM | POA: Diagnosis not present

## 2021-04-23 DIAGNOSIS — N6489 Other specified disorders of breast: Secondary | ICD-10-CM

## 2021-05-01 ENCOUNTER — Encounter: Payer: Self-pay | Admitting: Family Medicine

## 2021-05-01 ENCOUNTER — Other Ambulatory Visit: Payer: Self-pay

## 2021-05-01 ENCOUNTER — Ambulatory Visit (INDEPENDENT_AMBULATORY_CARE_PROVIDER_SITE_OTHER): Payer: Medicare Other | Admitting: Family Medicine

## 2021-05-01 VITALS — BP 135/73 | HR 81 | Temp 98.2°F | Resp 16 | Ht 62.0 in | Wt 150.0 lb

## 2021-05-01 DIAGNOSIS — J449 Chronic obstructive pulmonary disease, unspecified: Secondary | ICD-10-CM

## 2021-05-01 DIAGNOSIS — J22 Unspecified acute lower respiratory infection: Secondary | ICD-10-CM | POA: Diagnosis not present

## 2021-05-01 DIAGNOSIS — R051 Acute cough: Secondary | ICD-10-CM

## 2021-05-01 MED ORDER — PREDNISONE 20 MG PO TABS: 40.0000 mg | ORAL_TABLET | Freq: Every day | ORAL | 0 refills | Status: DC

## 2021-05-01 MED ORDER — AZITHROMYCIN 250 MG PO TABS: ORAL_TABLET | ORAL | 0 refills | Status: AC

## 2021-05-01 MED ORDER — ALBUTEROL SULFATE HFA 108 (90 BASE) MCG/ACT IN AERS: 2.0000 | INHALATION_SPRAY | Freq: Four times a day (QID) | RESPIRATORY_TRACT | 0 refills | Status: DC | PRN

## 2021-05-01 NOTE — Assessment & Plan Note (Signed)
Right and treat with azithromycin.  She is not having a lot of shortness of breath or increased wheezing.  This Blanch Media has some wheezing at baseline.  But we did discuss filling the prednisone and starting it if she feels like her symptoms are worsening or progressing.  Make sure to use albuterol as needed.

## 2021-05-01 NOTE — Progress Notes (Signed)
Patient will schedule appointment for Colonoscopy.

## 2021-05-01 NOTE — Progress Notes (Signed)
Acute Office Visit  Subjective:    Patient ID: Victoria Benjamin, female    DOB: [birth year], 75 y.o.   MRN: [id number]  Chief Complaint  Patient presents with   Nasal Congestion    Sore throat, slight productive cough for 3 days, Covid test negative on 04/30/21    HPI Patient is in today for cough for about 3 days.  She said it started Monday and has been waking her up at night.  It is mostly dry but occasionally gets some phlegm up.  No shortness of breath or wheezing.  She does have underlying COPD.  She does use her Spiriva regularly.  She has also had some nasal congestion and lots of postnasal drip.  No fevers chills or sweats.  No ear pain but she has had a mild sore throat.  Past Medical History:  Diagnosis Date   COPD (chronic obstructive pulmonary disease) ([facility])     Past Surgical History:  Procedure Laterality Date   ABDOMINAL HYSTERECTOMY     partial   BACK SURGERY     BREAST ENHANCEMENT SURGERY     BREAST EXCISIONAL BIOPSY Right    BREAST SURGERY     CAPSULOTOMY Right 08/01/2019   CATARACT EXTRACTION Right    HAND SURGERY  2006, 07   NECK SURGERY  2002    Family History  Problem Relation Age of Onset   Hypertension Mother    Arthritis Mother        osteo   Pulmonary embolism Mother    Heart failure Father    Hyperlipidemia Father    Alcoholism Father     Social History   Socioeconomic History   Marital status: Legally Separated    Spouse name: Not on file   Number of children: 2   Years of education: 12   Highest education level: 12th grade  Occupational History    Comment: Retired  Tobacco Use   Smoking status: Former   Smokeless tobacco: Never  Substance and Sexual Activity   Alcohol use: Yes    Alcohol/week: 7.0 standard drinks    Types: 7 Glasses of wine per week   Drug use: No   Sexual activity: Not Currently  Other Topics Concern   Not on file  Social History Narrative   Lives alone. She has two children, one does live close by.  She enjoys doing yard work in her free time.   Social Determinants of Health   Financial Resource Strain: Low Risk    Difficulty of Paying Living Expenses: Not hard at all  Food Insecurity: No Food Insecurity   Worried About Charity fundraiser in the Last Year: Never true   [place] in the Last Year: Never true  Transportation Needs: No Transportation Needs   Lack of Transportation (Medical): No   Lack of Transportation (Non-Medical): No  Physical Activity: Sufficiently Active   Days of Exercise per Week: 7 days   Minutes of Exercise per Session: 40 min  Stress: No Stress Concern Present   Feeling of Stress : Not at all  Social Connections: Moderately Isolated   Frequency of Communication with Friends and Family: Three times a week   Frequency of Social Gatherings with Friends and Family: More than three times a week   Attends Religious Services: More than 4 times per year   Active Member of [employer] or Organizations: No   Attends Archivist Meetings: Never   Marital Status: Separated  Intimate Partner  Violence: Not At Risk   Fear of Current or Ex-Partner: No   Emotionally Abused: No   Physically Abused: No   Sexually Abused: No    Outpatient Medications Prior to Visit  Medication Sig Dispense Refill   calcium carbonate (OSCAL) 1500 (600 Ca) MG TABS tablet Take 600 mg of elemental calcium by mouth daily with breakfast.     omeprazole (PRILOSEC) 20 MG capsule TAKE 1 CAPSULE(20 MG) BY MOUTH DAILY 90 capsule 1   rosuvastatin (CRESTOR) 10 MG tablet TAKE 1 TABLET BY MOUTH EVERY DAY 90 tablet 3   tiotropium (SPIRIVA HANDIHALER) 18 MCG inhalation capsule Place 1 capsule (18 mcg total) into inhaler and inhale daily. 30 capsule 12   ondansetron (ZOFRAN) 4 MG tablet Take 1-2 tablets (4-8 mg total) by mouth every 8 (eight) hours as needed for nausea or vomiting. As needed nausea 12 tablet 0   tiZANidine (ZANAFLEX) 4 MG tablet Take 1-2 tablets (4-8 mg total) by mouth every 6  (six) hours as needed for muscle spasms. 21 tablet 0   HYDROcodone-acetaminophen (NORCO/VICODIN) 5-325 MG tablet Take 1-2 tablets by mouth every 6 (six) hours as needed. 10 tablet 0   No facility-administered medications prior to visit.    Allergies  Allergen Reactions   Lipitor [Atorvastatin] Other (See Comments)    Myalgias, lightheadedness, headaches, cramping    Review of Systems     Objective:    Physical Exam Constitutional:      Appearance: She is well-developed.  HENT:     Head: Normocephalic and atraumatic.     Right Ear: External ear normal.     Left Ear: External ear normal.     Nose: Nose normal.  Eyes:     Conjunctiva/sclera: Conjunctivae normal.     Pupils: Pupils are equal, round, and reactive to light.  Neck:     Thyroid: No thyromegaly.  Cardiovascular:     Rate and Rhythm: Normal rate and regular rhythm.     Heart sounds: Normal heart sounds.  Pulmonary:     Effort: Pulmonary effort is normal.     Breath sounds: Normal breath sounds. No wheezing.  Musculoskeletal:     Cervical back: Neck supple.  Lymphadenopathy:     Cervical: No cervical adenopathy.  Skin:    General: Skin is warm and dry.  Neurological:     Mental Status: She is alert and oriented to person, place, and time.    BP (!) 145/70    Pulse 81    Temp 98.2 F (36.8 C)    Resp 16    Ht 5\' 2"  (1.575 m)    Wt 150 lb (68 kg)    SpO2 98%    BMI 27.44 kg/m  Wt Readings from Last 3 Encounters:  05/01/21 150 lb (68 kg)  12/10/20 146 lb (66.2 kg)  11/15/20 146 lb (66.2 kg)    Health Maintenance Due  Topic Date Due   COVID-19 Vaccine (4 - Booster for Pfizer series) 04/25/2020   Zoster Vaccines- Shingrix (2 of 2) 04/25/2020   COLONOSCOPY (Pts 45-37yrs Insurance coverage will need to be confirmed)  01/16/2021    There are no preventive care reminders to display for this patient.   Lab Results  Component Value Date   TSH 1.505 10/25/2008   Lab Results  Component Value Date   WBC  5.3 01/24/2019   HGB 14.1 01/24/2019   HCT 41.7 01/24/2019   MCV 93.7 01/24/2019   PLT 209 01/24/2019   Lab  Results  Component Value Date   NA 140 02/07/2020   K 4.2 02/07/2020   CO2 25 02/07/2020   GLUCOSE 94 02/07/2020   BUN 11 02/07/2020   CREATININE 0.94 (H) 02/07/2020   BILITOT 0.7 02/07/2020   ALKPHOS 99 11/19/2015   AST 23 02/07/2020   ALT 18 02/07/2020   PROT 6.8 02/07/2020   ALBUMIN 4.4 11/19/2015   CALCIUM 10.1 02/07/2020   Lab Results  Component Value Date   CHOL 186 02/07/2020   Lab Results  Component Value Date   HDL 50 02/07/2020   Lab Results  Component Value Date   LDLCALC 109 (H) 02/07/2020   Lab Results  Component Value Date   TRIG 155 (H) 02/07/2020   Lab Results  Component Value Date   CHOLHDL 3.7 02/07/2020   Lab Results  Component Value Date   HGBA1C 5.8 (A) 08/16/2020       Assessment & Plan:   Problem List Items Addressed This Visit       Respiratory   Chronic obstructive pulmonary disease ([place])    Right and treat with azithromycin.  She is not having a lot of shortness of breath or increased wheezing.  This Victoria Benjamin has some wheezing at baseline.  But we did discuss filling the prednisone and starting it if she feels like her symptoms are worsening or progressing.  Make sure to use albuterol as needed.      Relevant Medications   azithromycin (ZITHROMAX) 250 MG tablet   predniSONE (DELTASONE) 20 MG tablet   albuterol (VENTOLIN HFA) 108 (90 Base) MCG/ACT inhaler   Other Visit Diagnoses     Acute cough    -  Primary   Lower resp. tract infection       Relevant Medications   azithromycin (ZITHROMAX) 250 MG tablet        Meds ordered this encounter  Medications   azithromycin (ZITHROMAX) 250 MG tablet    Sig: 2 Ttabs PO on Day 1, then one a day x 4 days.    Dispense:  6 tablet    Refill:  0   predniSONE (DELTASONE) 20 MG tablet    Sig: Take 2 tablets (40 mg total) by mouth daily with breakfast.    Dispense:  10 tablet     Refill:  0   albuterol (VENTOLIN HFA) 108 (90 Base) MCG/ACT inhaler    Sig: Inhale 2 puffs into the lungs every 6 (six) hours as needed for wheezing or shortness of breath. Or wheezing    Dispense:  8 g    Refill:  0     [name], MD

## 2021-05-07 ENCOUNTER — Telehealth: Payer: Self-pay | Admitting: *Deleted

## 2021-05-07 ENCOUNTER — Ambulatory Visit (INDEPENDENT_AMBULATORY_CARE_PROVIDER_SITE_OTHER): Payer: Medicare Other

## 2021-05-07 DIAGNOSIS — R051 Acute cough: Secondary | ICD-10-CM

## 2021-05-07 DIAGNOSIS — J449 Chronic obstructive pulmonary disease, unspecified: Secondary | ICD-10-CM

## 2021-05-07 DIAGNOSIS — J439 Emphysema, unspecified: Secondary | ICD-10-CM | POA: Diagnosis not present

## 2021-05-07 NOTE — Telephone Encounter (Signed)
Pt called and stated that she has been coughing really bad. The cough wakes her up in the middle of the night and she continues to cough all throughout the day.   Pharmacy confirmed. Allergies verified.

## 2021-05-07 NOTE — Telephone Encounter (Signed)
Pt advised to get CXR done. She will come by today for this

## 2021-05-07 NOTE — Progress Notes (Signed)
HI Shailah, the chest x-ray shows no pneumonia just some chronic lung changes and emphysema.  So that is reassuring he would probably benefit from another round of prednisone is that something that she would be okay with?  In addition to maximizing your inhaler use and doing 2 puffs 3 times a day.

## 2021-05-07 NOTE — Telephone Encounter (Signed)
Go for CXR since didn't improve with steroid and ABX

## 2021-05-08 ENCOUNTER — Ambulatory Visit
Admission: RE | Admit: 2021-05-08 | Discharge: 2021-05-08 | Disposition: A | Discharge status: Home / Self Care | Payer: Medicare Other | Source: Ambulatory Visit | Attending: Family Medicine | Admitting: Family Medicine

## 2021-05-08 DIAGNOSIS — N6489 Other specified disorders of breast: Secondary | ICD-10-CM

## 2021-05-08 DIAGNOSIS — N641 Fat necrosis of breast: Secondary | ICD-10-CM | POA: Diagnosis not present

## 2021-05-08 DIAGNOSIS — R928 Other abnormal and inconclusive findings on diagnostic imaging of breast: Secondary | ICD-10-CM | POA: Diagnosis not present

## 2021-05-14 ENCOUNTER — Telehealth (INDEPENDENT_AMBULATORY_CARE_PROVIDER_SITE_OTHER): Payer: Medicare Other | Admitting: Family Medicine

## 2021-05-14 ENCOUNTER — Encounter: Payer: Self-pay | Admitting: Family Medicine

## 2021-05-14 DIAGNOSIS — U071 COVID-19: Secondary | ICD-10-CM

## 2021-05-14 MED ORDER — AZELASTINE HCL 0.1 % NA SOLN: 2.0000 | Freq: Two times a day (BID) | NASAL | 12 refills | Status: DC

## 2021-05-14 MED ORDER — NIRMATRELVIR/RITONAVIR (PAXLOVID) TABLET (RENAL DOSING): 2.0000 | ORAL_TABLET | Freq: Two times a day (BID) | ORAL | 0 refills | Status: AC

## 2021-05-14 NOTE — Progress Notes (Signed)
Pt reports that she did get better but over the weekend her sxs got worse. She stated that she took a covid it was positive.   Sinus pain pressure, teeth hurt, scalp hurts, fatigue, unable to taste.   Some chills, no sweats. No n/v/d. Mucus is mostly clear some tinge of green.   Pt thought that Dr. Madilyn Fireman was going to send over another prescription for steroids.

## 2021-05-14 NOTE — Progress Notes (Signed)
[name], MD

## 2021-05-14 NOTE — Progress Notes (Signed)
Acute Office Visit  Subjective:    Patient ID: Victoria Benjamin, female    DOB: [birth year], 76 y.o.   MRN: [id number]  Chief Complaint  Patient presents with   Sinusitis     HPI Patient is in today for sinus sxs since 11/30. Had a very sore throat.   Felt very weak she could hardly move and  felt extremely tired.  Not sure if had a fever.  No GI sxs.  Chest feels OK. Feels like her skin is crawling.  Drainage in her sinuses and gums hurts. Cough is severe.  Using a cough medicine at bedtime. She tested positive for COVID-19.  She had just been treated for COPD exacerbation on December 21 and completed her antibiotics on the 25th and says she was feeling much better by the 25th.    Past Medical History:  Diagnosis Date   COPD (chronic obstructive pulmonary disease) (HCC)     Past Surgical History:  Procedure Laterality Date   ABDOMINAL HYSTERECTOMY     partial   BACK SURGERY     BREAST ENHANCEMENT SURGERY     BREAST EXCISIONAL BIOPSY Right    BREAST SURGERY     CAPSULOTOMY Right 08/01/2019   CATARACT EXTRACTION Right    HAND SURGERY  2006, 07   NECK SURGERY  2002    Family History  Problem Relation Age of Onset   Hypertension Mother    Arthritis Mother        osteo   Pulmonary embolism Mother    Heart failure Father    Hyperlipidemia Father    Alcoholism Father     Social History   Socioeconomic History   Marital status: Legally Separated    Spouse name: Not on file   Number of children: 2   Years of education: 12   Highest education level: 12th grade  Occupational History    Comment: Retired  Tobacco Use   Smoking status: Former   Smokeless tobacco: Never  Substance and Sexual Activity   Alcohol use: Yes    Alcohol/week: 7.0 standard drinks    Types: 7 Glasses of wine per week   Drug use: No   Sexual activity: Not Currently  Other Topics Concern   Not on file  Social History Narrative   Lives alone. She has two children, one does live close by.  She enjoys doing yard work in her free time.   Social Determinants of Health   Financial Resource Strain: Low Risk    Difficulty of Paying Living Expenses: Not hard at all  Food Insecurity: No Food Insecurity   Worried About Charity fundraiser in the Last Year: Never true   [place] in the Last Year: Never true  Transportation Needs: No Transportation Needs   Lack of Transportation (Medical): No   Lack of Transportation (Non-Medical): No  Physical Activity: Sufficiently Active   Days of Exercise per Week: 7 days   Minutes of Exercise per Session: 40 min  Stress: No Stress Concern Present   Feeling of Stress : Not at all  Social Connections: Moderately Isolated   Frequency of Communication with Friends and Family: Three times a week   Frequency of Social Gatherings with Friends and Family: More than three times a week   Attends Religious Services: More than 4 times per year   Active Member of [employer] or Organizations: No   Attends Archivist Meetings: Never   Marital Status: Separated  Intimate  Partner Violence: Not At Risk   Fear of Current or Ex-Partner: No   Emotionally Abused: No   Physically Abused: No   Sexually Abused: No    Outpatient Medications Prior to Visit  Medication Sig Dispense Refill   albuterol (VENTOLIN HFA) 108 (90 Base) MCG/ACT inhaler Inhale 2 puffs into the lungs every 6 (six) hours as needed for wheezing or shortness of breath. Or wheezing 8 g 0   calcium carbonate (OSCAL) 1500 (600 Ca) MG TABS tablet Take 600 mg of elemental calcium by mouth daily with breakfast.     omeprazole (PRILOSEC) 20 MG capsule TAKE 1 CAPSULE(20 MG) BY MOUTH DAILY 90 capsule 1   rosuvastatin (CRESTOR) 10 MG tablet TAKE 1 TABLET BY MOUTH EVERY DAY 90 tablet 3   tiotropium (SPIRIVA HANDIHALER) 18 MCG inhalation capsule Place 1 capsule (18 mcg total) into inhaler and inhale daily. 30 capsule 12   predniSONE (DELTASONE) 20 MG tablet Take 2 tablets (40 mg total) by  mouth daily with breakfast. 10 tablet 0   No facility-administered medications prior to visit.    Allergies  Allergen Reactions   Hydrocodone Other (See Comments)    Crawling senstaion on skin   Lipitor [Atorvastatin] Other (See Comments)    Myalgias, lightheadedness, headaches, cramping    Review of Systems     Objective:    Physical Exam  There were no vitals taken for this visit. Wt Readings from Last 3 Encounters:  05/01/21 150 lb (68 kg)  12/10/20 146 lb (66.2 kg)  11/15/20 146 lb (66.2 kg)    Health Maintenance Due  Topic Date Due   COVID-19 Vaccine (4 - Booster for Pfizer series) 04/25/2020   Zoster Vaccines- Shingrix (2 of 2) 04/25/2020   COLONOSCOPY (Pts 45-32yrs Insurance coverage will need to be confirmed)  01/16/2021    There are no preventive care reminders to display for this patient.   Lab Results  Component Value Date   TSH 1.505 10/25/2008   Lab Results  Component Value Date   WBC 5.3 01/24/2019   HGB 14.1 01/24/2019   HCT 41.7 01/24/2019   MCV 93.7 01/24/2019   PLT 209 01/24/2019   Lab Results  Component Value Date   NA 140 02/07/2020   K 4.2 02/07/2020   CO2 25 02/07/2020   GLUCOSE 94 02/07/2020   BUN 11 02/07/2020   CREATININE 0.94 (H) 02/07/2020   BILITOT 0.7 02/07/2020   ALKPHOS 99 11/19/2015   AST 23 02/07/2020   ALT 18 02/07/2020   PROT 6.8 02/07/2020   ALBUMIN 4.4 11/19/2015   CALCIUM 10.1 02/07/2020   Lab Results  Component Value Date   CHOL 186 02/07/2020   Lab Results  Component Value Date   HDL 50 02/07/2020   Lab Results  Component Value Date   LDLCALC 109 (H) 02/07/2020   Lab Results  Component Value Date   TRIG 155 (H) 02/07/2020   Lab Results  Component Value Date   CHOLHDL 3.7 02/07/2020   Lab Results  Component Value Date   HGBA1C 5.8 (A) 08/16/2020       Assessment & Plan:   Problem List Items Addressed This Visit   None Visit Diagnoses     COVID-19    -  Primary   Relevant  Medications   nirmatrelvir/ritonavir EUA, renal dosing, (PAXLOVID) 10 x 150 MG & 10 x 100MG  TABS      COVID-19-discussed potential treatment options.  She would like to do the Paxil bid.  She is moderate risk for complications and she just got better after recent COPD exacerbation so really want to try to hold off on putting her on another round of prednisone and antibiotics if possible.  Continue with over-the-counter cough medicine since it does seem to help.  Make sure you are staying well-hydrated.  She would like something for the drainage so sent over prescription for Astelin as well.  Use inhalers as needed.  Meds ordered this encounter  Medications   azelastine (ASTELIN) 0.1 % nasal spray    Sig: Place 2 sprays into both nostrils 2 (two) times daily. Use in each nostril as directed    Dispense:  30 mL    Refill:  12   nirmatrelvir/ritonavir EUA, renal dosing, (PAXLOVID) 10 x 150 MG & 10 x 100MG  TABS    Sig: Take 2 tablets by mouth 2 (two) times daily for 5 days. (Take nirmatrelvir 150 mg one tablet twice daily for 5 days and ritonavir 100 mg one tablet twice daily for 5 days) Patient GFR is 60    Dispense:  20 tablet    Refill:  0     [name], MD

## 2021-06-26 ENCOUNTER — Other Ambulatory Visit: Payer: Self-pay | Admitting: Family Medicine

## 2021-06-27 MED ORDER — ROSUVASTATIN CALCIUM 10 MG PO TABS: 10.0000 mg | ORAL_TABLET | Freq: Every day | ORAL | 0 refills | Status: DC

## 2021-06-27 NOTE — Addendum Note (Signed)
Addended byAnnamaria Helling on: 06/27/2021 05:31 PM   Modules accepted: Orders

## 2021-07-18 DIAGNOSIS — Z85828 Personal history of other malignant neoplasm of skin: Secondary | ICD-10-CM | POA: Diagnosis not present

## 2021-07-18 DIAGNOSIS — Z129 Encounter for screening for malignant neoplasm, site unspecified: Secondary | ICD-10-CM | POA: Diagnosis not present

## 2021-07-18 DIAGNOSIS — L820 Inflamed seborrheic keratosis: Secondary | ICD-10-CM | POA: Diagnosis not present

## 2021-07-18 DIAGNOSIS — L570 Actinic keratosis: Secondary | ICD-10-CM | POA: Diagnosis not present

## 2021-07-18 DIAGNOSIS — L821 Other seborrheic keratosis: Secondary | ICD-10-CM | POA: Diagnosis not present

## 2021-07-26 ENCOUNTER — Other Ambulatory Visit: Payer: Self-pay | Admitting: Family Medicine

## 2021-08-25 ENCOUNTER — Other Ambulatory Visit: Payer: Self-pay | Admitting: Family Medicine

## 2021-08-26 NOTE — Telephone Encounter (Signed)
Please call pt and advise her that she will need an OV and  fasting labs for refills on her cholesterol medication. thanks ?

## 2021-08-26 NOTE — Telephone Encounter (Signed)
Patient has been scheduled for f/u with Dr Madilyn Fireman for in the morning. AMUCK ?

## 2021-08-27 ENCOUNTER — Encounter: Payer: Self-pay | Admitting: Family Medicine

## 2021-08-27 ENCOUNTER — Ambulatory Visit (INDEPENDENT_AMBULATORY_CARE_PROVIDER_SITE_OTHER): Payer: Medicare Other | Admitting: Family Medicine

## 2021-08-27 VITALS — BP 135/69 | HR 72 | Resp 16 | Ht 62.0 in | Wt 150.0 lb

## 2021-08-27 DIAGNOSIS — R7989 Other specified abnormal findings of blood chemistry: Secondary | ICD-10-CM

## 2021-08-27 DIAGNOSIS — K2100 Gastro-esophageal reflux disease with esophagitis, without bleeding: Secondary | ICD-10-CM | POA: Diagnosis not present

## 2021-08-27 DIAGNOSIS — Z1211 Encounter for screening for malignant neoplasm of colon: Secondary | ICD-10-CM | POA: Diagnosis not present

## 2021-08-27 DIAGNOSIS — E785 Hyperlipidemia, unspecified: Secondary | ICD-10-CM | POA: Diagnosis not present

## 2021-08-27 DIAGNOSIS — J449 Chronic obstructive pulmonary disease, unspecified: Secondary | ICD-10-CM | POA: Diagnosis not present

## 2021-08-27 DIAGNOSIS — R7301 Impaired fasting glucose: Secondary | ICD-10-CM | POA: Diagnosis not present

## 2021-08-27 LAB — POCT GLYCOSYLATED HEMOGLOBIN (HGB A1C): Hemoglobin A1C: 5.8 % — AB (ref 4.0–5.6)

## 2021-08-27 MED ORDER — SPIRIVA HANDIHALER 18 MCG IN CAPS: 18.0000 ug | ORAL_CAPSULE | Freq: Every day | RESPIRATORY_TRACT | 3 refills | Status: DC

## 2021-08-27 MED ORDER — ALBUTEROL SULFATE HFA 108 (90 BASE) MCG/ACT IN AERS: 2.0000 | INHALATION_SPRAY | Freq: Four times a day (QID) | RESPIRATORY_TRACT | 1 refills | Status: AC | PRN

## 2021-08-27 MED ORDER — ROSUVASTATIN CALCIUM 10 MG PO TABS: 10.0000 mg | ORAL_TABLET | Freq: Every day | ORAL | 3 refills | Status: DC

## 2021-08-27 MED ORDER — OMEPRAZOLE 40 MG PO CPDR: 40.0000 mg | DELAYED_RELEASE_CAPSULE | Freq: Every day | ORAL | 2 refills | Status: DC

## 2021-08-27 NOTE — Assessment & Plan Note (Signed)
Been well on her Spiriva.  No problems or concerns currently. ?

## 2021-08-27 NOTE — Assessment & Plan Note (Signed)
Due to recheck lipid panel.  Currently on Crestor 10 mg. ?

## 2021-08-27 NOTE — Assessment & Plan Note (Signed)
She is noticed that spicy foods really trigger her heartburn more.  Even pepper.  She would really like to be able to go up on her omeprazole.  New prescription sent for 40 mg. ?

## 2021-08-27 NOTE — Assessment & Plan Note (Signed)
A1c looks great today at 5.8 which is stable from last year. ?

## 2021-08-27 NOTE — Progress Notes (Signed)
? ?Established Patient Office Visit ? ?Subjective   ?Patient ID: Victoria Benjamin, female    DOB: [birth year]  Age: 76 y.o. MRN: [id number] ? ?Chief Complaint  ?Patient presents with  ? Imapired Fasting Glucose  ?  Follow up   ? ? ?HPI ?Impaired fasting glucose-no increased thirst or urination. No symptoms consistent with hypoglycemia. ? ?COPD-stable no recent flares.  Currently on Spiriva and doing well with it. ? ?Hyperlipidemia - tolerating stating well with no myalgias or significant side effects.  ?Lab Results  ?Component Value Date  ? CHOL 186 02/07/2020  ? HDL 50 02/07/2020  ? LDLCALC 109 (H) 02/07/2020  ? TRIG 155 (H) 02/07/2020  ? CHOLHDL 3.7 02/07/2020  ? ?Needs up-to-date colon cancer screening. ? ? ? ? ?ROS ? ?  ?Objective:  ?  ? ?BP 135/69   Pulse 72   Resp 16   Ht '5\' 2"'$  (1.575 m)   Wt 150 lb (68 kg)   SpO2 95%   BMI 27.44 kg/m?  ? ? ?Physical Exam ?Vitals and nursing note reviewed.  ?Constitutional:   ?   Appearance: She is well-developed.  ?HENT:  ?   Head: Normocephalic and atraumatic.  ?Neck:  ?   Comments: No TM.  ?Cardiovascular:  ?   Rate and Rhythm: Normal rate and regular rhythm.  ?   Heart sounds: Normal heart sounds.  ?Pulmonary:  ?   Effort: Pulmonary effort is normal.  ?   Breath sounds: Normal breath sounds.  ?Musculoskeletal:  ?   Cervical back: Neck supple. No tenderness.  ?Skin: ?   General: Skin is warm and dry.  ?Neurological:  ?   Mental Status: She is alert and oriented to person, place, and time.  ?Psychiatric:     ?   Behavior: Behavior normal.  ? ? ? ?Results for orders placed or performed in visit on 08/27/21  ?POCT HgB A1C  ?Result Value Ref Range  ? Hemoglobin A1C 5.8 (A) 4.0 - 5.6 %  ? HbA1c POC (<> result, manual entry)    ? HbA1c, POC (prediabetic range)    ? HbA1c, POC (controlled diabetic range)    ? ? ? ? ?The 10-year ASCVD risk score (Arnett DK, et al., 2019) is: 17.5% ? ?  ?Assessment & Plan:  ? ?Problem List Items Addressed This Visit   ? ?  ? Respiratory  ?  Chronic obstructive pulmonary disease (HCC)  ?  Been well on her Spiriva.  No problems or concerns currently. ? ?  ?  ? Relevant Medications  ? tiotropium (SPIRIVA HANDIHALER) 18 MCG inhalation capsule  ? albuterol (VENTOLIN HFA) 108 (90 Base) MCG/ACT inhaler  ? Other Relevant Orders  ? COMPLETE METABOLIC PANEL WITH GFR  ?  ? Digestive  ? Gastroesophageal reflux disease with esophagitis without hemorrhage  ?  She is noticed that spicy foods really trigger her heartburn more.  Even pepper.  She would really like to be able to go up on her omeprazole.  New prescription sent for 40 mg. ? ?  ?  ? Relevant Medications  ? omeprazole (PRILOSEC) 40 MG capsule  ?  ? Endocrine  ? IFG (impaired fasting glucose) - Primary  ?  A1c looks great today at 5.8 which is stable from last year. ? ?  ?  ? Relevant Orders  ? POCT HgB A1C (Completed)  ? CBC  ? COMPLETE METABOLIC PANEL WITH GFR  ?  ? Other  ? Hyperlipidemia  ?  Due  to recheck lipid panel.  Currently on Crestor 10 mg. ? ?  ?  ? Relevant Medications  ? rosuvastatin (CRESTOR) 10 MG tablet  ? Other Relevant Orders  ? CBC  ? Lipid panel  ? COMPLETE METABOLIC PANEL WITH GFR  ? Elevated serum creatinine  ? Relevant Orders  ? CBC  ? COMPLETE METABOLIC PANEL WITH GFR  ? ?Other Visit Diagnoses   ? ? Colon cancer screening      ? Relevant Orders  ? Cologuard  ? COMPLETE METABOLIC PANEL WITH GFR  ? ?  ? ? ?Return in about 6 months (around 02/26/2022) for COPD.  ? ? ?[name], MD ? ?

## 2021-08-28 LAB — COMPLETE METABOLIC PANEL WITH GFR
AG Ratio: 1.7 (calc) (ref 1.0–2.5)
ALT: 22 U/L (ref 6–29)
AST: 25 U/L (ref 10–35)
Albumin: 4.5 g/dL (ref 3.6–5.1)
Alkaline phosphatase (APISO): 73 U/L (ref 37–153)
BUN: 12 mg/dL (ref 7–25)
CO2: 30 mmol/L (ref 20–32)
Calcium: 9.7 mg/dL (ref 8.6–10.4)
Chloride: 104 mmol/L (ref 98–110)
Creat: 0.79 mg/dL (ref 0.60–1.00)
Globulin: 2.6 g/dL (calc) (ref 1.9–3.7)
Glucose, Bld: 97 mg/dL (ref 65–99)
Potassium: 4.1 mmol/L (ref 3.5–5.3)
Sodium: 142 mmol/L (ref 135–146)
Total Bilirubin: 0.6 mg/dL (ref 0.2–1.2)
Total Protein: 7.1 g/dL (ref 6.1–8.1)
eGFR: 78 mL/min/{1.73_m2} (ref >=60)

## 2021-08-28 LAB — CBC
HCT: 41.1 % (ref 35.0–45.0)
Hemoglobin: 13.5 g/dL (ref 11.7–15.5)
MCH: 30.7 pg (ref 27.0–33.0)
MCHC: 32.8 g/dL (ref 32.0–36.0)
MCV: 93.4 fL (ref 80.0–100.0)
MPV: 10.2 fL (ref 7.5–12.5)
Platelets: 216 10*3/uL (ref 140–400)
RBC: 4.4 10*6/uL (ref 3.80–5.10)
RDW: 12.1 % (ref 11.0–15.0)
WBC: 5.8 10*3/uL (ref 3.8–10.8)

## 2021-08-28 LAB — LIPID PANEL
Cholesterol: 161 mg/dL (ref <200)
HDL: 52 mg/dL (ref >=50)
LDL Cholesterol (Calc): 85 mg/dL (calc)
Non-HDL Cholesterol (Calc): 109 mg/dL (calc) (ref <130)
Total CHOL/HDL Ratio: 3.1 (calc) (ref <5.0)
Triglycerides: 138 mg/dL (ref <150)

## 2021-08-28 NOTE — Progress Notes (Signed)
Your lab work is within acceptable range and there are no concerning findings.   ?

## 2021-09-03 DIAGNOSIS — Z1211 Encounter for screening for malignant neoplasm of colon: Secondary | ICD-10-CM | POA: Diagnosis not present

## 2021-09-12 LAB — COLOGUARD: COLOGUARD: NEGATIVE

## 2021-09-12 NOTE — Progress Notes (Signed)
Great news! Your Cologuard test is negative.  Recommend repeat colon cancer screening in 3 years.

## 2021-11-11 ENCOUNTER — Ambulatory Visit (INDEPENDENT_AMBULATORY_CARE_PROVIDER_SITE_OTHER): Payer: Medicare Other | Admitting: Family Medicine

## 2021-11-11 DIAGNOSIS — Z0000 Encounter for general adult medical examination without abnormal findings: Secondary | ICD-10-CM | POA: Diagnosis not present

## 2021-11-11 NOTE — Progress Notes (Signed)
MEDICARE ANNUAL WELLNESS VISIT  11/11/2021  Telephone Visit Disclaimer This Medicare AWV was conducted by telephone due to national recommendations for restrictions regarding the COVID-19 Pandemic (e.g. social distancing).  I verified, using two identifiers, that I am speaking with Victoria Benjamin or their authorized healthcare agent. I discussed the limitations, risks, security, and privacy concerns of performing an evaluation and management service by telephone and the potential availability of an in-person appointment in the future. The patient expressed understanding and agreed to proceed.  Location of Patient: Home Location of Provider (nurse):  In the office.  Subjective:    Victoria Benjamin is a 76 y.o. female patient of Metheney, Rene Kocher, MD who had a Medicare Annual Wellness Visit today via telephone. Victoria Benjamin is Retired and lives alone. Victoria Benjamin has 2 children. Victoria Benjamin reports that Victoria Benjamin is socially active and does interact with friends/family regularly. Victoria Benjamin is moderately physically active and enjoys doing yard work.  Patient Care Team: Hali Marry, MD as PCP - General     11/11/2021   11:02 AM 10/29/2020    9:06 AM 11/29/2014    8:52 AM 05/25/2014    1:09 PM  Advanced Directives  Does Patient Have a Medical Advance Directive? Yes Yes Yes Yes  Type of Advance Directive Living will [place];Living will  [place];Living will  Does patient want to make changes to medical advance directive? No - Patient declined   No - Patient declined  Copy of [place] in Chart?  No - copy requested No - copy requested No - copy requested    Hospital Utilization Over the Past 12 Months: # of hospitalizations or ER visits: 0 # of surgeries: 0  Review of Systems    Patient reports that her overall health is unchanged compared to last year.  History obtained from chart review and the patient  Patient Reported Readings (BP,  Pulse, CBG, Weight, etc) none  Pain Assessment Pain : No/denies pain     Current Medications & Allergies (verified) Allergies as of 11/11/2021       Reactions   Hydrocodone Other (See Comments)   Crawling senstaion on skin   Lipitor [atorvastatin] Other (See Comments)   Myalgias, lightheadedness, headaches, cramping        Medication List        Accurate as of November 11, 2021 11:12 AM. If you have any questions, ask your nurse or doctor.          albuterol 108 (90 Base) MCG/ACT inhaler Commonly known as: VENTOLIN HFA Inhale 2 puffs into the lungs every 6 (six) hours as needed for wheezing or shortness of breath.   calcium carbonate 1500 (600 Ca) MG Tabs tablet Commonly known as: OSCAL Take 600 mg of elemental calcium by mouth daily with breakfast.   omeprazole 40 MG capsule Commonly known as: PRILOSEC Take 1 capsule (40 mg total) by mouth daily.   rosuvastatin 10 MG tablet Commonly known as: CRESTOR Take 1 tablet (10 mg total) by mouth daily. 90 day supply given. Please call office to schedule an appointment and have fasting labs done.   Spiriva HandiHaler 18 MCG inhalation capsule Generic drug: tiotropium Place 1 capsule (18 mcg total) into inhaler and inhale daily.        History (reviewed): Past Medical History:  Diagnosis Date   COPD (chronic obstructive pulmonary disease) ([place])    Past Surgical History:  Procedure Laterality Date   ABDOMINAL HYSTERECTOMY  partial   BACK SURGERY     BREAST ENHANCEMENT SURGERY     BREAST EXCISIONAL BIOPSY Right    BREAST SURGERY     CAPSULOTOMY Right 08/01/2019   CATARACT EXTRACTION Right    HAND SURGERY  2006, 07   NECK SURGERY  2002   Family History  Problem Relation Age of Onset   Hypertension Mother    Arthritis Mother        osteo   Pulmonary embolism Mother    Heart failure Father    Hyperlipidemia Father    Alcoholism Father    Social History   Socioeconomic History   Marital status: Legally  Separated    Spouse name: Not on file   Number of children: 2   Years of education: 12   Highest education level: 12th grade  Occupational History    Comment: Retired  Tobacco Use   Smoking status: Former   Smokeless tobacco: Never  Substance and Sexual Activity   Alcohol use: Yes    Alcohol/week: 5.0 standard drinks of alcohol    Types: 5 Glasses of wine per week   Drug use: No   Sexual activity: Not Currently  Other Topics Concern   Not on file  Social History Narrative   Lives alone. Victoria Benjamin has two children, one does live close by. Victoria Benjamin enjoys doing yard work in her free time.   Social Determinants of Health   Financial Resource Strain: Low Risk  (11/11/2021)   Overall Financial Resource Strain (CARDIA)    Difficulty of Paying Living Expenses: Not hard at all  Food Insecurity: No Food Insecurity (11/11/2021)   Hunger Vital Sign    Worried About Running Out of Food in the Last Year: Never true    Ran Out of Food in the Last Year: Never true  Transportation Needs: No Transportation Needs (11/11/2021)   PRAPARE - Hydrologist (Medical): No    Lack of Transportation (Non-Medical): No  Physical Activity: Sufficiently Active (11/11/2021)   Exercise Vital Sign    Days of Exercise per Week: 6 days    Minutes of Exercise per Session: 30 min  Stress: No Stress Concern Present (11/11/2021)   [place]    Feeling of Stress : Not at all  Social Connections: Moderately Isolated (11/11/2021)   Social Connection and Isolation Panel [NHANES]    Frequency of Communication with Friends and Family: Three times a week    Frequency of Social Gatherings with Friends and Family: Twice a week    Attends Religious Services: More than 4 times per year    Active Member of [employer] or Organizations: No    Attends Archivist Meetings: Never    Marital Status: Separated    Activities of Daily Living     11/11/2021   11:06 AM  In your present state of health, do you have any difficulty performing the following activities:  Hearing? 0  Vision? 0  Difficulty concentrating or making decisions? 0  Walking or climbing stairs? 0  Dressing or bathing? 0  Doing errands, shopping? 0  Preparing Food and eating ? N  Using the Toilet? N  In the past six months, have you accidently leaked urine? Y  Comment stress incontinence.  Do you have problems with loss of bowel control? N  Managing your Medications? N  Managing your Finances? N  Housekeeping or managing your Housekeeping? N    Patient Education/  Literacy How often do you need to have someone help you when you read instructions, pamphlets, or other written materials from your doctor or pharmacy?: 1 - Never What is the last grade level you completed in school?: GED  Exercise Current Exercise Habits: Home exercise routine, Type of exercise: walking, Time (Minutes): 30, Frequency (Times/Week): 6, Weekly Exercise (Minutes/Week): 180, Intensity: Moderate, Exercise limited by: None identified  Diet Patient reports consuming 2 meals a day and 1 snack(s) a day Patient reports that her primary diet is: Regular Patient reports that Victoria Benjamin does have regular access to food.   Depression Screen    11/11/2021   11:03 AM 08/27/2021    9:09 AM 10/29/2020    9:07 AM 08/16/2020   10:59 AM 02/07/2020   11:09 AM 01/21/2018    8:55 AM 07/23/2017    8:48 AM  PHQ 2/9 Scores  PHQ - 2 Score 0 0 0 '2 2 1 '$ 0  PHQ- 9 Score    '5 4 1      '$ Fall Risk    11/11/2021   11:03 AM 08/27/2021    9:09 AM 10/29/2020    9:07 AM 08/16/2020   10:59 AM 02/07/2020   11:07 AM  Fall Risk   Falls in the past year? 0 0 0 0 0  Number falls in past yr: 0 0 0    Injury with Fall? 0 0 0    Risk for fall due to : No Fall Risks No Fall Risks No Fall Risks No Fall Risks No Fall Risks  Follow up Falls evaluation completed Falls prevention discussed;Falls evaluation completed Falls evaluation  completed Falls evaluation completed      Objective:  TYLIN STRADLEY seemed alert and oriented and Victoria Benjamin participated appropriately during our telephone visit.  Blood Pressure Weight BMI  BP Readings from Last 3 Encounters:  08/27/21 135/69  05/01/21 135/73  12/10/20 126/86   Wt Readings from Last 3 Encounters:  08/27/21 150 lb (68 kg)  05/01/21 150 lb (68 kg)  12/10/20 146 lb (66.2 kg)   BMI Readings from Last 1 Encounters:  08/27/21 27.44 kg/m    *Unable to obtain current vital signs, weight, and BMI due to telephone visit type  Hearing/Vision  [place] did not seem to have difficulty with hearing/understanding during the telephone conversation Reports that Victoria Benjamin has not had a formal eye exam by an eye care professional within the past year Reports that Victoria Benjamin has not had a formal hearing evaluation within the past year *Unable to fully assess hearing and vision during telephone visit type  Cognitive Function:    11/11/2021   11:08 AM 10/29/2020    9:15 AM  6CIT Screen  What Year? 0 points 0 points  What month? 0 points 0 points  What time? 0 points 0 points  Count back from 20 0 points 0 points  Months in reverse 2 points 0 points  Repeat phrase 0 points 0 points  Total Score 2 points 0 points   (Normal:0-7, Significant for Dysfunction: >8)  Normal Cognitive Function Screening: Yes   Immunization & Health Maintenance Record Immunization History  Administered Date(s) Administered   Fluad Quad(high Dose 65+) 02/07/2020, 02/15/2021   Influenza Split 02/20/2011, 02/05/2012   Influenza Whole 03/31/2007, 02/24/2008, 05/18/2009, 02/21/2010   Influenza, High Dose Seasonal PF 01/22/2017, 02/10/2019   Influenza,inj,Quad PF,6+ Mos 02/02/2013, 02/23/2014, 12/27/2014, 02/28/2016, 01/21/2018   PFIZER(Purple Top)SARS-COV-2 Vaccination 06/07/2019, 06/28/2019, 02/29/2020   Pneumococcal Conjugate-13 12/27/2014   Pneumococcal Polysaccharide-23 02/02/2013  Td 08/30/2008   Zoster  Recombinat (Shingrix) 02/29/2020    Health Maintenance  Topic Date Due   Zoster Vaccines- Shingrix (2 of 2) 11/26/2021 (Originally 04/25/2020)   COVID-19 Vaccine (4 - Pfizer series) 11/27/2021 (Originally 04/25/2020)   TETANUS/TDAP  11/12/2022 (Originally 08/31/2018)   INFLUENZA VACCINE  12/10/2021   Fecal DNA (Cologuard)  09/03/2024   Pneumonia Vaccine 45+ Years old  Completed   DEXA SCAN  Completed   Hepatitis C Screening  Completed   HPV VACCINES  Aged Out   COLONOSCOPY (Pts 45-40yr Insurance coverage will need to be confirmed)  Discontinued       Assessment  This is a routine wellness examination for [employer]  Health Maintenance: Due or Overdue There are no preventive care reminders to display for this patient.   GTheodosia Blenderdoes not need a referral for CCommercial Metals CompanyAssistance: Care Management:   no Social Work:    no Prescription Assistance:  no Nutrition/Diabetes Education:  no   Plan:  Personalized Goals  Goals Addressed               This Visit's Progress     Patient Stated (pt-stated)        Continue to stay healthy.       Personalized Health Maintenance & Screening Recommendations  Td vaccine Shingrix vaccine 2nd dose  Lung Cancer Screening Recommended: no (Low Dose CT Chest recommended if Age 76-80years, 30 pack-year currently smoking OR have quit w/in past 15 years) Hepatitis C Screening recommended: no HIV Screening recommended: no  Advanced Directives: Written information was not prepared per patient's request.  Referrals & Orders No orders of the defined types were placed in this encounter.   Follow-up Plan Follow-up with MHali Marry MD as planned Schedule your vaccines at your pharmacy. Medicare wellness visit in one year. AVS printed and mailed to the patient.   I have personally reviewed and noted the following in the patient's chart:   Medical and social history Use of alcohol, tobacco or illicit drugs   Current medications and supplements Functional ability and status Nutritional status Physical activity Advanced directives List of other physicians Hospitalizations, surgeries, and ER visits in previous 12 months Vitals Screenings to include cognitive, depression, and falls Referrals and appointments  In addition, I have reviewed and discussed with GTheodosia Blendercertain preventive protocols, quality metrics, and best practice recommendations. A written personalized care plan for preventive services as well as general preventive health recommendations is available and can be mailed to the patient at her request.      BTinnie Gens RN BSN  11/11/2021

## 2021-11-11 NOTE — Patient Instructions (Addendum)
[facility] Maintenance Summary and Written Plan of Care  Victoria Benjamin ,  Thank you for allowing me to perform your Medicare Annual Wellness Visit and for your ongoing commitment to your health.   Health Maintenance & Immunization History Health Maintenance  Topic Date Due  . Zoster Vaccines- Shingrix (2 of 2) 11/26/2021 (Originally 04/25/2020)  . COVID-19 Vaccine (4 - Pfizer series) 11/27/2021 (Originally 04/25/2020)  . TETANUS/TDAP  11/12/2022 (Originally 08/31/2018)  . INFLUENZA VACCINE  12/10/2021  . Fecal DNA (Cologuard)  09/03/2024  . Pneumonia Vaccine 41+ Years old  Completed  . DEXA SCAN  Completed  . Hepatitis C Screening  Completed  . HPV VACCINES  Aged Out  . COLONOSCOPY (Pts 45-8yr Insurance coverage will need to be confirmed)  Discontinued   Immunization History  Administered Date(s) Administered  . Fluad Quad(high Dose 65+) 02/07/2020, 02/15/2021  . Influenza Split 02/20/2011, 02/05/2012  . Influenza Whole 03/31/2007, 02/24/2008, 05/18/2009, 02/21/2010  . Influenza, High Dose Seasonal PF 01/22/2017, 02/10/2019  . Influenza,inj,Quad PF,6+ Mos 02/02/2013, 02/23/2014, 12/27/2014, 02/28/2016, 01/21/2018  . PFIZER(Purple Top)SARS-COV-2 Vaccination 06/07/2019, 06/28/2019, 02/29/2020  . Pneumococcal Conjugate-13 12/27/2014  . Pneumococcal Polysaccharide-23 02/02/2013  . Td 08/30/2008  . Zoster Recombinat (Shingrix) 02/29/2020    These are the patient goals that we discussed:  Goals Addressed              This Visit's Progress   .  Patient Stated (pt-stated)        Continue to stay healthy.        This is a list of Health Maintenance Items that are overdue or due now: Td vaccine Shingrix vaccine 2nd dose  Orders/Referrals Placed Today: No orders of the defined types were placed in this encounter.  (Contact our referral department at [phone] you have not spoken with someone about your referral appointment within the  next 5 days)    Follow-up Plan Follow-up with MHali Marry MD as planned Schedule your vaccines at your pharmacy. Medicare wellness visit in one year. AVS printed and mailed to the patient.      Health Maintenance, Female Adopting a healthy lifestyle and getting preventive care are important in promoting health and wellness. Ask your health care provider about: The right schedule for you to have regular tests and exams. Things you can do on your own to prevent diseases and keep yourself healthy. What should I know about diet, weight, and exercise? Eat a healthy diet  Eat a diet that includes plenty of vegetables, fruits, low-fat dairy products, and lean protein. Do not eat a lot of foods that are high in solid fats, added sugars, or sodium. Maintain a healthy weight Body mass index (BMI) is used to identify weight problems. It estimates body fat based on height and weight. Your health care provider can help determine your BMI and help you achieve or maintain a healthy weight. Get regular exercise Get regular exercise. This is one of the most important things you can do for your health. Most adults should: Exercise for at least 150 minutes each week. The exercise should increase your heart rate and make you sweat (moderate-intensity exercise). Do strengthening exercises at least twice a week. This is in addition to the moderate-intensity exercise. Spend less time sitting. Even light physical activity can be beneficial. Watch cholesterol and blood lipids Have your blood tested for lipids and cholesterol at 76years of age, then have this test every 5 years. Have your cholesterol levels checked  more often if: Your lipid or cholesterol levels are high. You are older than 76 years of age. You are at high risk for heart disease. What should I know about cancer screening? Depending on your health history and family history, you may need to have cancer screening at various ages.  This may include screening for: Breast cancer. Cervical cancer. Colorectal cancer. Skin cancer. Lung cancer. What should I know about heart disease, diabetes, and high blood pressure? Blood pressure and heart disease High blood pressure causes heart disease and increases the risk of stroke. This is more likely to develop in people who have high blood pressure readings or are overweight. Have your blood pressure checked: Every 3-5 years if you are 64-46 years of age. Every year if you are 33 years old or older. Diabetes Have regular diabetes screenings. This checks your fasting blood sugar level. Have the screening done: Once every three years after age 70 if you are at a normal weight and have a low risk for diabetes. More often and at a younger age if you are overweight or have a high risk for diabetes. What should I know about preventing infection? Hepatitis B If you have a higher risk for hepatitis B, you should be screened for this virus. Talk with your health care provider to find out if you are at risk for hepatitis B infection. Hepatitis C Testing is recommended for: Everyone born from [birth year] through 1965. Anyone with known risk factors for hepatitis C. Sexually transmitted infections (STIs) Get screened for STIs, including gonorrhea and chlamydia, if: You are sexually active and are younger than 76 years of age. You are older than 76 years of age and your health care provider tells you that you are at risk for this type of infection. Your sexual activity has changed since you were last screened, and you are at increased risk for chlamydia or gonorrhea. Ask your health care provider if you are at risk. Ask your health care provider about whether you are at high risk for HIV. Your health care provider may recommend a prescription medicine to help prevent HIV infection. If you choose to take medicine to prevent HIV, you should first get tested for HIV. You should then be tested every 3  months for as long as you are taking the medicine. Pregnancy If you are about to stop having your period (premenopausal) and you may become pregnant, seek counseling before you get pregnant. Take 400 to 800 micrograms (mcg) of folic acid every day if you become pregnant. Ask for birth control (contraception) if you want to prevent pregnancy. Osteoporosis and menopause Osteoporosis is a disease in which the bones lose minerals and strength with aging. This can result in bone fractures. If you are 27 years old or older, or if you are at risk for osteoporosis and fractures, ask your health care provider if you should: Be screened for bone loss. Take a calcium or vitamin D supplement to lower your risk of fractures. Be given hormone replacement therapy (HRT) to treat symptoms of menopause. Follow these instructions at home: Alcohol use Do not drink alcohol if: Your health care provider tells you not to drink. You are pregnant, may be pregnant, or are planning to become pregnant. If you drink alcohol: Limit how much you have to: 0-1 drink a day. Know how much alcohol is in your drink. In the [place] one drink equals one 12 oz bottle of beer (355 mL), one 5 oz glass of wine (148  mL), or one 1 oz glass of hard liquor (44 mL). Lifestyle Do not use any products that contain nicotine or tobacco. These products include cigarettes, chewing tobacco, and vaping devices, such as e-cigarettes. If you need help quitting, ask your health care provider. Do not use street drugs. Do not share needles. Ask your health care provider for help if you need support or information about quitting drugs. General instructions Schedule regular health, dental, and eye exams. Stay current with your vaccines. Tell your health care provider if: You often feel depressed. You have ever been abused or do not feel safe at home. Summary Adopting a healthy lifestyle and getting preventive care are important in promoting health  and wellness. Follow your health care provider's instructions about healthy diet, exercising, and getting tested or screened for diseases. Follow your health care provider's instructions on monitoring your cholesterol and blood pressure. This information is not intended to replace advice given to you by your health care provider. Make sure you discuss any questions you have with your health care provider. Document Revised: 09/17/2020 Document Reviewed: 09/17/2020 Elsevier Patient Education  [place].

## 2021-11-20 ENCOUNTER — Other Ambulatory Visit: Payer: Self-pay | Admitting: Family Medicine

## 2021-11-20 DIAGNOSIS — J449 Chronic obstructive pulmonary disease, unspecified: Secondary | ICD-10-CM

## 2022-02-05 ENCOUNTER — Encounter: Payer: Self-pay | Admitting: Family Medicine

## 2022-02-05 ENCOUNTER — Ambulatory Visit (INDEPENDENT_AMBULATORY_CARE_PROVIDER_SITE_OTHER): Payer: Medicare Other | Admitting: Family Medicine

## 2022-02-05 VITALS — BP 111/94 | HR 80 | Temp 98.4°F | Ht 62.0 in | Wt 150.0 lb

## 2022-02-05 DIAGNOSIS — J029 Acute pharyngitis, unspecified: Secondary | ICD-10-CM

## 2022-02-05 DIAGNOSIS — Z23 Encounter for immunization: Secondary | ICD-10-CM

## 2022-02-05 DIAGNOSIS — R051 Acute cough: Secondary | ICD-10-CM | POA: Diagnosis not present

## 2022-02-05 DIAGNOSIS — J069 Acute upper respiratory infection, unspecified: Secondary | ICD-10-CM

## 2022-02-05 DIAGNOSIS — J449 Chronic obstructive pulmonary disease, unspecified: Secondary | ICD-10-CM

## 2022-02-05 LAB — POCT INFLUENZA A/B
Influenza A, POC: NEGATIVE
Influenza B, POC: NEGATIVE

## 2022-02-05 LAB — POC COVID19 BINAXNOW: SARS Coronavirus 2 Ag: NEGATIVE

## 2022-02-05 MED ORDER — GUAIFENESIN-CODEINE 100-10 MG/5ML PO SYRP: 5.0000 mL | ORAL_SOLUTION | Freq: Every evening | ORAL | 0 refills | Status: DC | PRN

## 2022-02-05 NOTE — Assessment & Plan Note (Addendum)
Discussed that I am more than happy to change the Spiriva we do not have to use the specific medication.  We can always see if there is something that she tolerates a little bit better. Will schedule for f/u spirometry.

## 2022-02-05 NOTE — Progress Notes (Signed)
Acute Office Visit  Subjective:     Patient ID: Victoria Benjamin, female    DOB: [birth year], 76 y.o.   MRN: [id number]  Chief Complaint  Patient presents with   Sore Throat    HPI Patient is in today for cough and sore throat.  She said it really started last night.  Initially her throat started hurting on the right side.  She says it felt like a hot poker inside of her throat today it is more her entire throat.  Cough also started yesterday.  It was severe enough that it kept her awake most of last night.  No significant nasal congestion just a little bit of nasal pressure today.  No fever or chills or sweats.  No GI symptoms.  Follow-up COPD-she also reports that the Spiriva tends to irritate in her throat when she uses a she is continued to use it.  But she is also seem to hit her Medicare gap so it is also been much more costly.  ROS      Objective:    BP (!) 111/94   Pulse 80   Temp 98.4 F (36.9 C)   Ht '5\' 2"'$  (1.575 m)   Wt 150 lb (68 kg)   SpO2 97%   BMI 27.44 kg/m    Physical Exam Constitutional:      Appearance: She is well-developed.  HENT:     Head: Normocephalic and atraumatic.     Right Ear: External ear normal.     Left Ear: External ear normal.     Nose: Nose normal.  Eyes:     Conjunctiva/sclera: Conjunctivae normal.     Pupils: Pupils are equal, round, and reactive to light.  Neck:     Thyroid: No thyromegaly.  Cardiovascular:     Rate and Rhythm: Normal rate and regular rhythm.     Heart sounds: Normal heart sounds.  Pulmonary:     Effort: Pulmonary effort is normal.     Breath sounds: Normal breath sounds. No wheezing.  Musculoskeletal:     Cervical back: Neck supple.  Lymphadenopathy:     Cervical: No cervical adenopathy.  Skin:    General: Skin is warm and dry.  Neurological:     Mental Status: She is alert and oriented to person, place, and time.     Results for orders placed or performed in visit on 02/05/22  POCT Influenza  A/B  Result Value Ref Range   Influenza A, POC Negative Negative   Influenza B, POC Negative Negative  POC COVID-19  Result Value Ref Range   SARS Coronavirus 2 Ag Negative Negative        Assessment & Plan:   Problem List Items Addressed This Visit       Respiratory   Chronic obstructive pulmonary disease ([place])    Discussed that I am more than happy to change the Spiriva we do not have to use the specific medication.  We can always see if there is something that she tolerates a little bit better. Will schedule for f/u spirometry.       Relevant Medications   guaiFENesin-codeine (ROBITUSSIN AC) 100-10 MG/5ML syrup   Other Visit Diagnoses     Acute cough    -  Primary   Relevant Orders   POCT Influenza A/B (Completed)   POC COVID-19 (Completed)   Sore throat       Relevant Orders   POCT Influenza A/B (Completed)   POC COVID-19 (Completed)  Need for immunization against influenza       Relevant Orders   Flu Vaccine QUAD High Dose(Fluad) (Completed)   Viral upper respiratory tract infection          URI - recommend conservative measures.  Cough med given.  If not better on one week then please let us know. Neg for flu and COVID.      Meds ordered this encounter  Medications   guaiFENesin-codeine (ROBITUSSIN AC) 100-10 MG/5ML syrup    Sig: Take 5 mLs by mouth at bedtime as needed for cough.    Dispense:  118 mL    Refill:  0    No follow-ups on file.  [name], MD

## 2022-02-10 ENCOUNTER — Telehealth: Payer: Self-pay | Admitting: *Deleted

## 2022-02-11 NOTE — Telephone Encounter (Signed)
LVM advising pt that she is scheduled for Spirometry on 10/19 she will see the nurse at 9 am and Dr. Madilyn Fireman afterwards to go over her results.

## 2022-02-26 ENCOUNTER — Ambulatory Visit: Payer: Medicare Other | Admitting: Family Medicine

## 2022-02-27 ENCOUNTER — Ambulatory Visit: Payer: Medicare Other | Admitting: Family Medicine

## 2022-02-27 ENCOUNTER — Ambulatory Visit (INDEPENDENT_AMBULATORY_CARE_PROVIDER_SITE_OTHER): Payer: Medicare Other | Admitting: Family Medicine

## 2022-02-27 VITALS — BP 118/57 | HR 78 | Ht 62.0 in | Wt 147.0 lb

## 2022-02-27 DIAGNOSIS — J449 Chronic obstructive pulmonary disease, unspecified: Secondary | ICD-10-CM

## 2022-02-27 DIAGNOSIS — T50905A Adverse effect of unspecified drugs, medicaments and biological substances, initial encounter: Secondary | ICD-10-CM

## 2022-02-27 MED ORDER — ALBUTEROL SULFATE HFA 108 (90 BASE) MCG/ACT IN AERS
2.0000 | INHALATION_SPRAY | Freq: Once | RESPIRATORY_TRACT | Status: AC
  Administered 2022-02-27: 2 via RESPIRATORY_TRACT

## 2022-02-27 NOTE — Assessment & Plan Note (Signed)
Spirometry 02/27/2022 shows FVC of 64%, FEV1 of 53% with a ratio of 61.  12% improvement in FEV1 after albuterol and 15% improvement in FEF 25-75.  She had a 3% improvement in FVC and a 4% improvement in the FEV1.  Doing well on Spiriva continue with current regimen.  Use albuterol as needed.  Great stable results over the last 15 months.

## 2022-02-27 NOTE — Progress Notes (Signed)
   Established Patient Office Visit  Subjective   Patient ID: Victoria Benjamin, female    DOB: [birth year]  Age: 76 y.o. MRN: [id number]  Chief Complaint  Patient presents with   COPD    HPI  F/U COPD -here today for spirometry.  She is still using Spiriva daily.  She did work out in the yard a little bit yesterday so feels like she is got a little bit more mucus and congestion today she said normally she wears a mask but she did not yesterday.  She is in the donut hole with her Spiriva currently.  The last time I saw her that also prescribed codeine cough syrup she wanted to let me know that she is not able to tolerate codeine.  She said it made her feel like she could not move and most like she was stiff.    ROS    Objective:     BP (!) 118/57   Pulse 78   Ht '5\' 2"'$  (1.575 m)   Wt 147 lb (66.7 kg)   SpO2 96%   BMI 26.89 kg/m    Physical Exam Vitals and nursing note reviewed.  Constitutional:      Appearance: She is well-developed.  HENT:     Head: Normocephalic and atraumatic.  Cardiovascular:     Rate and Rhythm: Normal rate and regular rhythm.     Heart sounds: Normal heart sounds.  Pulmonary:     Effort: Pulmonary effort is normal.     Breath sounds: Normal breath sounds.  Skin:    General: Skin is warm and dry.  Neurological:     Mental Status: She is alert and oriented to person, place, and time.  Psychiatric:        Behavior: Behavior normal.      No results found for any visits on 02/27/22.    The 10-year ASCVD risk score (Arnett DK, et al., 2019) is: 13.6%    Assessment & Plan:   Problem List Items Addressed This Visit       Respiratory   Chronic obstructive pulmonary disease ([place]) - Primary    Spirometry 02/27/2022 shows FVC of 64%, FEV1 of 53% with a ratio of 61.  12% improvement in FEV1 after albuterol and 15% improvement in FEF 25-75.  She had a 3% improvement in FVC and a 4% improvement in the FEV1.  Doing well on Spiriva continue  with current regimen.  Use albuterol as needed.  Great stable results over the last 15 months.      Relevant Orders   PR EVAL OF BRONCHOSPASM   Other Visit Diagnoses     Medication side effect, initial encounter          Codeine added to intolerance list.    Return in about 6 months (around 08/29/2022) for COPD, cholesterol, prediabetes.    [name], MD

## 2022-03-23 ENCOUNTER — Other Ambulatory Visit: Payer: Self-pay | Admitting: Family Medicine

## 2022-04-23 ENCOUNTER — Ambulatory Visit (INDEPENDENT_AMBULATORY_CARE_PROVIDER_SITE_OTHER): Payer: Medicare Other | Admitting: Family Medicine

## 2022-04-23 VITALS — Temp 97.6°F

## 2022-04-23 DIAGNOSIS — Z2911 Encounter for prophylactic immunotherapy for respiratory syncytial virus (RSV): Secondary | ICD-10-CM | POA: Diagnosis not present

## 2022-04-23 DIAGNOSIS — Z23 Encounter for immunization: Secondary | ICD-10-CM | POA: Diagnosis not present

## 2022-04-23 MED ORDER — COVID-19 MRNA 2023-2024 VACCINE (COMIRNATY) 0.3 ML INJECTION: 0.3000 mL | Freq: Once | INTRAMUSCULAR | 0 refills | Status: DC

## 2022-04-23 NOTE — Progress Notes (Signed)
   Subjective:    Patient ID: Victoria Benjamin, female    DOB: May 18, 1945, 76 y.o.   MRN: [id number]  HPI Pt seen in clinic for Covid vaccine and RSV vaccine.   Review of Systems     Objective:   Physical Exam        Assessment & Plan:   Pt tolerated vaccines well RSV right deltoid and COVID vaccine left deltoid. Pt advised of possible side affects, VIS provided and immunizations entered into [facility].

## 2022-04-23 NOTE — Progress Notes (Signed)
Agree with above 

## 2022-05-20 ENCOUNTER — Other Ambulatory Visit: Payer: Self-pay | Admitting: *Deleted

## 2022-05-20 DIAGNOSIS — K2100 Gastro-esophageal reflux disease with esophagitis, without bleeding: Secondary | ICD-10-CM

## 2022-05-20 MED ORDER — OMEPRAZOLE 40 MG PO CPDR: 40.0000 mg | DELAYED_RELEASE_CAPSULE | Freq: Every day | ORAL | 2 refills | Status: DC

## 2022-06-05 ENCOUNTER — Other Ambulatory Visit: Payer: Self-pay | Admitting: Family Medicine

## 2022-06-05 DIAGNOSIS — K2100 Gastro-esophageal reflux disease with esophagitis, without bleeding: Secondary | ICD-10-CM

## 2022-06-11 ENCOUNTER — Ambulatory Visit: Payer: Medicare Other | Admitting: Family Medicine

## 2022-06-11 ENCOUNTER — Telehealth (INDEPENDENT_AMBULATORY_CARE_PROVIDER_SITE_OTHER): Payer: Medicare Other | Admitting: Medical-Surgical

## 2022-06-11 ENCOUNTER — Encounter: Payer: Self-pay | Admitting: Medical-Surgical

## 2022-06-11 VITALS — BP 121/76 | HR 79 | Resp 20 | Ht 62.0 in | Wt 147.2 lb

## 2022-06-11 DIAGNOSIS — U071 COVID-19: Secondary | ICD-10-CM

## 2022-06-11 MED ORDER — BENZONATATE 200 MG PO CAPS: 200.0000 mg | ORAL_CAPSULE | Freq: Three times a day (TID) | ORAL | 0 refills | Status: DC | PRN

## 2022-06-11 MED ORDER — NIRMATRELVIR/RITONAVIR (PAXLOVID)TABLET: 3.0000 | ORAL_TABLET | Freq: Two times a day (BID) | ORAL | 0 refills | Status: AC

## 2022-06-11 MED ORDER — DEXAMETHASONE 4 MG PO TABS: 4.0000 mg | ORAL_TABLET | Freq: Two times a day (BID) | ORAL | 0 refills | Status: DC

## 2022-06-11 NOTE — Progress Notes (Signed)
Established Patient Office Visit  Subjective   Patient ID: Victoria Benjamin, female   DOB: Jul 12, 1945 Age: 77 y.o. MRN: [id number]   Chief Complaint  Patient presents with   Covid Positive    HPI Very pleasant 77 year old female presenting today with reports of upper respiratory symptoms that originally started on Saturday.  Her symptoms have included sore throat, postnasal drip, nasal congestion, body aches, and a cough that is productive of white mucus that sometimes looks green.  She notes a significantly reduced appetite but has had no fever, chills, chest pain, shortness of breath, or other GI symptoms.  Feels that her symptoms got better after a couple of days however then they worsened again.  Has been using nasal saline rinses/sprays, Benadryl, and Tylenol at home with minimal relief of symptoms.  Tested on Tuesday for COVID with positive results.  Using albuterol and Spiriva inhalers as prescribed.   Objective:    Vitals:   06/11/22 1115  BP: 121/76  Pulse: 79  Resp: 20  Height: '5\' 2"'$  (1.575 m)  Weight: 147 lb 3.2 oz (66.8 kg)  SpO2: 96%  BMI (Calculated): 26.92    Physical Exam Vitals and nursing note reviewed.  Constitutional:      General: She is not in acute distress.    Appearance: Normal appearance. She is not ill-appearing.  HENT:     Head: Normocephalic and atraumatic.     Right Ear: Tympanic membrane, ear canal and external ear normal. There is no impacted cerumen.     Left Ear: Tympanic membrane, ear canal and external ear normal. There is no impacted cerumen.     Nose: Congestion present.     Mouth/Throat:     Mouth: Mucous membranes are moist.     Pharynx: Posterior oropharyngeal erythema (Mild posterior oropharyngeal) present.  Eyes:     General: No scleral icterus.       Right eye: No discharge.        Left eye: No discharge.     Extraocular Movements: Extraocular movements intact.     Conjunctiva/sclera: Conjunctivae normal.     Pupils: Pupils  are equal, round, and reactive to light.  Cardiovascular:     Rate and Rhythm: Normal rate and regular rhythm.     Pulses: Normal pulses.     Heart sounds: Normal heart sounds.  Pulmonary:     Effort: Pulmonary effort is normal. No respiratory distress.     Breath sounds: Normal breath sounds. No wheezing, rhonchi or rales.  Skin:    General: Skin is warm and dry.  Neurological:     Mental Status: She is alert and oriented to person, place, and time.  Psychiatric:        Mood and Affect: Mood normal.        Behavior: Behavior normal.        Thought Content: Thought content normal.        Judgment: Judgment normal.   No results found for this or any previous visit (from the past 24 hour(s)).     The 10-year ASCVD risk score (Arnett DK, et al., 2019) is: 15.9%   Values used to calculate the score:     Age: 9 years     Sex: Female     Is Non-Hispanic African American: No     Diabetic: No     Tobacco smoker: No     Systolic Blood Pressure: 614 mmHg     Is BP treated: No  HDL Cholesterol: 52 mg/dL     Total Cholesterol: 161 mg/dL   Assessment & Plan:   1. COVID-19 virus infection Positive COVID 19 home test with a history of COPD.  Continue Spiriva and albuterol inhalers as prescribed.  Adding Paxlovid twice daily x 5 days.  Will go ahead and do a burst of Decadron 4 mg twice daily x 5 days.  Adding Tessalon Perles for cough.  Reviewed over-the-counter options for further symptom management. - nirmatrelvir/ritonavir (PAXLOVID) 20 x 150 MG & 10 x '100MG'$  TABS; Take 3 tablets by mouth 2 (two) times daily for 5 days. (Take nirmatrelvir 150 mg two tablets twice daily for 5 days and ritonavir 100 mg one tablet twice daily for 5 days) Patient GFR is 78.  Dispense: 30 tablet; Refill: 0 - dexamethasone (DECADRON) 4 MG tablet; Take 1 tablet (4 mg total) by mouth 2 (two) times daily with a meal.  Dispense: 10 tablet; Refill: 0 - benzonatate (TESSALON) 200 MG capsule; Take 1 capsule (200 mg  total) by mouth 3 (three) times daily as needed for cough.  Dispense: 45 capsule; Refill: 0  Return if symptoms worsen or fail to improve.  ___________________________________________ Clearnce Sorrel, DNP, APRN, FNP-BC [facility] [place]

## 2022-07-08 ENCOUNTER — Other Ambulatory Visit: Payer: Self-pay | Admitting: Family Medicine

## 2022-07-08 DIAGNOSIS — Z1231 Encounter for screening mammogram for malignant neoplasm of breast: Secondary | ICD-10-CM

## 2022-07-09 ENCOUNTER — Ambulatory Visit (INDEPENDENT_AMBULATORY_CARE_PROVIDER_SITE_OTHER): Payer: Medicare Other

## 2022-07-09 DIAGNOSIS — Z1231 Encounter for screening mammogram for malignant neoplasm of breast: Secondary | ICD-10-CM

## 2022-07-10 ENCOUNTER — Other Ambulatory Visit: Payer: Self-pay | Admitting: Family Medicine

## 2022-07-10 DIAGNOSIS — E785 Hyperlipidemia, unspecified: Secondary | ICD-10-CM

## 2022-07-14 ENCOUNTER — Ambulatory Visit (INDEPENDENT_AMBULATORY_CARE_PROVIDER_SITE_OTHER): Payer: Medicare Other | Admitting: Medical-Surgical

## 2022-07-14 ENCOUNTER — Encounter: Payer: Self-pay | Admitting: Medical-Surgical

## 2022-07-14 VITALS — BP 126/73 | HR 68 | Resp 20 | Ht 62.0 in | Wt 146.7 lb

## 2022-07-14 DIAGNOSIS — J441 Chronic obstructive pulmonary disease with (acute) exacerbation: Secondary | ICD-10-CM

## 2022-07-14 MED ORDER — PREDNISONE 20 MG PO TABS: ORAL_TABLET | ORAL | 0 refills | Status: AC

## 2022-07-14 NOTE — Progress Notes (Signed)
Established Patient Office Visit  Subjective   Patient ID: Victoria Benjamin, female   DOB: 1945/10/24 Age: 77 y.o. MRN: [id number]   Chief Complaint  Patient presents with   Cough   Shortness of Breath   Nasal Congestion   HPI Pleasant 77 year old female presenting today with complaints of being sick since before Christmas.  At her last visit on 06/11/2022, she had tested positive for COVID.  She was treated with Paxlovid and Decadron which she reports was somewhat helpful however her symptoms have not fully resolved.  Today she continues to report significant nasal congestion, postnasal drip, shortness of breath, and a cough that is productive of clear sputum.  Notes that she may have had a little fever and chills last week but over the last couple of days, this has not been an issue.   Objective:    Vitals:   07/14/22 1049  BP: 126/73  Pulse: 68  Resp: 20  Height: '5\' 2"'$  (1.575 m)  Weight: 146 lb 11.2 oz (66.5 kg)  SpO2: 93%  BMI (Calculated): 26.83    Physical Exam Vitals reviewed.  Constitutional:      General: She is not in acute distress.    Appearance: Normal appearance. She is well-developed. She is not ill-appearing.  HENT:     Head: Normocephalic and atraumatic.     Mouth/Throat:     Mouth: Mucous membranes are moist.     Pharynx: No pharyngeal swelling or oropharyngeal exudate.  Eyes:     Extraocular Movements: Extraocular movements intact.     Pupils: Pupils are equal, round, and reactive to light.  Cardiovascular:     Rate and Rhythm: Normal rate and regular rhythm.     Pulses: Normal pulses.     Heart sounds: Normal heart sounds.  Pulmonary:     Effort: Pulmonary effort is normal. No respiratory distress.     Breath sounds: Normal breath sounds. No wheezing, rhonchi or rales.  Musculoskeletal:     Cervical back: Normal range of motion and neck supple.  Lymphadenopathy:     Cervical: No cervical adenopathy.  Skin:    General: Skin is warm and dry.   Neurological:     Mental Status: She is alert and oriented to person, place, and time.  Psychiatric:        Mood and Affect: Mood normal.        Behavior: Behavior normal.        Thought Content: Thought content normal.        Judgment: Judgment normal.   No results found for this or any previous visit (from the past 24 hour(s)).     The 10-year ASCVD risk score (Arnett DK, et al., 2019) is: 17.1%   Values used to calculate the score:     Age: 89 years     Sex: Female     Is Non-Hispanic African American: No     Diabetic: No     Tobacco smoker: No     Systolic Blood Pressure: 123XX123 mmHg     Is BP treated: No     HDL Cholesterol: 52 mg/dL     Total Cholesterol: 161 mg/dL   Assessment & Plan:   1. COPD with acute exacerbation ([facility]) Continue Spiriva and albuterol as ordered.  Adding a prednisone taper.  No indication of bacterial infection today and lung exam showed clear lung sounds throughout.  Holding off on further antibiotics at this point.  Advised her to reach out should  her symptoms not improve with the steroid taper.  Patient verbalized understanding and is agreeable to the plan.  Return if symptoms worsen or fail to improve.  ___________________________________________ Clearnce Sorrel, DNP, APRN, FNP-BC [facility] [place]

## 2022-07-14 NOTE — Progress Notes (Signed)
Please call patient. Normal mammogram.  Repeat in 1 year.  

## 2022-08-20 ENCOUNTER — Telehealth: Payer: Self-pay | Admitting: *Deleted

## 2022-08-20 NOTE — Telephone Encounter (Signed)
Pt called and stated that she is experiencing a constant cough. She has COPD however she doesn't feel this is related to COPD it's more allergies.   She has clear nasal drainage, and a cough from drainage to the back of her throat.   No facial pain/pressure,f/s/c, no ear fullness, or sore throat.   She has been taking Allegra and Flonase and these have not been working well for her sxs.    I advised her to stop the Allegra and Flonase. And try Zyrtec and either Mucinex or Coricidin for the cough. If sxs begin to get worse by Friday or Monday to call. She voiced understanding and agreed.

## 2022-09-01 ENCOUNTER — Ambulatory Visit (INDEPENDENT_AMBULATORY_CARE_PROVIDER_SITE_OTHER): Payer: Medicare Other | Admitting: Family Medicine

## 2022-09-01 ENCOUNTER — Encounter: Payer: Self-pay | Admitting: Family Medicine

## 2022-09-01 VITALS — BP 132/74 | HR 79 | Ht 62.0 in | Wt 148.0 lb

## 2022-09-01 DIAGNOSIS — J449 Chronic obstructive pulmonary disease, unspecified: Secondary | ICD-10-CM | POA: Diagnosis not present

## 2022-09-01 DIAGNOSIS — R7301 Impaired fasting glucose: Secondary | ICD-10-CM

## 2022-09-01 DIAGNOSIS — E785 Hyperlipidemia, unspecified: Secondary | ICD-10-CM

## 2022-09-01 LAB — POCT GLYCOSYLATED HEMOGLOBIN (HGB A1C): Hemoglobin A1C: 5.9 % — AB (ref 4.0–5.6)

## 2022-09-01 NOTE — Assessment & Plan Note (Signed)
A1c looks great today at 5.9.  Continue current regimen and follow-up in 6 months continue work on [employer] and staying active.  Lab Results  Component Value Date   HGBA1C 5.9 (A) 09/01/2022

## 2022-09-01 NOTE — Progress Notes (Signed)
Established Patient Office Visit  Subjective   Patient ID: Victoria Benjamin, female    DOB: 12/30/1945  Age: 77 y.o. MRN: [id number]  Chief Complaint  Patient presents with   COPD   ifg    HPI COPD -she is doing well overall but was noticing a little bit more wheezing last week with the pollen was high but she is actually felt better in the last couple of days.  She has been using her Spiriva daily.  Impaired fasting glucose-no increased thirst or urination. No symptoms consistent with hypoglycemia. 5.8 a year ago.   Hyperlipidemia - tolerating stating well with no myalgias or significant side effects.  Lab Results  Component Value Date   CHOL 161 08/27/2021   HDL 52 08/27/2021   LDLCALC 85 08/27/2021   TRIG 138 08/27/2021   CHOLHDL 3.1 08/27/2021         ROS    Objective:     BP 132/74   Pulse 79   Ht  (1.575 m)   Wt 148 lb (67.1 kg)   SpO2 98%   BMI 27.07 kg/m    Physical Exam Vitals and nursing note reviewed.  Constitutional:      Appearance: She is well-developed.  HENT:     Head: Normocephalic and atraumatic.  Cardiovascular:     Rate and Rhythm: Normal rate and regular rhythm.     Heart sounds: Normal heart sounds.  Pulmonary:     Effort: Pulmonary effort is normal.     Breath sounds: Normal breath sounds.  Skin:    General: Skin is warm and dry.  Neurological:     Mental Status: She is alert and oriented to person, place, and time.  Psychiatric:        Behavior: Behavior normal.      Results for orders placed or performed in visit on 09/01/22  POCT glycosylated hemoglobin (Hb A1C)  Result Value Ref Range   Hemoglobin A1C 5.9 (A) 4.0 - 5.6 %   HbA1c POC (<> result, manual entry)     HbA1c, POC (prediabetic range)     HbA1c, POC (controlled diabetic range)        The 10-year ASCVD risk score (Arnett DK, et al., 2019) is: 18.5%    Assessment & Plan:   Problem List Items Addressed This Visit       Respiratory    Chronic obstructive pulmonary disease    You Spiriva and as needed albuterol.  Though if she starts having more chronic increase in cough or sputum production we can always consider switching to Stiolto for better symptom control.      Relevant Orders   Lipid Panel w/reflex Direct LDL   COMPLETE METABOLIC PANEL WITH GFR   CBC     Endocrine   IFG (impaired fasting glucose) - Primary    A1c looks great today at 5.9.  Continue current regimen and follow-up in 6 months continue work on [employer] and staying active.  Lab Results  Component Value Date   HGBA1C 5.9 (A) 09/01/2022        Relevant Orders   POCT glycosylated hemoglobin (Hb A1C) (Completed)   Lipid Panel w/reflex Direct LDL   COMPLETE METABOLIC PANEL WITH GFR   CBC     Other   Hyperlipidemia    Continue daily statin.      Relevant Orders   Lipid Panel w/reflex Direct LDL   COMPLETE METABOLIC PANEL WITH GFR   CBC  Return in about 6 months (around 03/03/2023) for Pre-diabetes, COPD.    Nani Gasser, MD

## 2022-09-01 NOTE — Assessment & Plan Note (Signed)
Continue daily statin. 

## 2022-09-01 NOTE — Assessment & Plan Note (Signed)
You Spiriva and as needed albuterol.  Though if she starts having more chronic increase in cough or sputum production we can always consider switching to Stiolto for better symptom control.

## 2022-09-02 LAB — COMPLETE METABOLIC PANEL WITH GFR
AG Ratio: 2 (calc) (ref 1.0–2.5)
ALT: 14 U/L (ref 6–29)
AST: 18 U/L (ref 10–35)
Albumin: 4.5 g/dL (ref 3.6–5.1)
Alkaline phosphatase (APISO): 95 U/L (ref 37–153)
BUN: 9 mg/dL (ref 7–25)
CO2: 31 mmol/L (ref 20–32)
Calcium: 9.8 mg/dL (ref 8.6–10.4)
Chloride: 105 mmol/L (ref 98–110)
Creat: 0.76 mg/dL (ref 0.60–1.00)
Globulin: 2.3 g/dL (calc) (ref 1.9–3.7)
Glucose, Bld: 104 mg/dL — ABNORMAL HIGH (ref 65–99)
Potassium: 3.9 mmol/L (ref 3.5–5.3)
Sodium: 143 mmol/L (ref 135–146)
Total Bilirubin: 0.6 mg/dL (ref 0.2–1.2)
Total Protein: 6.8 g/dL (ref 6.1–8.1)
eGFR: 81 mL/min/{1.73_m2} (ref >=60)

## 2022-09-02 LAB — LIPID PANEL W/REFLEX DIRECT LDL
Cholesterol: 161 mg/dL (ref <200)
HDL: 47 mg/dL — ABNORMAL LOW (ref >=50)
LDL Cholesterol (Calc): 87 mg/dL (calc)
Non-HDL Cholesterol (Calc): 114 mg/dL (calc) (ref <130)
Total CHOL/HDL Ratio: 3.4 (calc) (ref <5.0)
Triglycerides: 175 mg/dL — ABNORMAL HIGH (ref <150)

## 2022-09-02 LAB — CBC
HCT: 39.1 % (ref 35.0–45.0)
Hemoglobin: 12.9 g/dL (ref 11.7–15.5)
MCH: 29.9 pg (ref 27.0–33.0)
MCHC: 33 g/dL (ref 32.0–36.0)
MCV: 90.5 fL (ref 80.0–100.0)
MPV: 10.2 fL (ref 7.5–12.5)
Platelets: 248 10*3/uL (ref 140–400)
RBC: 4.32 10*6/uL (ref 3.80–5.10)
RDW: 12.7 % (ref 11.0–15.0)
WBC: 5.4 10*3/uL (ref 3.8–10.8)

## 2022-09-02 NOTE — Progress Notes (Signed)
Kourtni, LDL cholesterol and total cholesterol look great.  Triglycerides are up a little bit again.  Continue to work on [employer].  Glucose was a little borderline so definitely want to keep an eye on that.  Kidney, liver, and blood count look great.

## 2022-09-28 ENCOUNTER — Other Ambulatory Visit: Payer: Self-pay | Admitting: Family Medicine

## 2022-09-28 DIAGNOSIS — J449 Chronic obstructive pulmonary disease, unspecified: Secondary | ICD-10-CM

## 2022-11-14 ENCOUNTER — Other Ambulatory Visit: Payer: Self-pay | Admitting: Family Medicine

## 2022-11-14 DIAGNOSIS — K2100 Gastro-esophageal reflux disease with esophagitis, without bleeding: Secondary | ICD-10-CM

## 2022-11-17 ENCOUNTER — Ambulatory Visit (INDEPENDENT_AMBULATORY_CARE_PROVIDER_SITE_OTHER): Payer: Medicare Other | Admitting: Family Medicine

## 2022-11-17 DIAGNOSIS — Z0000 Encounter for general adult medical examination without abnormal findings: Secondary | ICD-10-CM | POA: Diagnosis not present

## 2022-11-17 NOTE — Patient Instructions (Addendum)
MEDICARE ANNUAL WELLNESS VISIT Health Maintenance Summary and Written Plan of Care  Victoria Benjamin ,  Thank you for allowing me to perform your Medicare Annual Wellness Visit and for your ongoing commitment to your health.   Health Maintenance & Immunization History Health Maintenance  Topic Date Due   INFLUENZA VACCINE  12/11/2022   Medicare Annual Wellness (AWV)  11/17/2023   DTaP/Tdap/Td (3 - Td or Tdap) 11/15/2031   Pneumonia Vaccine 59+ Years old  Completed   DEXA SCAN  Completed   COVID-19 Vaccine  Completed   Hepatitis C Screening  Completed   Zoster Vaccines- Shingrix  Completed   HPV VACCINES  Aged Out   Colonoscopy  Discontinued   Fecal DNA (Cologuard)  Discontinued   Immunization History  Administered Date(s) Administered   COVID-19, mRNA, vaccine(Comirnaty)12 years and older 04/23/2022   Fluad Quad(high Dose 65+) 02/07/2020, 02/15/2021, 02/05/2022   Influenza Split 02/20/2011, 02/05/2012   Influenza Whole 03/31/2007, 02/24/2008, 05/18/2009, 02/21/2010   Influenza, High Dose Seasonal PF 01/22/2017, 02/10/2019   Influenza,inj,Quad PF,6+ Mos 02/02/2013, 02/23/2014, 12/27/2014, 02/28/2016, 01/21/2018   PFIZER(Purple Top)SARS-COV-2 Vaccination 06/07/2019, 06/28/2019, 02/29/2020   Pneumococcal Conjugate-13 12/27/2014   Pneumococcal Polysaccharide-23 02/02/2013   Respiratory Syncytial Virus Vaccine,Recomb Aduvanted(Arexvy) 04/23/2022   Td 08/30/2008   Tdap 11/14/2021   Zoster Recombinant(Shingrix) 02/29/2020, 11/14/2021    These are the patient goals that we discussed:  Goals Addressed               This Visit's Progress     Patient Stated (pt-stated)        Patient stated that she would like to loose weight.         This is a list of Health Maintenance Items that are overdue or due now: Bone densitometry screening    Orders/Referrals Placed Today: No orders of the defined types were placed in this encounter.  (Contact our referral department at  [phone] if you have not spoken with someone about your referral appointment within the next 5 days)    Follow-up Plan Follow-up with Agapito Games, MD as planned Medicare wellness visit in one year.  AVS printed and mailed to the patient.      Health Maintenance, Female Adopting a healthy lifestyle and getting preventive care are important in promoting health and wellness. Ask your health care provider about: The right schedule for you to have regular tests and exams. Things you can do on your own to prevent diseases and keep yourself healthy. What should I know about diet, weight, and exercise? Eat a healthy diet  Eat a diet that includes plenty of vegetables, fruits, low-fat dairy products, and lean protein. Do not eat a lot of foods that are high in solid fats, added sugars, or sodium. Maintain a healthy weight Body mass index (BMI) is used to identify weight problems. It estimates body fat based on height and weight. Your health care provider can help determine your BMI and help you achieve or maintain a healthy weight. Get regular exercise Get regular exercise. This is one of the most important things you can do for your health. Most adults should: Exercise for at least 150 minutes each week. The exercise should increase your heart rate and make you sweat (moderate-intensity exercise). Do strengthening exercises at least twice a week. This is in addition to the moderate-intensity exercise. Spend less time sitting. Even light physical activity can be beneficial. Watch cholesterol and blood lipids Have your blood tested for lipids and cholesterol at 77  years of age, then have this test every 5 years. Have your cholesterol levels checked more often if: Your lipid or cholesterol levels are high. You are older than 77 years of age. You are at high risk for heart disease. What should I know about cancer screening? Depending on your health history and family history, you  may need to have cancer screening at various ages. This may include screening for: Breast cancer. Cervical cancer. Colorectal cancer. Skin cancer. Lung cancer. What should I know about heart disease, diabetes, and high blood pressure? Blood pressure and heart disease High blood pressure causes heart disease and increases the risk of stroke. This is more likely to develop in people who have high blood pressure readings or are overweight. Have your blood pressure checked: Every 3-5 years if you are 60-73 years of age. Every year if you are 33 years old or older. Diabetes Have regular diabetes screenings. This checks your fasting blood sugar level. Have the screening done: Once every three years after age 24 if you are at a normal weight and have a low risk for diabetes. More often and at a younger age if you are overweight or have a high risk for diabetes. What should I know about preventing infection? Hepatitis B If you have a higher risk for hepatitis B, you should be screened for this virus. Talk with your health care provider to find out if you are at risk for hepatitis B infection. Hepatitis C Testing is recommended for: Everyone born from [birth year] through 1965. Anyone with known risk factors for hepatitis C. Sexually transmitted infections (STIs) Get screened for STIs, including gonorrhea and chlamydia, if: You are sexually active and are younger than 77 years of age. You are older than 77 years of age and your health care provider tells you that you are at risk for this type of infection. Your sexual activity has changed since you were last screened, and you are at increased risk for chlamydia or gonorrhea. Ask your health care provider if you are at risk. Ask your health care provider about whether you are at high risk for HIV. Your health care provider may recommend a prescription medicine to help prevent HIV infection. If you choose to take medicine to prevent HIV, you should first  get tested for HIV. You should then be tested every 3 months for as long as you are taking the medicine. Pregnancy If you are about to stop having your period (premenopausal) and you may become pregnant, seek counseling before you get pregnant. Take 400 to 800 micrograms (mcg) of folic acid every day if you become pregnant. Ask for birth control (contraception) if you want to prevent pregnancy. Osteoporosis and menopause Osteoporosis is a disease in which the bones lose minerals and strength with aging. This can result in bone fractures. If you are 75 years old or older, or if you are at risk for osteoporosis and fractures, ask your health care provider if you should: Be screened for bone loss. Take a calcium or vitamin D supplement to lower your risk of fractures. Be given hormone replacement therapy (HRT) to treat symptoms of menopause. Follow these instructions at home: Alcohol use Do not drink alcohol if: Your health care provider tells you not to drink. You are pregnant, may be pregnant, or are planning to become pregnant. If you drink alcohol: Limit how much you have to: 0-1 drink a day. Know how much alcohol is in your drink. In the [place] one drink equals  one 12 oz bottle of beer (355 mL), one 5 oz glass of wine (148 mL), or one 1 oz glass of hard liquor (44 mL). Lifestyle Do not use any products that contain nicotine or tobacco. These products include cigarettes, chewing tobacco, and vaping devices, such as e-cigarettes. If you need help quitting, ask your health care provider. Do not use street drugs. Do not share needles. Ask your health care provider for help if you need support or information about quitting drugs. General instructions Schedule regular health, dental, and eye exams. Stay current with your vaccines. Tell your health care provider if: You often feel depressed. You have ever been abused or do not feel safe at home. Summary Adopting a healthy lifestyle and  getting preventive care are important in promoting health and wellness. Follow your health care provider's instructions about healthy diet, exercising, and getting tested or screened for diseases. Follow your health care provider's instructions on monitoring your cholesterol and blood pressure. This information is not intended to replace advice given to you by your health care provider. Make sure you discuss any questions you have with your health care provider. Document Revised: 09/17/2020 Document Reviewed: 09/17/2020 Elsevier Patient Education  2024 [employer].

## 2022-11-17 NOTE — Progress Notes (Signed)
MEDICARE ANNUAL WELLNESS VISIT  11/17/2022  Telephone Visit Disclaimer This Medicare AWV was conducted by telephone due to national recommendations for restrictions regarding the COVID-19 Pandemic (e.g. social distancing).  I verified, using two identifiers, that I am speaking with Victoria Benjamin or their authorized healthcare agent. I discussed the limitations, risks, security, and privacy concerns of performing an evaluation and management service by telephone and the potential availability of an in-person appointment in the future. The patient expressed understanding and agreed to proceed.  Location of Patient: Home Location of Provider (nurse):  Provider home  Subjective:    Victoria Benjamin is a 77 y.o. female patient of Metheney, Barbarann Ehlers, MD who had a Medicare Annual Wellness Visit today via telephone. Victoria Benjamin is Retired and lives alone. she has 2 children. she reports that she is socially active and does interact with friends/family regularly. she is moderately physically active and enjoys yard work.  Patient Care Team: Agapito Games, MD as PCP - General     11/17/2022   10:16 AM 11/11/2021   11:02 AM 10/29/2020    9:06 AM 11/29/2014    8:52 AM 05/25/2014    1:09 PM  Advanced Directives  Does Patient Have a Medical Advance Directive? Yes Yes Yes Yes Yes  Type of Estate agent of [place];Living will Living will Healthcare Power of [place];Living will  Healthcare Power of [place];Living will  Does patient want to make changes to medical advance directive? No - Patient declined No - Patient declined   No - Patient declined  Copy of Healthcare Power of Attorney in Chart? No - copy requested  No - copy requested No - copy requested No - copy requested    Hospital Utilization Over the Past 12 Months: # of hospitalizations or ER visits: 0 # of surgeries: 0  Review of Systems    Patient reports that her overall health is unchanged compared to  last year.  History obtained from chart review and the patient  Patient Reported Readings (BP, Pulse, CBG, Weight, etc) none  Pain Assessment Pain : No/denies pain     Current Medications & Allergies (verified) Allergies as of 11/17/2022       Reactions   Codeine Other (See Comments)   Muscle stiffness, felt like couldn't move   Hydrocodone Other (See Comments)   Crawling senstaion on skin   Lipitor [atorvastatin] Other (See Comments)   Myalgias, lightheadedness, headaches, cramping        Medication List        Accurate as of November 17, 2022 10:27 AM. If you have any questions, ask your nurse or doctor.          albuterol 108 (90 Base) MCG/ACT inhaler Commonly known as: VENTOLIN HFA Inhale 2 puffs into the lungs every 6 (six) hours as needed for wheezing or shortness of breath.   omeprazole 40 MG capsule Commonly known as: PRILOSEC TAKE 1 CAPSULE BY MOUTH DAILY   rosuvastatin 10 MG tablet Commonly known as: CRESTOR TAKE 1 TABLET BY MOUTH DAILY   Spiriva HandiHaler 18 MCG inhalation capsule Generic drug: tiotropium INHALE THE CONTENTS OF 1 CAPSULE BY MOUTH VIA INHALATION DEVICE  DAILY        History (reviewed): Past Medical History:  Diagnosis Date   COPD (chronic obstructive pulmonary disease) (HCC)    Past Surgical History:  Procedure Laterality Date   ABDOMINAL HYSTERECTOMY     partial   BACK SURGERY     BREAST  ENHANCEMENT SURGERY     BREAST EXCISIONAL BIOPSY Right    BREAST SURGERY     CAPSULOTOMY Right 08/01/2019   CATARACT EXTRACTION Right    HAND SURGERY  2006, 07   NECK SURGERY  2002   Family History  Problem Relation Age of Onset   Hypertension Mother    Arthritis Mother        osteo   Pulmonary embolism Mother    Heart failure Father    Hyperlipidemia Father    Alcoholism Father    Social History   Socioeconomic History   Marital status: Legally Separated    Spouse name: Not on file   Number of children: 2   Years of  education: 12   Highest education level: 12th grade  Occupational History    Comment: Retired  Tobacco Use   Smoking status: Former   Smokeless tobacco: Never  Building services engineer Use: Never used  Substance and Sexual Activity   Alcohol use: Yes    Alcohol/week: 5.0 standard drinks of alcohol    Types: 5 Glasses of wine per week   Drug use: No   Sexual activity: Not Currently  Other Topics Concern   Not on file  Social History Narrative   Lives alone. She has two children, one does live close by. She enjoys doing yard work in her free time.   Social Determinants of Health   Financial Resource Strain: Low Risk  (11/17/2022)   Overall Financial Resource Strain (CARDIA)    Difficulty of Paying Living Expenses: Not hard at all  Food Insecurity: No Food Insecurity (11/17/2022)   Hunger Vital Sign    Worried About Running Out of Food in the Last Year: Never true    Ran Out of Food in the Last Year: Never true  Transportation Needs: No Transportation Needs (11/17/2022)   PRAPARE - Administrator, Civil Service (Medical): No    Lack of Transportation (Non-Medical): No  Physical Activity: Insufficiently Active (11/17/2022)   Exercise Vital Sign    Days of Exercise per Week: 4 days    Minutes of Exercise per Session: 30 min  Stress: No Stress Concern Present (11/17/2022)   [employer] of Occupational Health - Occupational Stress Questionnaire    Feeling of Stress : Only a little  Social Connections: Moderately Isolated (11/17/2022)   Social Connection and Isolation Panel [NHANES]    Frequency of Communication with Friends and Family: More than three times a week    Frequency of Social Gatherings with Friends and Family: Three times a week    Attends Religious Services: More than 4 times per year    Active Member of Clubs or Organizations: No    Attends Banker Meetings: Never    Marital Status: Separated    Activities of Daily Living    11/17/2022   10:17 AM   In your present state of health, do you have any difficulty performing the following activities:  Hearing? 0  Vision? 0  Difficulty concentrating or making decisions? 0  Walking or climbing stairs? 0  Dressing or bathing? 0  Doing errands, shopping? 0  Preparing Food and eating ? N  Using the Toilet? N  In the past six months, have you accidently leaked urine? N  Do you have problems with loss of bowel control? N  Managing your Medications? N  Managing your Finances? N  Housekeeping or managing your Housekeeping? N    Patient Education/ Literacy How  often do you need to have someone help you when you read instructions, pamphlets, or other written materials from your doctor or pharmacy?: 1 - Never What is the last grade level you completed in school?: 12th grade  Exercise    Diet Patient reports consuming 2 meals a day and 1 snack(s) a day Patient reports that her primary diet is: Regular Patient reports that she does have regular access to food.   Depression Screen    11/17/2022   10:16 AM 09/01/2022    9:01 AM 07/14/2022   11:14 AM 11/11/2021   11:03 AM 08/27/2021    9:09 AM 10/29/2020    9:07 AM 08/16/2020   10:59 AM  PHQ 2/9 Scores  PHQ - 2 Score 1 2 3  0 0 0 2  PHQ- 9 Score  4 4    5      Fall Risk    11/17/2022   10:16 AM 07/14/2022   11:14 AM 06/11/2022   11:15 AM 11/11/2021   11:03 AM 08/27/2021    9:09 AM  Fall Risk   Falls in the past year? 0 0 0 0 0  Number falls in past yr: 0 0 0 0 0  Injury with Fall? 0 0 0 0 0  Risk for fall due to : No Fall Risks No Fall Risks No Fall Risks No Fall Risks No Fall Risks  Follow up Falls evaluation completed Falls evaluation completed Falls evaluation completed Falls evaluation completed Falls prevention discussed;Falls evaluation completed     Objective:  DEYONNA VIEHMAN seemed alert and oriented and she participated appropriately during our telephone visit.  Blood Pressure Weight BMI  BP Readings from Last 3 Encounters:   09/01/22 132/74  07/14/22 126/73  06/11/22 121/76   Wt Readings from Last 3 Encounters:  09/01/22 148 lb (67.1 kg)  07/14/22 146 lb 11.2 oz (66.5 kg)  06/11/22 147 lb 3.2 oz (66.8 kg)   BMI Readings from Last 1 Encounters:  09/01/22 27.07 kg/m    *Unable to obtain current vital signs, weight, and BMI due to telephone visit type  Hearing/Vision  Victoria Benjamin did not seem to have difficulty with hearing/understanding during the telephone conversation Reports that she has not had a formal eye exam by an eye care professional within the past year Reports that she has not had a formal hearing evaluation within the past year *Unable to fully assess hearing and vision during telephone visit type  Cognitive Function:    11/17/2022   10:22 AM 11/11/2021   11:08 AM 10/29/2020    9:15 AM  6CIT Screen  What Year? 0 points 0 points 0 points  What month? 0 points 0 points 0 points  What time? 0 points 0 points 0 points  Count back from 20 0 points 0 points 0 points  Months in reverse 0 points 2 points 0 points  Repeat phrase 0 points 0 points 0 points  Total Score 0 points 2 points 0 points   (Normal:0-7, Significant for Dysfunction: >8)  Normal Cognitive Function Screening: Yes   Immunization & Health Maintenance Record Immunization History  Administered Date(s) Administered   COVID-19, mRNA, vaccine(Comirnaty)12 years and older 04/23/2022   Fluad Quad(high Dose 65+) 02/07/2020, 02/15/2021, 02/05/2022   Influenza Split 02/20/2011, 02/05/2012   Influenza Whole 03/31/2007, 02/24/2008, 05/18/2009, 02/21/2010   Influenza, High Dose Seasonal PF 01/22/2017, 02/10/2019   Influenza,inj,Quad PF,6+ Mos 02/02/2013, 02/23/2014, 12/27/2014, 02/28/2016, 01/21/2018   PFIZER(Purple Top)SARS-COV-2 Vaccination 06/07/2019, 06/28/2019, 02/29/2020   Pneumococcal Conjugate-13  12/27/2014   Pneumococcal Polysaccharide-23 02/02/2013   Respiratory Syncytial Virus Vaccine,Recomb Aduvanted(Arexvy) 04/23/2022   Td  08/30/2008   Tdap 11/14/2021   Zoster Recombinant(Shingrix) 02/29/2020, 11/14/2021    Health Maintenance  Topic Date Due   INFLUENZA VACCINE  12/11/2022   Medicare Annual Wellness (AWV)  11/17/2023   DTaP/Tdap/Td (3 - Td or Tdap) 11/15/2031   Pneumonia Vaccine 69+ Years old  Completed   DEXA SCAN  Completed   COVID-19 Vaccine  Completed   Hepatitis C Screening  Completed   Zoster Vaccines- Shingrix  Completed   HPV VACCINES  Aged Out   Colonoscopy  Discontinued   Fecal DNA (Cologuard)  Discontinued       Assessment  This is a routine wellness examination for Victoria Benjamin.  Health Maintenance: Due or Overdue There are no preventive care reminders to display for this patient.   Victoria Benjamin does not need a referral for [employer] Assistance: Care Management:   no Social Work:    no Prescription Assistance:  no Nutrition/Diabetes Education:  no   Plan:  Personalized Goals  Goals Addressed               This Visit's Progress     Patient Stated (pt-stated)        Patient stated that she would like to loose weight.       Personalized Health Maintenance & Screening Recommendations  Bone densitometry screening  Lung Cancer Screening Recommended: no (Low Dose CT Chest recommended if Age 50-80 years, 20 pack-year currently smoking OR have quit w/in past 15 years) Hepatitis C Screening recommended: no HIV Screening recommended: no  Advanced Directives: Written information was not prepared per patient's request.  Referrals & Orders No orders of the defined types were placed in this encounter.   Follow-up Plan Follow-up with Agapito Games, MD as planned Medicare wellness visit in one year.  AVS printed and mailed to the patient.   I have personally reviewed and noted the following in the patient's chart:   Medical and social history Use of alcohol, tobacco or illicit drugs  Current medications and supplements Functional ability and  status Nutritional status Physical activity Advanced directives List of other physicians Hospitalizations, surgeries, and ER visits in previous 12 months Vitals Screenings to include cognitive, depression, and falls Referrals and appointments  In addition, I have reviewed and discussed with Victoria Benjamin certain preventive protocols, quality metrics, and best practice recommendations. A written personalized care plan for preventive services as well as general preventive health recommendations is available and can be mailed to the patient at her request.      Modesto Charon, RN BSN  11/17/2022

## 2022-12-25 ENCOUNTER — Ambulatory Visit (INDEPENDENT_AMBULATORY_CARE_PROVIDER_SITE_OTHER): Payer: Medicare Other | Admitting: Family Medicine

## 2022-12-25 ENCOUNTER — Encounter: Payer: Self-pay | Admitting: Family Medicine

## 2022-12-25 VITALS — BP 144/82 | HR 73 | Ht 62.0 in | Wt 151.0 lb

## 2022-12-25 DIAGNOSIS — M25472 Effusion, left ankle: Secondary | ICD-10-CM | POA: Diagnosis not present

## 2022-12-25 DIAGNOSIS — Z23 Encounter for immunization: Secondary | ICD-10-CM | POA: Diagnosis not present

## 2022-12-25 NOTE — Progress Notes (Signed)
   Established Patient Office Visit  Subjective   Patient ID: Victoria Benjamin, female    DOB: 09/09/1945  Age: 77 y.o. MRN: [id number]  Chief Complaint  Patient presents with   Edema    HPI  Swelling in L foot and tingling/numbness in L hand.   She reports that she did do a lot of standing yesterday. Her Left foot has decrease in swelling since this morning however, she does still have some swelling in her ankle. Denies any pain.    The tingling/numbness in her hand she feels may be related to carpal tunnel it comes and goes.  She spent all day yesterday standing at the kitchen table pinning cloth to sew. She doesn't usually stand that long.  She also says she has never had swelling like this before.  She denies any recent increase salt intake.  The swelling was a little better this morning when she got up.  Denies any pain in that leg.    ROS    Objective:     BP (!) 144/82   Pulse 73   Ht 5\' 2"  (1.575 m)   Wt 151 lb (68.5 kg)   SpO2 96%   BMI 27.62 kg/m    Physical Exam Vitals and nursing note reviewed.  Constitutional:      Appearance: She is well-developed.  HENT:     Head: Normocephalic and atraumatic.  Cardiovascular:     Rate and Rhythm: Normal rate and regular rhythm.     Heart sounds: Normal heart sounds.  Pulmonary:     Effort: Pulmonary effort is normal.     Breath sounds: Normal breath sounds.  Musculoskeletal:     Comments: Dorsal pedal pulse and posterior tibial pulse 2+ bilaterally on the left foot.  Good color of the skin.  Nontender around the ankle and metatarsals.  Nontender over the calf muscle.  She has some trace swelling up to the mid tibia on the left no swelling on the right.  Skin:    General: Skin is warm and dry.  Neurological:     Mental Status: She is alert and oriented to person, place, and time.  Psychiatric:        Behavior: Behavior normal.      No results found for any visits on 12/25/22.    The 10-year ASCVD risk  score (Arnett DK, et al., 2019) is: 21.5%    Assessment & Plan:   Problem List Items Addressed This Visit   None Visit Diagnoses     Left ankle swelling    -  Primary      Acute left lower leg and ankle swelling-no evidence of DVT and no risk factors for DVT.  Did encourage her to keep the leg elevated for 15 to 20 minutes a couple times a day if possible.  Again she reports that the swelling is actually little better today.  Call if any worsening onset or pain in the leg, dusky appearance of the foot or if just not improving after the weekend.  If that is the case then we will probably get some additional labs and consider a Doppler.  But again she is very low risk for DVT at this point.  Encouraged her to avoid excess salt.  The joint is nontender and there was no trauma.  No follow-ups on file.    Nani Gasser, MD

## 2022-12-25 NOTE — Progress Notes (Signed)
Swelling in L foot and tingling/numbness in L hand.  She reports that she did do a lot of standing yesterday. Her Left foot has decrease in swelling since this morning however, she does still have some swelling in her ankle. Denies any pain.   The tingling/numbness in her hand she feels may be related to carpal tunnel it comes and goes.

## 2023-03-03 ENCOUNTER — Encounter: Payer: Self-pay | Admitting: Family Medicine

## 2023-03-03 ENCOUNTER — Ambulatory Visit (INDEPENDENT_AMBULATORY_CARE_PROVIDER_SITE_OTHER): Payer: Medicare Other | Admitting: Family Medicine

## 2023-03-03 ENCOUNTER — Ambulatory Visit: Payer: Medicare Other

## 2023-03-03 VITALS — BP 127/71 | HR 78 | Ht 62.0 in | Wt 148.0 lb

## 2023-03-03 DIAGNOSIS — J449 Chronic obstructive pulmonary disease, unspecified: Secondary | ICD-10-CM | POA: Diagnosis not present

## 2023-03-03 DIAGNOSIS — G8929 Other chronic pain: Secondary | ICD-10-CM | POA: Diagnosis not present

## 2023-03-03 DIAGNOSIS — R7301 Impaired fasting glucose: Secondary | ICD-10-CM

## 2023-03-03 DIAGNOSIS — K2100 Gastro-esophageal reflux disease with esophagitis, without bleeding: Secondary | ICD-10-CM | POA: Diagnosis not present

## 2023-03-03 DIAGNOSIS — M8580 Other specified disorders of bone density and structure, unspecified site: Secondary | ICD-10-CM

## 2023-03-03 DIAGNOSIS — M160 Bilateral primary osteoarthritis of hip: Secondary | ICD-10-CM | POA: Diagnosis not present

## 2023-03-03 DIAGNOSIS — M47816 Spondylosis without myelopathy or radiculopathy, lumbar region: Secondary | ICD-10-CM | POA: Diagnosis not present

## 2023-03-03 DIAGNOSIS — M545 Low back pain, unspecified: Secondary | ICD-10-CM | POA: Diagnosis not present

## 2023-03-03 DIAGNOSIS — M25551 Pain in right hip: Secondary | ICD-10-CM

## 2023-03-03 DIAGNOSIS — M5136 Other intervertebral disc degeneration, lumbar region with discogenic back pain only: Secondary | ICD-10-CM | POA: Diagnosis not present

## 2023-03-03 DIAGNOSIS — I700 Atherosclerosis of aorta: Secondary | ICD-10-CM | POA: Diagnosis not present

## 2023-03-03 LAB — POCT GLYCOSYLATED HEMOGLOBIN (HGB A1C): Hemoglobin A1C: 5.9 % — AB (ref 4.0–5.6)

## 2023-03-03 IMAGING — DX DG CHEST 2V
2 series · 2 of 2 positions shown · non-contrast
Comparison: 06/29/2003

CLINICAL DATA: Chronic shortness of breath.  COPD.

EXAM:
CHEST - 2 VIEW

[chest pa]
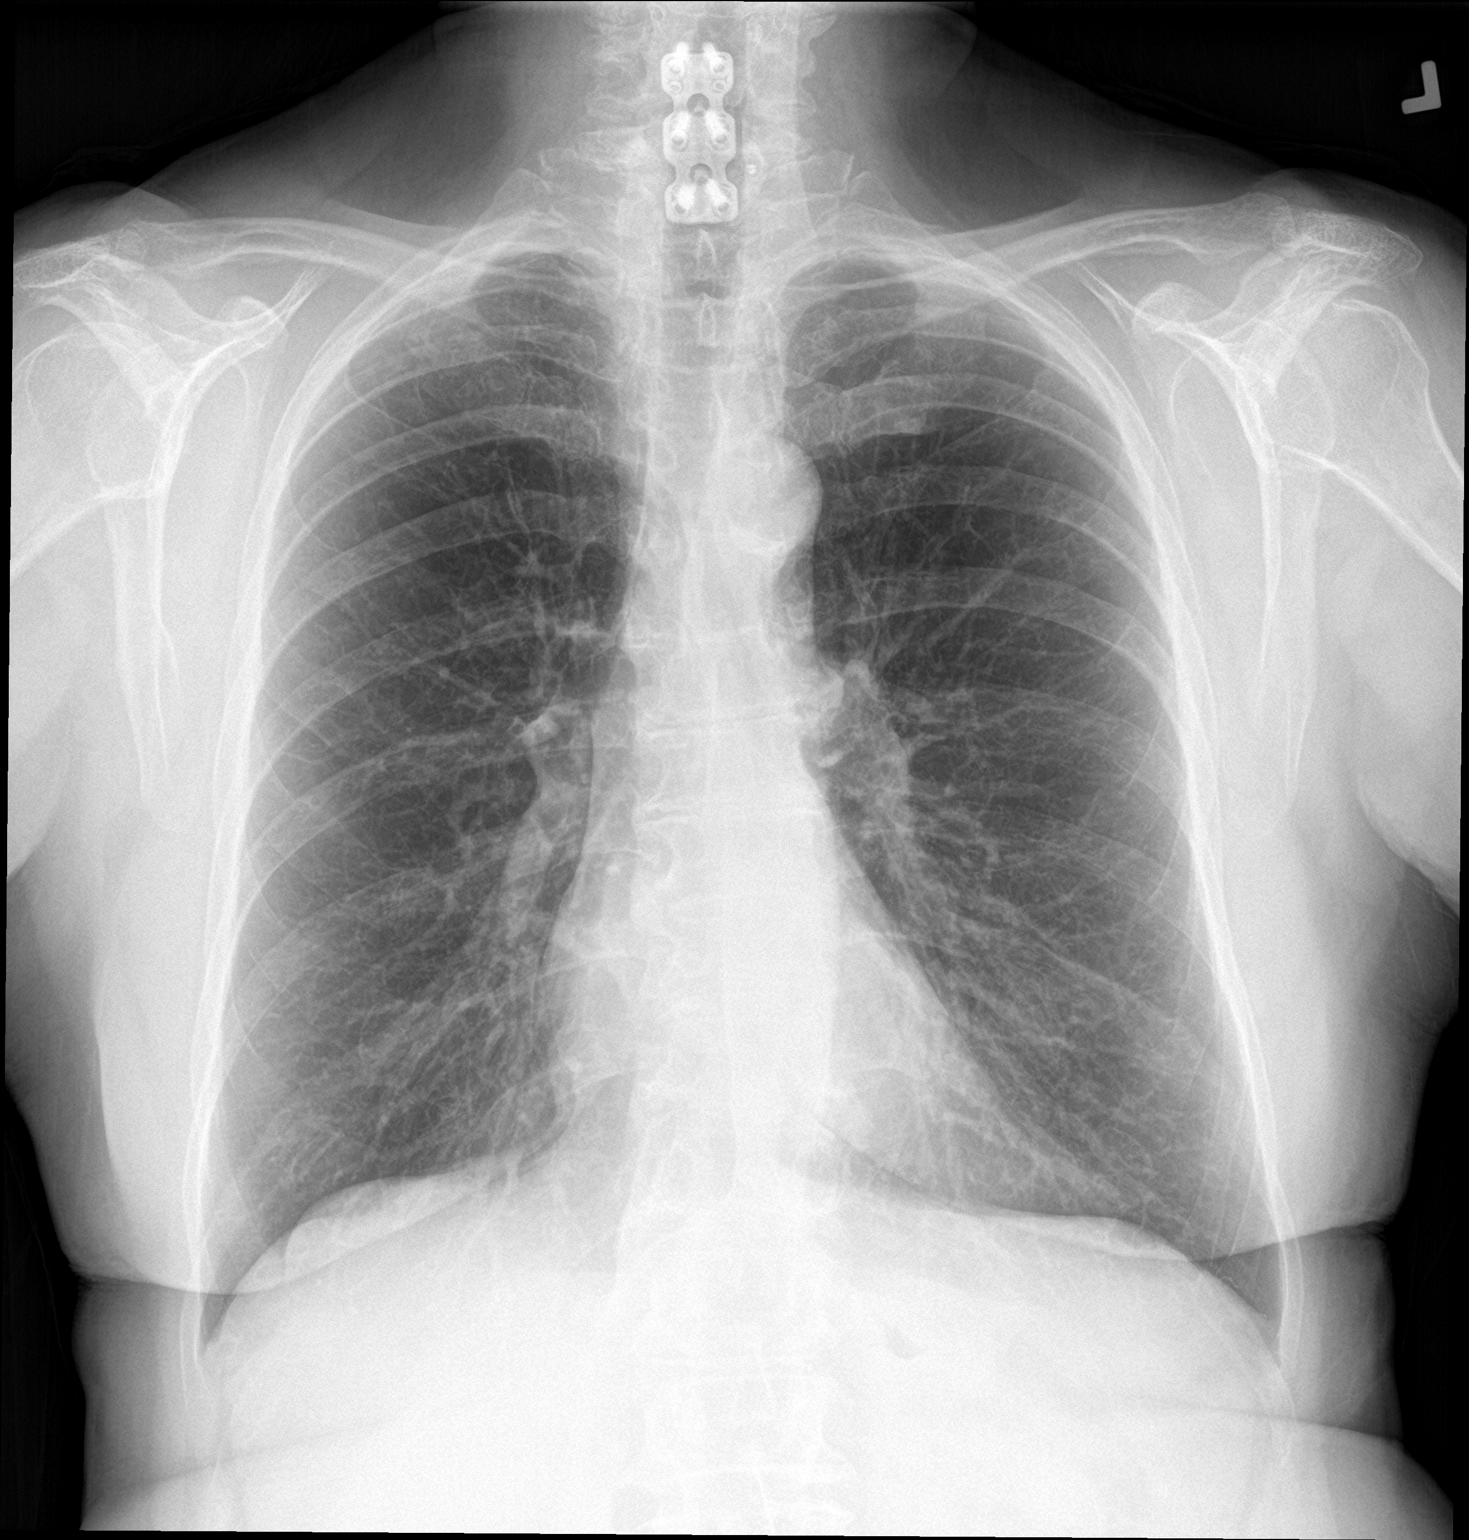

[chest lat]
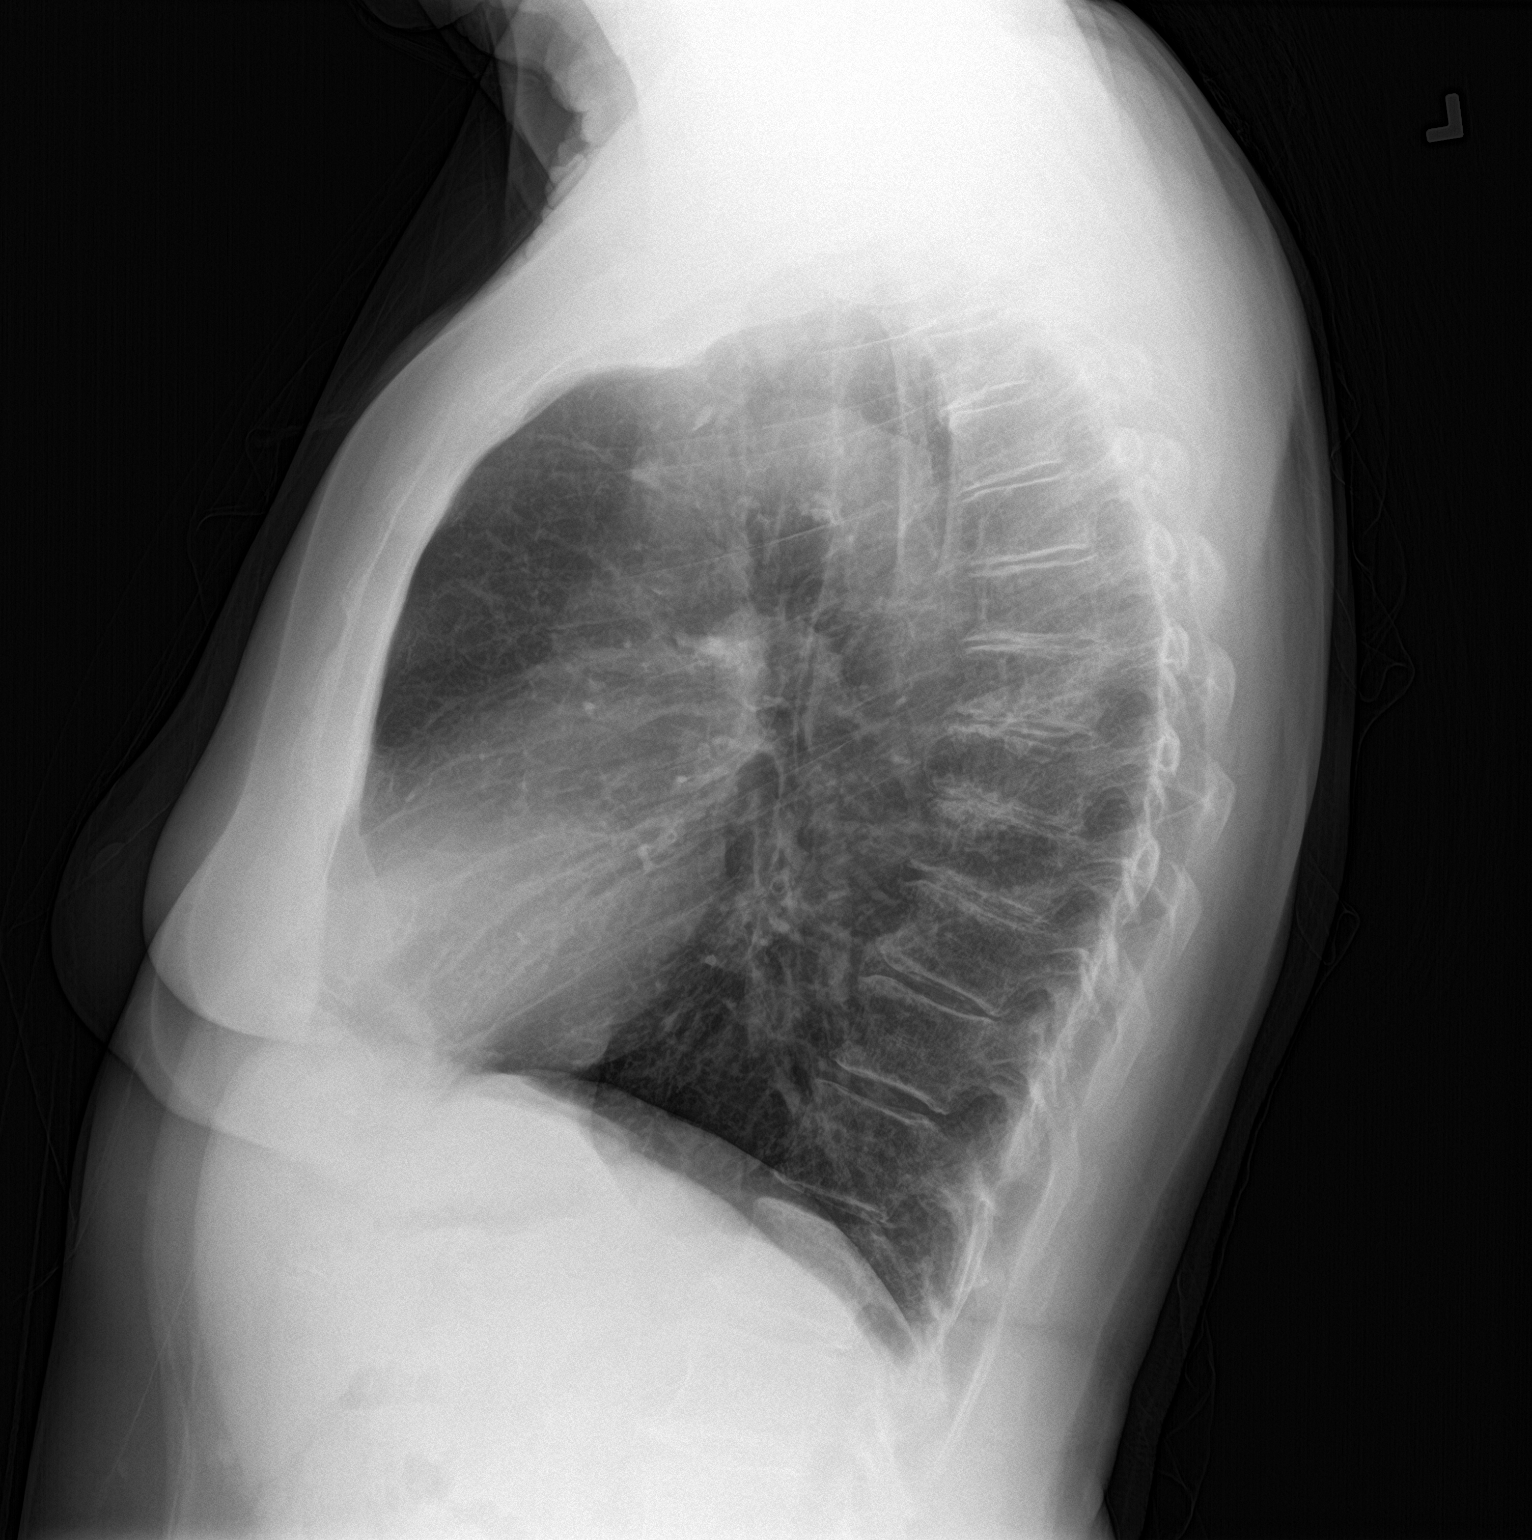

[2 of 2 positions shown; findings below may reference images not displayed]

FINDINGS: The heart size and mediastinal contours are within normal limits.
Slightly hyperinflated lungs with mildly coarsened interstitial
markings. No focal airspace consolidation, pleural effusion, or
pneumothorax. The visualized skeletal structures are unremarkable.
IMPRESSION: 1. No active cardiopulmonary disease.
2. COPD.

## 2023-03-03 NOTE — Assessment & Plan Note (Addendum)
Stable no flares. Schedule spirometry in the spring. Continue spiriva.  Can you albuterol as needed.

## 2023-03-03 NOTE — Progress Notes (Signed)
   Established Patient Office Visit  Subjective   Patient ID: Victoria Benjamin, female    DOB: November 24, 1945  Age: 77 y.o. MRN: [id number]  Chief Complaint  Patient presents with   COPD   ifg    HPI  Impaired fasting glucose-no increased thirst or urination. No symptoms consistent with hypoglycemia.  F/U COPD -currently on Spiriva daily and then albuterol as needed. She has been doing well with no flares or exacerbations.    She has lost 5 lbs.    Still struggling with hip pain.  She says its painful to sleep on that side and sometimes it can be bothersome with walking as well.  Has to stretch the hip out.     ROS    Objective:     BP 127/71   Pulse 78   Ht 5\' 2"  (1.575 m)   Wt 148 lb (67.1 kg)   SpO2 98%   BMI 27.07 kg/m    Physical Exam Vitals and nursing note reviewed.  Constitutional:      Appearance: Normal appearance.  HENT:     Head: Normocephalic and atraumatic.  Eyes:     Conjunctiva/sclera: Conjunctivae normal.  Cardiovascular:     Rate and Rhythm: Normal rate and regular rhythm.  Pulmonary:     Effort: Pulmonary effort is normal.     Breath sounds: Normal breath sounds.  Skin:    General: Skin is warm and dry.  Neurological:     Mental Status: She is alert.  Psychiatric:        Mood and Affect: Mood normal.      Results for orders placed or performed in visit on 03/03/23  POCT HgB A1C  Result Value Ref Range   Hemoglobin A1C 5.9 (A) 4.0 - 5.6 %   HbA1c POC (<> result, manual entry)     HbA1c, POC (prediabetic range)     HbA1c, POC (controlled diabetic range)        The 10-year ASCVD risk score (Arnett DK, et al., 2019) is: 17.2%    Assessment & Plan:   Problem List Items Addressed This Visit       Respiratory   Chronic obstructive pulmonary disease (HCC)    Stable no flares. Schedule spirometry in the spring. Continue spiriva.  Can you albuterol as needed.        Digestive   Gastroesophageal reflux disease with  esophagitis without hemorrhage    Uses her Prilosec daily.        Endocrine   IFG (impaired fasting glucose) - Primary    A1C is stable at 5.3. continue current regimen.       Relevant Orders   POCT HgB A1C (Completed)     Musculoskeletal and Integument   Osteopenia    Due for repeat DEXA. She does take calcium with vitamin D.       Relevant Orders   DG Bone Density   Other Visit Diagnoses     Right hip pain       Relevant Orders   DG Hip Unilat W OR W/O Pelvis 2-3 Views Right   Chronic right-sided low back pain, unspecified whether sciatica present       Relevant Orders   DG Lumbar Spine Complete       Return in about 6 months (around 09/01/2023) for spirometry .    Nani Gasser, MD

## 2023-03-03 NOTE — Assessment & Plan Note (Signed)
Uses her Prilosec daily.

## 2023-03-03 NOTE — Assessment & Plan Note (Signed)
Due for repeat DEXA. She does take calcium with vitamin D.

## 2023-03-03 NOTE — Assessment & Plan Note (Signed)
A1C is stable at 5.3. continue current regimen.

## 2023-03-11 ENCOUNTER — Ambulatory Visit: Payer: Medicare Other

## 2023-03-11 ENCOUNTER — Telehealth: Payer: Self-pay | Admitting: Family Medicine

## 2023-03-11 DIAGNOSIS — M8580 Other specified disorders of bone density and structure, unspecified site: Secondary | ICD-10-CM

## 2023-03-11 DIAGNOSIS — M85852 Other specified disorders of bone density and structure, left thigh: Secondary | ICD-10-CM | POA: Diagnosis not present

## 2023-03-11 DIAGNOSIS — Z780 Asymptomatic menopausal state: Secondary | ICD-10-CM | POA: Diagnosis not present

## 2023-03-11 NOTE — Telephone Encounter (Signed)
Patient called in looking for Xray results

## 2023-03-11 NOTE — Progress Notes (Signed)
Hi Victoria Benjamin, your bone density test shows a T-score of -1.8 consistent with low bone mass or osteopenia.   The current recommendation for osteopenia (mildly thin bones) treatment includes:   #1 calcium-total of 1200 mg of calcium daily.  If you eat a very calcium rich diet you may be able to obtain that without a supplement.  If not, then I recommend calcium 500 mg twice a day.  There are several products over-the-counter such as Caltrate D and Viactiv chews which are great options that contain calcium and vitamin D. #2 vitamin D-recommend 800 international units daily. #3 exercise-recommend 30 minutes of weightbearing exercise 3 days a week.  Resistance training ,such as doing bands and light weights, can be particularly helpful.

## 2023-03-26 NOTE — Progress Notes (Signed)
Hi Victoria Benjamin you do have some mild arthritis in both hips.  Please see the report for your spine.

## 2023-03-26 NOTE — Progress Notes (Signed)
Hi Victoria Benjamin, your xray shows degenerative disks at multiple levels.  He also have some arthritis at the facet joints those of the hinges of the spine.  How would you feel about doing formal physical therapy for your back and your hip to see if this is helpful over the next several weeks.  And if not improving then we can always get you in with sports med/Ortho for further workup.  If you are okay with this please let us know we have great PT department here but if there is something closer to your home that would be more convenient please let us know.

## 2023-04-09 ENCOUNTER — Other Ambulatory Visit: Payer: Self-pay | Admitting: Family Medicine

## 2023-04-09 DIAGNOSIS — E785 Hyperlipidemia, unspecified: Secondary | ICD-10-CM

## 2023-04-12 NOTE — Telephone Encounter (Signed)
Pt has been informed.

## 2023-04-21 ENCOUNTER — Ambulatory Visit (INDEPENDENT_AMBULATORY_CARE_PROVIDER_SITE_OTHER): Payer: Medicare Other | Admitting: Sports Medicine

## 2023-04-21 ENCOUNTER — Ambulatory Visit: Payer: Medicare Other

## 2023-04-21 ENCOUNTER — Encounter: Payer: Self-pay | Admitting: Sports Medicine

## 2023-04-21 ENCOUNTER — Other Ambulatory Visit (INDEPENDENT_AMBULATORY_CARE_PROVIDER_SITE_OTHER): Payer: Medicare Other

## 2023-04-21 ENCOUNTER — Telehealth: Payer: Self-pay | Admitting: Sports Medicine

## 2023-04-21 DIAGNOSIS — M170 Bilateral primary osteoarthritis of knee: Secondary | ICD-10-CM | POA: Diagnosis not present

## 2023-04-21 MED ORDER — TRIAMCINOLONE ACETONIDE 40 MG/ML IJ SUSP
40.0000 mg | Freq: Once | INTRAMUSCULAR | Status: AC
  Administered 2023-04-21: 40 mg via INTRAMUSCULAR

## 2023-04-21 NOTE — Progress Notes (Signed)
    Procedures performed today:    Procedure: Real-time Ultrasound Guided injection of the right knee Device: Samsung HS60  Verbal informed consent obtained.  Time-out conducted.  Noted no overlying erythema, induration, or other signs of local infection.  Skin prepped in a sterile fashion.  Local anesthesia: Topical Ethyl chloride.  With sterile technique and under real time ultrasound guidance: Effusion noted, 1 cc Kenalog 40, 2 cc lidocaine, 2 cc bupivacaine injected easily Completed without difficulty  Advised to call if fevers/chills, erythema, induration, drainage, or persistent bleeding.  Images permanently stored and available for review in PACS.  Impression: Technically successful ultrasound guided injection.  Independent interpretation of notes and tests performed by another provider:   None.  Brief History, Exam, Impression, and Recommendations:    Primary osteoarthritis of both knees This is a very pleasant 77 year old female, she has known bilateral knee osteoarthritis, we last did a series of Orthovisc 4 years ago and she did well until recently, right knee is starting to hurt again, left knee is okay. Steroid injection today into the right knee, updated x-rays and we will get her approved again for bilateral Orthovisc.    ____________________________________________ Ihor Austin. Benjamin Stain, M.D., ABFM., CAQSM., AME. Primary Care and Sports Medicine [facility]  Adjunct Professor of Family Medicine  [place] [employer]  Restaurant manager, fast food

## 2023-04-21 NOTE — Telephone Encounter (Signed)
[facility] approval please, bilateral, worked great for years ago

## 2023-04-21 NOTE — Assessment & Plan Note (Signed)
This is a very pleasant 77 year old female, she has known bilateral knee osteoarthritis, we last did a series of Orthovisc 4 years ago and she did well until recently, right knee is starting to hurt again, left knee is okay. Steroid injection today into the right knee, updated x-rays and we will get her approved again for bilateral Orthovisc.

## 2023-04-23 DIAGNOSIS — L570 Actinic keratosis: Secondary | ICD-10-CM | POA: Diagnosis not present

## 2023-04-23 DIAGNOSIS — D485 Neoplasm of uncertain behavior of skin: Secondary | ICD-10-CM | POA: Diagnosis not present

## 2023-04-23 DIAGNOSIS — L821 Other seborrheic keratosis: Secondary | ICD-10-CM | POA: Diagnosis not present

## 2023-04-23 DIAGNOSIS — L820 Inflamed seborrheic keratosis: Secondary | ICD-10-CM | POA: Diagnosis not present

## 2023-04-23 DIAGNOSIS — L718 Other rosacea: Secondary | ICD-10-CM | POA: Diagnosis not present

## 2023-04-23 DIAGNOSIS — Z85828 Personal history of other malignant neoplasm of skin: Secondary | ICD-10-CM | POA: Diagnosis not present

## 2023-04-29 ENCOUNTER — Telehealth: Payer: Medicare Other | Admitting: Family Medicine

## 2023-04-29 ENCOUNTER — Encounter: Payer: Self-pay | Admitting: Family Medicine

## 2023-04-29 DIAGNOSIS — U071 COVID-19: Secondary | ICD-10-CM

## 2023-04-29 MED ORDER — NIRMATRELVIR/RITONAVIR (PAXLOVID)TABLET: 3.0000 | ORAL_TABLET | Freq: Two times a day (BID) | ORAL | 0 refills | Status: AC

## 2023-04-29 NOTE — Progress Notes (Signed)
    Virtual Visit via Video Note  I connected with Audelia Hives on 04/29/23 at 10:50 AM EST by a video enabled telemedicine application and verified that I am speaking with the correct person using two identifiers.   I discussed the limitations of evaluation and management by telemedicine and the availability of in person appointments. The patient expressed understanding and agreed to proceed.  Patient location: at home Provider location: in office  Subjective:    CC:  No chief complaint on file.   HPI:  That started yesterday with a runny nose and some mild nasal congestion..  No fever or chills she has not had any respiratory symptoms yet.  He had she says she tested for COVID this morning and it was positive.  No GI symptoms.  She has been using a little bit of Tylenol.   Past medical history, Surgical history, Family history not pertinant except as noted below, Social history, Allergies, and medications have been entered into the medical record, reviewed, and corrections made.    Objective:    General: Speaking clearly in complete sentences without any shortness of breath.  Alert and oriented x3.  Normal judgment. No apparent acute distress.    Impression and Recommendations:    Problem List Items Addressed This Visit   None Visit Diagnoses       COVID-19 virus infection    -  Primary   Relevant Medications   nirmatrelvir/ritonavir (PAXLOVID) 20 x 150 MG & 10 x 100MG  TABS      COVID 19-we discussed options right now she is actually having mild symptoms she is over 58 years and age she has taken Paxlovid in the past and did well with it without any side effects she has completely normal renal function so we discussed maybe doing the Paxlovid again especially since we are heading into the Christmas holidays and she wants to make sure that she feels better so that she can go to celebrations next week.  Encouraged to  continue hygiene precautions.  No orders of the  defined types were placed in this encounter.   Meds ordered this encounter  Medications   nirmatrelvir/ritonavir (PAXLOVID) 20 x 150 MG & 10 x 100MG  TABS    Sig: Take 3 tablets by mouth 2 (two) times daily for 5 days. (Take nirmatrelvir 150 mg two tablets twice daily for 5 days and ritonavir 100 mg one tablet twice daily for 5 days) Patient GFR is 81    Dispense:  30 tablet    Refill:  0     I discussed the assessment and treatment plan with the patient. The patient was provided an opportunity to ask questions and all were answered. The patient agreed with the plan and demonstrated an understanding of the instructions.   The patient was advised to call back or seek an in-person evaluation if the symptoms worsen or if the condition fails to improve as anticipated.    Nani Gasser, MD

## 2023-04-29 NOTE — Telephone Encounter (Signed)
PA information submitted via [email or web address] for Orthovisc Paperwork has been printed and given to Dr. T for signatures. Once obtained, information will be faxed to [employer] at [phone]  

## 2023-06-09 ENCOUNTER — Telehealth: Payer: Self-pay

## 2023-06-09 NOTE — Telephone Encounter (Signed)
Patient informed and will contact us if and when she decides to do the injections.

## 2023-06-09 NOTE — Telephone Encounter (Signed)
Copied from CRM [id number]. Topic: Clinical - Medical Advice >> Jun 09, 2023  9:19 AM Victoria Benjamin wrote: Reason for CRM: Patient returning call regarding knee injections. Patient states she is unsure and is perturbed by the copayment of each visit being so high. Patient states needs time to decide.

## 2023-06-09 NOTE — Telephone Encounter (Signed)
Looking at the notes, $160 as copayment for each Ortho this shot is actually a pretty good amount considering they actually cost $600-$700 each.  We will standby and she can make the appointment if and when she is ready.

## 2023-07-14 ENCOUNTER — Other Ambulatory Visit: Payer: Self-pay | Admitting: Family Medicine

## 2023-07-14 DIAGNOSIS — Z1231 Encounter for screening mammogram for malignant neoplasm of breast: Secondary | ICD-10-CM

## 2023-07-30 ENCOUNTER — Ambulatory Visit

## 2023-07-30 DIAGNOSIS — Z1231 Encounter for screening mammogram for malignant neoplasm of breast: Secondary | ICD-10-CM | POA: Diagnosis not present

## 2023-08-01 ENCOUNTER — Other Ambulatory Visit: Payer: Self-pay | Admitting: Family Medicine

## 2023-08-01 DIAGNOSIS — K2100 Gastro-esophageal reflux disease with esophagitis, without bleeding: Secondary | ICD-10-CM

## 2023-08-03 ENCOUNTER — Encounter: Payer: Self-pay | Admitting: Family Medicine

## 2023-08-03 NOTE — Progress Notes (Signed)
 Please call patient. Normal mammogram.  Repeat in 1 year.

## 2023-09-01 ENCOUNTER — Ambulatory Visit (INDEPENDENT_AMBULATORY_CARE_PROVIDER_SITE_OTHER): Payer: Medicare Other | Admitting: Family Medicine

## 2023-09-01 ENCOUNTER — Ambulatory Visit: Payer: Medicare Other | Admitting: Family Medicine

## 2023-09-01 ENCOUNTER — Encounter: Payer: Self-pay | Admitting: Family Medicine

## 2023-09-01 VITALS — BP 118/73 | HR 77 | Ht 62.0 in | Wt 148.0 lb

## 2023-09-01 DIAGNOSIS — R7301 Impaired fasting glucose: Secondary | ICD-10-CM | POA: Diagnosis not present

## 2023-09-01 DIAGNOSIS — E785 Hyperlipidemia, unspecified: Secondary | ICD-10-CM

## 2023-09-01 DIAGNOSIS — G5602 Carpal tunnel syndrome, left upper limb: Secondary | ICD-10-CM | POA: Diagnosis not present

## 2023-09-01 DIAGNOSIS — J449 Chronic obstructive pulmonary disease, unspecified: Secondary | ICD-10-CM | POA: Diagnosis not present

## 2023-09-01 LAB — POCT GLYCOSYLATED HEMOGLOBIN (HGB A1C): Hemoglobin A1C: 5.9 % — AB (ref 4.0–5.6)

## 2023-09-01 NOTE — Assessment & Plan Note (Addendum)
 Reschedule spirometry. Using albuterol  occ but /w doing well.

## 2023-09-01 NOTE — Assessment & Plan Note (Signed)
 Due for updated lipid panel.  Tolerating rosuvastatin  10 mg well.

## 2023-09-01 NOTE — Progress Notes (Signed)
   Established Patient Office Visit  Subjective  Patient ID: Victoria Benjamin, female    DOB: December 29, 1945  Age: 78 y.o. MRN: [id number]  Chief Complaint  Patient presents with   ifg    HPI  Follow-up COPD-we originally had her on the schedule to do spirometry today but with the pollen she felt like she was not breathing as well and she had actually used her albuterol  this morning so we will plan to reschedule that.  Impaired fasting glucose-no increased thirst or urination. No symptoms consistent with hypoglycemia.  She also c/o of numbness and tingling in her left hand (all fingers) at night and when driving for about a year.       ROS    Objective:     BP 118/73   Pulse 77   Ht 5\' 2"  (1.575 m)   Wt 148 lb (67.1 kg)   SpO2 96%   BMI 27.07 kg/m    Physical Exam Vitals and nursing note reviewed.  Constitutional:      Appearance: Normal appearance.  HENT:     Head: Normocephalic and atraumatic.  Eyes:     Conjunctiva/sclera: Conjunctivae normal.  Cardiovascular:     Rate and Rhythm: Normal rate and regular rhythm.  Pulmonary:     Effort: Pulmonary effort is normal.     Breath sounds: Normal breath sounds.  Musculoskeletal:     Comments: Left wrist is nontender, NROM. Neg  tinnel's.  Pos phalens.   Skin:    General: Skin is warm and dry.  Neurological:     Mental Status: She is alert.  Psychiatric:        Mood and Affect: Mood normal.    Results for orders placed or performed in visit on 09/01/23  POCT HgB A1C  Result Value Ref Range   Hemoglobin A1C 5.9 (A) 4.0 - 5.6 %   HbA1c POC (<> result, manual entry)     HbA1c, POC (prediabetic range)     HbA1c, POC (controlled diabetic range)        The 10-year ASCVD risk score (Arnett DK, et al., 2019) is: 16.8%    Assessment & Plan:   Problem List Items Addressed This Visit       Respiratory   Chronic obstructive pulmonary disease (HCC)   Reschedule spirometry. Using albuterol  occ but /w doing  well.       Relevant Orders   CMP14+EGFR   Lipid panel   CBC     Endocrine   IFG (impaired fasting glucose) - Primary   A1C is 5.9 today. Continue current regimen.  F/U in 6 months.        Relevant Orders   POCT HgB A1C (Completed)   CMP14+EGFR   Lipid panel   CBC     Other   Hyperlipidemia   Due for updated lipid panel.  Tolerating rosuvastatin  10 mg well.      Relevant Orders   CMP14+EGFR   Lipid panel   CBC   Other Visit Diagnoses       Carpal tunnel syndrome on left          Carpel tunnel, left - will treat with cockup splint at night.  F/U in 1 month if not improving.    Return in about 6 months (around 03/02/2024) for COPD and , Pre-diabetes.    Duaine German, MD

## 2023-09-01 NOTE — Assessment & Plan Note (Signed)
 A1C is 5.9 today. Continue current regimen.  F/U in 6 months.

## 2023-09-01 NOTE — Patient Instructions (Signed)
 Reschedule your spirometry next month.

## 2023-09-02 ENCOUNTER — Encounter: Payer: Self-pay | Admitting: Family Medicine

## 2023-09-02 LAB — CMP14+EGFR
ALT: 16 IU/L (ref 0–32)
AST: 23 IU/L (ref 0–40)
Albumin: 4.6 g/dL (ref 3.8–4.8)
Alkaline Phosphatase: 90 IU/L (ref 44–121)
BUN/Creatinine Ratio: 12 (ref 12–28)
BUN: 11 mg/dL (ref 8–27)
Bilirubin Total: 0.5 mg/dL (ref 0.0–1.2)
CO2: 24 mmol/L (ref 20–29)
Calcium: 10 mg/dL (ref 8.7–10.3)
Chloride: 104 mmol/L (ref 96–106)
Creatinine, Ser: 0.94 mg/dL (ref 0.57–1.00)
Globulin, Total: 2.3 g/dL (ref 1.5–4.5)
Glucose: 100 mg/dL — ABNORMAL HIGH (ref 70–99)
Potassium: 4.3 mmol/L (ref 3.5–5.2)
Sodium: 143 mmol/L (ref 134–144)
Total Protein: 6.9 g/dL (ref 6.0–8.5)
eGFR: 62 mL/min/{1.73_m2} (ref >59)

## 2023-09-02 LAB — CBC
Hematocrit: 39.3 % (ref 34.0–46.6)
Hemoglobin: 13.3 g/dL (ref 11.1–15.9)
MCH: 31 pg (ref 26.6–33.0)
MCHC: 33.8 g/dL (ref 31.5–35.7)
MCV: 92 fL (ref 79–97)
Platelets: 215 10*3/uL (ref 150–450)
RBC: 4.29 x10E6/uL (ref 3.77–5.28)
RDW: 11.8 % (ref 11.7–15.4)
WBC: 5.9 10*3/uL (ref 3.4–10.8)

## 2023-09-02 LAB — LIPID PANEL
Chol/HDL Ratio: 3.6 ratio (ref 0.0–4.4)
Cholesterol, Total: 170 mg/dL (ref 100–199)
HDL: 47 mg/dL (ref >39)
LDL Chol Calc (NIH): 96 mg/dL (ref 0–99)
Triglycerides: 157 mg/dL — ABNORMAL HIGH (ref 0–149)
VLDL Cholesterol Cal: 27 mg/dL (ref 5–40)

## 2023-09-02 NOTE — Progress Notes (Signed)
 Hi Victoria Benjamin, metabolic panel is normal, LDL and total cholesterol look good.  Triglycerides are up just a little bit but better than last year so great working bringing that down.  Blood count is normal no sign of anemia.

## 2023-11-15 ENCOUNTER — Other Ambulatory Visit: Payer: Self-pay | Admitting: Family Medicine

## 2023-11-15 DIAGNOSIS — J449 Chronic obstructive pulmonary disease, unspecified: Secondary | ICD-10-CM

## 2023-11-18 ENCOUNTER — Ambulatory Visit: Payer: Medicare Other

## 2023-11-18 VITALS — Ht 62.0 in | Wt 148.0 lb

## 2023-11-18 DIAGNOSIS — Z0000 Encounter for general adult medical examination without abnormal findings: Secondary | ICD-10-CM

## 2023-11-18 NOTE — Progress Notes (Signed)
 Subjective:   Victoria Benjamin is a 78 y.o. female who presents for Medicare Annual (Subsequent) preventive examination.  Visit Complete: Virtual I connected with  Victoria Benjamin on 11/18/23 by a audio enabled telemedicine application and verified that I am speaking with the correct person using two identifiers.  Patient Location: Home  Provider Location: Office/Clinic  I discussed the limitations of evaluation and management by telemedicine. The patient expressed understanding and agreed to proceed.  Vital Signs: Because this visit was a virtual/telehealth visit, some criteria may be missing or patient reported. Any vitals not documented were not able to be obtained and vitals that have been documented are patient reported.  Patient Medicare AWV questionnaire was completed by the patient on n/a; I have confirmed that all information answered by patient is correct and no changes since this date.  Cardiac Risk Factors include: advanced age (>48men, >9 women)     Objective:    Today's Vitals   11/18/23 1102  Weight: 148 lb (67.1 kg)  Height: 5' 2 (1.575 m)   Body mass index is 27.07 kg/m.     11/18/2023   11:12 AM 11/17/2022   10:16 AM 11/11/2021   11:02 AM 10/29/2020    9:06 AM 11/29/2014    8:52 AM 05/25/2014    1:09 PM  Advanced Directives  Does Patient Have a Medical Advance Directive? Yes Yes Yes Yes Yes  Yes   Type of Estate agent of [place];Living will Healthcare Power of [place];Living will Living will Healthcare Power of [place];Living will  Healthcare Power of [place];Living will   Does patient want to make changes to medical advance directive? No - Patient declined No - Patient declined No - Patient declined   No - Patient declined   Copy of Healthcare Power of Attorney in Chart? No - copy requested No - copy requested  No - copy requested No - copy requested  No - copy requested      Data saved with a previous flowsheet row definition     Current Medications (verified) Outpatient Encounter Medications as of 11/18/2023  Medication Sig   albuterol  (VENTOLIN  HFA) 108 (90 Base) MCG/ACT inhaler Inhale 2 puffs into the lungs every 6 (six) hours as needed for wheezing or shortness of breath.   Calcium  Carbonate (CALCIUM  600 PO) Take by mouth.   cholecalciferol (VITAMIN D3) 25 MCG (1000 UNIT) tablet Take 1,000 Units by mouth daily.   omeprazole  (PRILOSEC) 40 MG capsule TAKE 1 CAPSULE BY MOUTH DAILY   rosuvastatin  (CRESTOR ) 10 MG tablet TAKE 1 TABLET BY MOUTH DAILY   SPIRIVA  HANDIHALER 18 MCG inhalation capsule INHALE THE CONTENTS OF 1 CAPSULE BY MOUTH VIA INHALATION DEVICE  DAILY   No facility-administered encounter medications on file as of 11/18/2023.    Allergies (verified) Codeine , Hydrocodone , and Lipitor [atorvastatin ]   History: Past Medical History:  Diagnosis Date   COPD (chronic obstructive pulmonary disease) (HCC)    Past Surgical History:  Procedure Laterality Date   ABDOMINAL HYSTERECTOMY     partial   BACK SURGERY     BREAST ENHANCEMENT SURGERY     BREAST EXCISIONAL BIOPSY Right    BREAST SURGERY     CAPSULOTOMY Right 08/01/2019   CATARACT EXTRACTION Right    HAND SURGERY  2006, 07   NECK SURGERY  2002   Family History  Problem Relation Age of Onset   Hypertension Mother    Arthritis Mother        osteo  Pulmonary embolism Mother    Heart failure Father    Hyperlipidemia Father    Alcoholism Father    Social History   Socioeconomic History   Marital status: Legally Separated    Spouse name: Not on file   Number of children: 2   Years of education: 12   Highest education level: 12th grade  Occupational History    Comment: Retired  Tobacco Use   Smoking status: Former    Current packs/day: 0.00    Average packs/day: 1 pack/day for 43.6 years (43.6 ttl pk-yrs)    Types: Cigarettes    Start date: 12/16/1959    Quit date: 07/17/2003    Years since quitting: 20.3   Smokeless tobacco: Never   Vaping Use   Vaping status: Never Used  Substance and Sexual Activity   Alcohol use: Yes    Alcohol/week: 5.0 standard drinks of alcohol    Types: 5 Glasses of wine per week   Drug use: No   Sexual activity: Not Currently  Other Topics Concern   Not on file  Social History Narrative   Lives alone. She has two children, one does live close by. She enjoys doing yard work in her free time.   Social Drivers of Corporate investment banker Strain: Low Risk  (11/18/2023)   Overall Financial Resource Strain (CARDIA)    Difficulty of Paying Living Expenses: Not hard at all  Food Insecurity: No Food Insecurity (11/18/2023)   Hunger Vital Sign    Worried About Running Out of Food in the Last Year: Never true    Ran Out of Food in the Last Year: Never true  Transportation Needs: No Transportation Needs (11/18/2023)   PRAPARE - Administrator, Civil Service (Medical): No    Lack of Transportation (Non-Medical): No  Physical Activity: Sufficiently Active (11/18/2023)   Exercise Vital Sign    Days of Exercise per Week: 4 days    Minutes of Exercise per Session: 40 min  Stress: No Stress Concern Present (11/18/2023)   [employer] of Occupational Health - Occupational Stress Questionnaire    Feeling of Stress: Not at all  Social Connections: Moderately Isolated (11/18/2023)   Social Connection and Isolation Panel    Frequency of Communication with Friends and Family: More than three times a week    Frequency of Social Gatherings with Friends and Family: More than three times a week    Attends Religious Services: More than 4 times per year    Active Member of [employer] or Organizations: No    Attends Banker Meetings: Never    Marital Status: Separated    Tobacco Counseling Counseling given: Not Answered   Clinical Intake:  Pre-visit preparation completed: Yes  Pain : No/denies pain     BMI - recorded: 27.07 Nutritional Status: BMI 25 -29 Overweight Nutritional  Risks: None Diabetes: No  How often do you need to have someone help you when you read instructions, pamphlets, or other written materials from your doctor or pharmacy?: 1 - Never What is the last grade level you completed in school?: 12  Interpreter Needed?: No      Activities of Daily Living    11/18/2023   11:04 AM  In your present state of health, do you have any difficulty performing the following activities:  Hearing? 0  Vision? 0  Difficulty concentrating or making decisions? 0  Walking or climbing stairs? 0  Dressing or bathing? 0  Doing errands, shopping?  0  Preparing Food and eating ? N  Using the Toilet? N  In the past six months, have you accidently leaked urine? Y  Do you have problems with loss of bowel control? N  Managing your Medications? N  Managing your Finances? N  Housekeeping or managing your Housekeeping? N    Patient Care Team: Alvan Dorothyann BIRCH, MD as PCP - General Curtis Debby PARAS, MD as Consulting Physician (Family Medicine)  Indicate any recent Medical Services you may have received from other than [facility] providers in the past year (date may be approximate).     Assessment:   This is a routine wellness examination for Victoria Benjamin.  Hearing/Vision screen No results found.   Goals Addressed             This Visit's Progress    Patient Stated       Patient would like to continue to maintain a healthy lifestyle.        Depression Screen    11/18/2023   11:10 AM 03/03/2023    9:06 AM 11/17/2022   10:16 AM 09/01/2022    9:01 AM 07/14/2022   11:14 AM 11/11/2021   11:03 AM 08/27/2021    9:09 AM  PHQ 2/9 Scores  PHQ - 2 Score 1 2 1 2 3  0 0  PHQ- 9 Score  6  4 4       Fall Risk    11/18/2023   11:12 AM 11/17/2022   10:16 AM 07/14/2022   11:14 AM 06/11/2022   11:15 AM 11/11/2021   11:03 AM  Fall Risk   Falls in the past year? 0 0 0 0 0  Number falls in past yr: 0 0 0 0 0  Injury with Fall? 0 0 0 0 0  Risk for fall due to : No Fall Risks  No Fall Risks No Fall Risks No Fall Risks No Fall Risks  Follow up Falls evaluation completed Falls evaluation completed Falls evaluation completed Falls evaluation completed Falls evaluation completed      Data saved with a previous flowsheet row definition    MEDICARE RISK AT HOME: Medicare Risk at Home Any stairs in or around the home?: Yes If so, are there any without handrails?: Yes Home free of loose throw rugs in walkways, pet beds, electrical cords, etc?: No Adequate lighting in your home to reduce risk of falls?: Yes Life alert?: No Use of a cane, walker or w/c?: No Grab bars in the bathroom?: No Shower chair or bench in shower?: No Elevated toilet seat or a handicapped toilet?: Yes  TIMED UP AND GO:  Was the test performed?  No    Cognitive Function:        11/18/2023   11:13 AM 11/17/2022   10:22 AM 11/11/2021   11:08 AM 10/29/2020    9:15 AM  6CIT Screen  What Year? 0 points 0 points 0 points 0 points  What month? 0 points 0 points 0 points 0 points  What time? 0 points 0 points 0 points 0 points  Count back from 20 0 points 0 points 0 points 0 points  Months in reverse 2 points 0 points 2 points 0 points  Repeat phrase 2 points 0 points 0 points 0 points  Total Score 4 points 0 points 2 points 0 points    Immunizations Immunization History  Administered Date(s) Administered   Fluad Quad(high Dose 65+) 02/07/2020, 02/15/2021, 02/05/2022   Fluad Trivalent(High Dose 65+) 12/25/2022   Influenza  Split 02/20/2011, 02/05/2012   Influenza Whole 03/31/2007, 02/24/2008, 05/18/2009, 02/21/2010   Influenza, High Dose Seasonal PF 01/22/2017, 02/10/2019   Influenza,inj,Quad PF,6+ Mos 02/02/2013, 02/23/2014, 12/27/2014, 02/28/2016, 01/21/2018   PFIZER(Purple Top)SARS-COV-2 Vaccination 06/07/2019, 06/28/2019, 02/29/2020   Pfizer(Comirnaty )Fall Seasonal Vaccine 12 years and older 04/23/2022   Pneumococcal Conjugate-13 12/27/2014   Pneumococcal Polysaccharide-23 02/02/2013    Respiratory Syncytial Virus Vaccine ,Recomb Aduvanted(Arexvy ) 04/23/2022   Td 08/30/2008   Tdap 11/14/2021   Zoster Recombinant(Shingrix) 02/29/2020, 11/14/2021    TDAP status: Up to date  Flu Vaccine status: Up to date  Pneumococcal vaccine status: Up to date  Covid-19 vaccine status: Completed vaccines  Qualifies for Shingles Vaccine? Yes   Zostavax completed No   Shingrix Completed?: Yes  Screening Tests Health Maintenance  Topic Date Due   COVID-19 Vaccine (5 - 2024-25 season) 01/11/2023   INFLUENZA VACCINE  12/11/2023   Medicare Annual Wellness (AWV)  11/17/2024   MAMMOGRAM  07/29/2025   DTaP/Tdap/Td (3 - Td or Tdap) 11/15/2031   Pneumococcal Vaccine: 50+ Years  Completed   DEXA SCAN  Completed   Hepatitis C Screening  Completed   Zoster Vaccines- Shingrix  Completed   Hepatitis B Vaccines  Aged Out   HPV VACCINES  Aged Out   Meningococcal B Vaccine  Aged Out   Colonoscopy  Discontinued   Fecal DNA (Cologuard)  Discontinued    Health Maintenance  Health Maintenance Due  Topic Date Due   COVID-19 Vaccine (5 - 2024-25 season) 01/11/2023    Colorectal cancer screening: Type of screening: Cologuard. Completed 09/03/2021. Repeat every 3 years  Mammogram status: Completed 07/30/2023. Repeat every year  Bone Density status: Completed 03/11/2023. Results reflect: Bone density results: OSTEOPENIA. Repeat every 2 years.  Lung Cancer Screening: (Low Dose CT Chest recommended if Age 58-80 years, 20 pack-year currently smoking OR have quit w/in 15years.) does not qualify.   Lung Cancer Screening Referral: n/a  Additional Screening:  Hepatitis C Screening: does not qualify; Completed 12/27/2014  Vision Screening: Recommended annual ophthalmology exams for early detection of glaucoma and other disorders of the eye. Is the patient up to date with their annual eye exam?  Yes  Who is the provider or what is the name of the office in which the patient attends annual eye  exams? [facility] If pt is not established with a provider, would they like to be referred to a provider to establish care? N/a.   Dental Screening: Recommended annual dental exams for proper oral hygiene   Community Resource Referral / Chronic Care Management: CRR required this visit?  No   CCM required this visit?  No     Plan:     I have personally reviewed and noted the following in the patient's chart:   Medical and social history Use of alcohol, tobacco or illicit drugs  Current medications and supplements including opioid prescriptions. Patient is not currently taking opioid prescriptions. Functional ability and status Nutritional status Physical activity Advanced directives List of other physicians Hospitalizations, surgeries, and ER visits in previous 12 months. None Vitals Screenings to include cognitive, depression, and falls Referrals and appointments  In addition, I have reviewed and discussed with patient certain preventive protocols, quality metrics, and best practice recommendations. A written personalized care plan for preventive services as well as general preventive health recommendations were provided to patient.     Bonny Jon Mayor, CMA   11/18/2023   After Visit Summary: (Declined) Due to this being a telephonic visit, with patients personalized plan  was offered to patient but patient Declined AVS at this time   Nurse Notes:    Victoria Benjamin is a 78 y.o. female patient of Metheney, Dorothyann BIRCH, MD who had a Medicare Annual Wellness Visit today via telephone. Victoria Benjamin is Retired and lives alone. She has 2 children. She reports that she is socially active and does interact with friends/family regularly. She is moderately physically active and enjoys yard work.

## 2023-11-18 NOTE — Patient Instructions (Signed)
  Victoria Benjamin , Thank you for taking time to come for your Medicare Wellness Visit. I appreciate your ongoing commitment to your health goals. Please review the following plan we discussed and let me know if I can assist you in the future.   These are the goals we discussed:  Goals       Patient Stated (pt-stated)      10/29/2020 AWV Goal: Exercise for General Health  Patient will verbalize understanding of the benefits of increased physical activity: Exercising regularly is important. It will improve your overall fitness, flexibility, and endurance. Regular exercise also will improve your overall health. It can help you control your weight, reduce stress, and improve your bone density. Over the next year, patient will increase physical activity as tolerated with a goal of at least 150 minutes of moderate physical activity per week.  You can tell that you are exercising at a moderate intensity if your heart starts beating faster and you start breathing faster but can still hold a conversation. Moderate-intensity exercise ideas include: Walking 1 mile (1.6 km) in about 15 minutes Biking Hiking Golfing Dancing Water aerobics Patient will verbalize understanding of everyday activities that increase physical activity by providing examples like the following: Yard work, such as: Insurance underwriter Gardening Washing windows or floors Patient will be able to explain general safety guidelines for exercising:  Before you start a new exercise program, talk with your health care provider. Do not exercise so much that you hurt yourself, feel dizzy, or get very short of breath. Wear comfortable clothes and wear shoes with good support. Drink plenty of water while you exercise to prevent dehydration or heat stroke. Work out until your breathing and your heartbeat get faster.       Patient Stated (pt-stated)       Continue to stay healthy.      Patient Stated (pt-stated)      Patient stated that she would like to loose weight.      Patient Stated      Patient would like to continue to maintain a healthy lifestyle.         This is a list of the screening recommended for you and due dates:  Health Maintenance  Topic Date Due   COVID-19 Vaccine (5 - 2024-25 season) 01/11/2023   Flu Shot  12/11/2023   Medicare Annual Wellness Visit  11/17/2024   Mammogram  07/29/2025   DTaP/Tdap/Td vaccine (3 - Td or Tdap) 11/15/2031   Pneumococcal Vaccine for age over 65  Completed   DEXA scan (bone density measurement)  Completed   Hepatitis C Screening  Completed   Zoster (Shingles) Vaccine  Completed   Hepatitis B Vaccine  Aged Out   HPV Vaccine  Aged Out   Meningitis B Vaccine  Aged Out   Colon Cancer Screening  Discontinued   Cologuard (Stool DNA test)  Discontinued

## 2023-12-03 ENCOUNTER — Ambulatory Visit: Payer: Self-pay

## 2023-12-03 NOTE — Telephone Encounter (Signed)
 FYI Only or Action Required?: FYI only for provider.  Patient was last seen in primary care on 09/01/2023 by Alvan Dorothyann BIRCH, MD.  Called Nurse Triage reporting Hip Pain.  Symptoms began several weeks ago.  Interventions attempted: Nothing.  Symptoms are: unchanged.  Triage Disposition: See PCP When Office is Open (Within 7 Days)  Patient/caregiver understands and will follow disposition?: Yes  Apt next week                       Copied from CRM #[id number]. Topic: Clinical - Red Word Triage >> Dec 03, 2023  9:18 AM Alfonso ORN wrote: Red Word that prompted transfer to Nurse Triage: hip pain rate 7 , pain ease when lay down patient call back # [phone]    ----------------------------------------------------------------------- From previous Reason for Contact - Scheduling: Patient/patient representative is calling to schedule an appointment. Refer to attachments for appointment information. Reason for Disposition  [1] MODERATE pain (e.g., interferes with normal activities, limping) AND [2] present > 3 days  Answer Assessment - Initial Assessment Questions 1. LOCATION and RADIATION: Where is the pain located? Does the pain spread (shoot) anywhere else?     Left hip 2. QUALITY: What does the pain feel like?  (e.g., sharp, dull, aching, burning)     All of the above 3. SEVERITY: How bad is the pain? What does it keep you from doing?   (Scale 1-10; or mild, moderate, severe)     2/10 and can go to a 7/10 4. ONSET: When did the pain start? Does it come and go, or is it there all the time?     3 weeks ago 5. WORK OR EXERCISE: Has there been any recent work or exercise that involved this part of the body?      denies 6. CAUSE: What do you think is causing the hip pain?      strain 7. AGGRAVATING FACTORS: What makes the hip pain worse? (e.g., walking, climbing stairs, running)     walking 8. OTHER SYMPTOMS: Do you have any other  symptoms? (e.g., back pain, pain shooting down leg,  fever, rash)     Denies.  Protocols used: Hip Pain-A-AH

## 2023-12-10 ENCOUNTER — Ambulatory Visit: Payer: Self-pay

## 2023-12-10 ENCOUNTER — Ambulatory Visit: Admitting: Family Medicine

## 2023-12-10 NOTE — Telephone Encounter (Signed)
 If earlier appt available, please contact patient at listed telephone number. Declined UC.  FYI Only or Action Required?: Action required by provider: request for appointment.  Patient was last seen in primary care on 09/01/2023 by Alvan Dorothyann BIRCH, MD.  Called Nurse Triage reporting No chief complaint on file..  Symptoms began several weeks ago.  Interventions attempted: OTC medications: tylenol  and Rest, hydration, or home remedies.  Symptoms are: gradually worsening.  Triage Disposition: See PCP Within 2 Weeks (overriding See PCP When Office is Open (Within 3 Days))  Patient/caregiver understands and will follow disposition?: Yes Reason for Disposition  [1] MODERATE pain (e.g., interferes with normal activities) AND [2] present > 3 days  Answer Assessment - Initial Assessment Questions Patient states wrist pain is not improving with tylenol  and brace. Offered assistance finding closest UC and wait times, but patient declined, stating she would prefer to wait and be seen in office. This RN will route message to provider's office.   1. ONSET: When did the pain start?     4 weeks, no improvement with brace  2. LOCATION: Where is the pain located?     Both wrists, left is worse  3. PAIN: How bad is the pain? (Scale 1-10; or mild, moderate, severe)     8/10  6. AGGRAVATING FACTORS: What makes the pain worse? (e.g., using computer)     When not at rest  Protocols used: Wrist Pain-A-AH Copied from CRM [id number]. Topic: Clinical - Red Word Triage >> Dec 10, 2023  9:02 AM Miquel SAILOR wrote: Red Word that prompted transfer to Nurse Triage:  LT wrist pain for 2 weeks but getting worse

## 2023-12-17 ENCOUNTER — Other Ambulatory Visit: Payer: Self-pay | Admitting: Family Medicine

## 2023-12-17 DIAGNOSIS — E785 Hyperlipidemia, unspecified: Secondary | ICD-10-CM

## 2023-12-23 ENCOUNTER — Encounter: Payer: Self-pay | Admitting: Family Medicine

## 2023-12-23 ENCOUNTER — Ambulatory Visit

## 2023-12-23 ENCOUNTER — Ambulatory Visit: Admitting: Family Medicine

## 2023-12-23 VITALS — BP 132/77 | HR 88 | Ht 62.0 in | Wt 148.0 lb

## 2023-12-23 DIAGNOSIS — M25552 Pain in left hip: Secondary | ICD-10-CM | POA: Diagnosis not present

## 2023-12-23 DIAGNOSIS — M160 Bilateral primary osteoarthritis of hip: Secondary | ICD-10-CM | POA: Diagnosis not present

## 2023-12-23 DIAGNOSIS — G5603 Carpal tunnel syndrome, bilateral upper limbs: Secondary | ICD-10-CM | POA: Diagnosis not present

## 2023-12-23 DIAGNOSIS — G8929 Other chronic pain: Secondary | ICD-10-CM

## 2023-12-23 DIAGNOSIS — M47816 Spondylosis without myelopathy or radiculopathy, lumbar region: Secondary | ICD-10-CM | POA: Diagnosis not present

## 2023-12-23 NOTE — Progress Notes (Signed)
 Acute Office Visit  Subjective:     Patient ID: Victoria Benjamin, female    DOB: 1945/12/04, 78 y.o.   MRN: [id number]  Chief Complaint  Patient presents with   Wrist Pain   Groin Pain    L sided groin pain    HPI Patient is in today left groin pain for several weeks.  She says sometimes it almost feels like it wants to catch and she will turn her foot slightly.  She just wonders if it could be arthritis she is as of additional just learned to live with it is not severe and debilitating.  Complaining of bilateral wrist pain.  Actually saw her back in April for her left wrist in particular.  It seemed most consistent with carpal tunnel.  She gets burning pain and sometimes a itchy sensation particularly in the thumb index and middle fingers on both hands.  She has been wearing a cock up splint on that left wrist since end of April so for about 3-1/2 months but has not gotten any improvement or resolution of her symptoms it is made it more difficult to work in her yard.  She says the pain is bothersome enough at night that sometimes she just has to get up.  She says 30 years ago she had carpal tunnel and ended up having injections and actually surgery on her left wrist.  ROS      Objective:    BP 132/77   Pulse 88   Ht 5' 2 (1.575 m)   Wt 148 lb (67.1 kg)   SpO2 95%   BMI 27.07 kg/m    Physical Exam Vitals and nursing note reviewed.  Constitutional:      Appearance: Normal appearance.  HENT:     Head: Normocephalic and atraumatic.  Eyes:     Conjunctiva/sclera: Conjunctivae normal.  Cardiovascular:     Rate and Rhythm: Normal rate and regular rhythm.  Pulmonary:     Effort: Pulmonary effort is normal.     Breath sounds: Normal breath sounds.  Musculoskeletal:     Comments: Left hip with normal flexion and extension.  Hip strength is 5 out of 5.  Some pain with internal rotation no pain with external rotation.  Skin:    General: Skin is warm and dry.   Neurological:     Mental Status: She is alert.  Psychiatric:        Mood and Affect: Mood normal.     No results found for any visits on 12/23/23.      Assessment & Plan:   Problem List Items Addressed This Visit   None Visit Diagnoses       Bilateral carpal tunnel syndrome    -  Primary     Left hip pain       Relevant Orders   DG Hip Unilat W OR W/O Pelvis 2-3 Views Left       Bilateral carpal tunnel-cock-up splint's are not effective him to have him schedule with Dr. ONEIDA on her way out I think we need to move forward with next steps I think she would probably be a great candidate for injections but I will leave that up to him.  Left groin pain - will get xray. Ok to change to Tylenol  Arhthis. Likely OA.  Call with results once available if persist or worsens consider formal PT if needed.  If still not improving then consider further workup for labral tear etc.  No orders of  the defined types were placed in this encounter.   No follow-ups on file.  Dorothyann Byars, MD

## 2023-12-23 NOTE — Progress Notes (Signed)
        Established patient visit   History of Present Illness   Discussed the use of AI scribe software for clinical note transcription with the patient, who gave verbal consent to proceed.  History of Present Illness            Physical Exam   Physical Exam  Assessment & Plan   Assessment and Plan               Follow up   No follow-ups on file.  __________________________________ Zada FREDRIK Palin, DNP, APRN, FNP-BC Primary Care and Sports Medicine [facility] [place]

## 2023-12-29 ENCOUNTER — Ambulatory Visit: Admitting: Sports Medicine

## 2024-01-04 ENCOUNTER — Ambulatory Visit: Admitting: Sports Medicine

## 2024-01-05 ENCOUNTER — Telehealth: Payer: Self-pay | Admitting: Family Medicine

## 2024-01-05 NOTE — Telephone Encounter (Signed)
 Copied from CRM [id number]. Topic: Referral - Request for Referral >> Jan 05, 2024 11:42 AM Kevelyn M wrote: Did the patient discuss referral with their provider in the last year? Yes (If No - schedule appointment) (If Yes - send message)  Appointment offered? Yes  Type of order/referral and detailed reason for visit: New ortho doctor, [place] and [place] is too far  Preference of office, provider, location: [facility]  If referral order, have you been seen by this specialty before? Yes (If Yes, this issue or another issue? When? Where?  Can we respond through MyChart? No

## 2024-01-06 DIAGNOSIS — M79642 Pain in left hand: Secondary | ICD-10-CM | POA: Diagnosis not present

## 2024-01-06 DIAGNOSIS — G5603 Carpal tunnel syndrome, bilateral upper limbs: Secondary | ICD-10-CM | POA: Diagnosis not present

## 2024-01-06 DIAGNOSIS — M79641 Pain in right hand: Secondary | ICD-10-CM | POA: Diagnosis not present

## 2024-01-07 ENCOUNTER — Telehealth: Payer: Self-pay | Admitting: Family Medicine

## 2024-01-07 ENCOUNTER — Ambulatory Visit: Payer: Self-pay | Admitting: Family Medicine

## 2024-01-07 NOTE — Telephone Encounter (Signed)
 error

## 2024-01-07 NOTE — Progress Notes (Signed)
 Hi Victoria Benjamin, just wanted let you know that your x-ray of your left hip shows no sign of fracture or dislocation you do have some pretty advanced arthritic changes in that joint and even on the right side as well.  There is some joint space narrowing where the cartilage is worn.  You also have a lot of arthritis and degenerative changes in the your low back as well that they were able to see in the x-rays.  Of options.  1 would be to do some formal physical therapy for the hip and maybe even possibly get an injection.  A couple the PT could be done here but the injection could be done at [facility] in [place].  I know you have seen one of their providers before for the carpal tunnel.

## 2024-01-12 ENCOUNTER — Encounter: Payer: Self-pay | Admitting: Sports Medicine

## 2024-02-24 ENCOUNTER — Ambulatory Visit: Admitting: Family Medicine

## 2024-02-24 DIAGNOSIS — M170 Bilateral primary osteoarthritis of knee: Secondary | ICD-10-CM | POA: Diagnosis not present

## 2024-03-02 ENCOUNTER — Ambulatory Visit: Admitting: Family Medicine

## 2024-03-02 NOTE — Progress Notes (Deleted)
   Established Patient Office Visit  Subjective  Patient ID: Victoria Benjamin, female    DOB: August 22, 1945  Age: 78 y.o. MRN: [id number]  No chief complaint on file.   HPI  {History (Optional):23778}  ROS    Objective:     There were no vitals taken for this visit. {Vitals History (Optional):23777}  Physical Exam   No results found for any visits on 03/02/24.  {Labs (Optional):23779}  The 10-year ASCVD risk score (Arnett DK, et al., 2019) is: 20.5%    Assessment & Plan:   Problem List Items Addressed This Visit       Respiratory   Chronic obstructive pulmonary disease (HCC) - Primary     Endocrine   IFG (impaired fasting glucose)    No follow-ups on file.    Dorothyann Byars, MD

## 2024-03-07 ENCOUNTER — Ambulatory Visit (INDEPENDENT_AMBULATORY_CARE_PROVIDER_SITE_OTHER): Admitting: Family Medicine

## 2024-03-07 ENCOUNTER — Encounter: Payer: Self-pay | Admitting: Family Medicine

## 2024-03-07 VITALS — BP 124/74 | HR 73 | Ht 62.0 in | Wt 146.6 lb

## 2024-03-07 DIAGNOSIS — J449 Chronic obstructive pulmonary disease, unspecified: Secondary | ICD-10-CM | POA: Diagnosis not present

## 2024-03-07 DIAGNOSIS — Z23 Encounter for immunization: Secondary | ICD-10-CM | POA: Diagnosis not present

## 2024-03-07 DIAGNOSIS — R7301 Impaired fasting glucose: Secondary | ICD-10-CM | POA: Diagnosis not present

## 2024-03-07 LAB — POCT GLYCOSYLATED HEMOGLOBIN (HGB A1C): Hemoglobin A1C: 6.1 % — AB (ref 4.0–5.6)

## 2024-03-07 NOTE — Progress Notes (Signed)
 Established Patient Office Visit  Patient ID: Victoria Benjamin, female    DOB: July 12, 1945  Age: 78 y.o. MRN: [id number] PCP: Alvan Dorothyann BIRCH, MD  Chief Complaint  Patient presents with   Anemia    Subjective:     HPI  Discussed the use of AI scribe software for clinical note transcription with the patient, who gave verbal consent to proceed.  History of Present Illness Victoria Benjamin is a 78 year old female who presents for a six-month follow-up visit.  Glycemic status and lipid management - Monitored for prediabetes - A1c levels being checked today - Currently taking Crestor  for cholesterol management  Chronic obstructive pulmonary disease (copd) and inhaler-related symptoms - Has COPD - Recently discontinued inhaler use for about one week due to bleeding at the back of the tongue, suspected to be caused by the inhaler - Bleeding resolved after discontinuing the inhaler - Currently using Spiriva  - Uses albuterol  as needed, but rarely  Musculoskeletal pain and corticosteroid injections - Received cortisone injections for knees and wrist, resulting in significant improvement in quality of life - No cortisone injection received in the hip  Cardiopulmonary and fall risk symptoms - No chest pain - No shortness of breath - No falls in the past year  Mood and psychosocial status - Mood is generally good - Coping with the recent loss of her 35 year old dog, who was her companion for many years     ROS    Objective:     BP 124/74   Pulse 73   Ht 5' 2 (1.575 m)   Wt 146 lb 9.6 oz (66.5 kg)   SpO2 100%   BMI 26.81 kg/m    Physical Exam Vitals and nursing note reviewed.  Constitutional:      Appearance: Normal appearance.  HENT:     Head: Normocephalic and atraumatic.  Eyes:     Conjunctiva/sclera: Conjunctivae normal.  Cardiovascular:     Rate and Rhythm: Normal rate and regular rhythm.  Pulmonary:     Effort: Pulmonary effort is normal.      Breath sounds: Normal breath sounds.  Skin:    General: Skin is warm and dry.  Neurological:     Mental Status: She is alert.  Psychiatric:        Mood and Affect: Mood normal.      Results for orders placed or performed in visit on 03/07/24  POCT HgB A1C  Result Value Ref Range   Hemoglobin A1C 6.1 (A) 4.0 - 5.6 %   HbA1c POC (<> result, manual entry)     HbA1c, POC (prediabetic range)     HbA1c, POC (controlled diabetic range)        The 10-year ASCVD risk score (Arnett DK, et al., 2019) is: 20.3%    Assessment & Plan:   Problem List Items Addressed This Visit       Respiratory   Chronic obstructive pulmonary disease (HCC)   Relevant Orders   CMP14+EGFR     Endocrine   IFG (impaired fasting glucose) - Primary   Relevant Orders   POCT HgB A1C (Completed)   CMP14+EGFR   Other Visit Diagnoses       Encounter for immunization       Relevant Orders   Flu vaccine HIGH DOSE PF(Fluzone Trivalent) (Completed)       Assessment and Plan Assessment & Plan Impaired fasting glucose (prediabetes) Monitoring A1c levels to evaluate prediabetes status. - Order A1c test to assess glucose  control.  Chronic obstructive pulmonary disease (COPD) Managed with Spiriva  and albuterol . Resumed inhaler use after resolving bleeding issue. - Continue Spiriva  for COPD management. - Use albuterol  as needed for acute symptoms. - Monitor for recurrence of bleeding symptoms and consider alternative treatments if symptoms recur. - declined COVID 19   Advanced osteoarthritis of bilateral hips and degenerative changes in low back Managed with cortisone injections in knees and wrists, providing relief.  General Health Maintenance Received flu vaccination. Mood stable. - Administer flu shot.   Return in about 6 months (around 09/05/2024) for Pre-diabetes.    Dorothyann Byars, MD [facility] at [facility]

## 2024-03-08 ENCOUNTER — Ambulatory Visit: Payer: Self-pay | Admitting: Family Medicine

## 2024-03-08 LAB — CMP14+EGFR
ALT: 15 IU/L (ref 0–32)
AST: 13 IU/L (ref 0–40)
Albumin: 4.4 g/dL (ref 3.8–4.8)
Alkaline Phosphatase: 106 IU/L (ref 49–135)
BUN/Creatinine Ratio: 17 (ref 12–28)
BUN: 15 mg/dL (ref 8–27)
Bilirubin Total: 0.6 mg/dL (ref 0.0–1.2)
CO2: 23 mmol/L (ref 20–29)
Calcium: 9.7 mg/dL (ref 8.7–10.3)
Chloride: 102 mmol/L (ref 96–106)
Creatinine, Ser: 0.87 mg/dL (ref 0.57–1.00)
Globulin, Total: 2.4 g/dL (ref 1.5–4.5)
Glucose: 98 mg/dL (ref 70–99)
Potassium: 4.1 mmol/L (ref 3.5–5.2)
Sodium: 141 mmol/L (ref 134–144)
Total Protein: 6.8 g/dL (ref 6.0–8.5)
eGFR: 68 mL/min/1.73 (ref >59)

## 2024-03-08 LAB — SPECIMEN STATUS REPORT

## 2024-03-08 NOTE — Progress Notes (Signed)
 Deannah, metabolic panel looks normal.

## 2024-04-12 ENCOUNTER — Other Ambulatory Visit: Payer: Self-pay | Admitting: Family Medicine

## 2024-04-12 DIAGNOSIS — K2100 Gastro-esophageal reflux disease with esophagitis, without bleeding: Secondary | ICD-10-CM

## 2024-05-31 ENCOUNTER — Other Ambulatory Visit: Payer: Self-pay

## 2024-05-31 ENCOUNTER — Ambulatory Visit: Admitting: Rehabilitative and Restorative Service Providers"

## 2024-05-31 ENCOUNTER — Encounter: Payer: Self-pay | Admitting: Rehabilitative and Restorative Service Providers"

## 2024-05-31 DIAGNOSIS — M25512 Pain in left shoulder: Secondary | ICD-10-CM | POA: Insufficient documentation

## 2024-05-31 DIAGNOSIS — M7501 Adhesive capsulitis of right shoulder: Secondary | ICD-10-CM | POA: Diagnosis not present

## 2024-05-31 DIAGNOSIS — M7502 Adhesive capsulitis of left shoulder: Secondary | ICD-10-CM | POA: Diagnosis not present

## 2024-05-31 DIAGNOSIS — R293 Abnormal posture: Secondary | ICD-10-CM | POA: Diagnosis not present

## 2024-05-31 DIAGNOSIS — M6281 Muscle weakness (generalized): Secondary | ICD-10-CM | POA: Diagnosis present

## 2024-05-31 DIAGNOSIS — R29898 Other symptoms and signs involving the musculoskeletal system: Secondary | ICD-10-CM

## 2024-05-31 DIAGNOSIS — M25511 Pain in right shoulder: Secondary | ICD-10-CM | POA: Diagnosis not present

## 2024-05-31 NOTE — Therapy (Signed)
 " OUTPATIENT PHYSICAL THERAPY SHOULDER EVALUATION   Patient Name: Victoria Benjamin MRN: [id number] DOB:November 17, 1945, 79 y.o., female Today's Date: 05/31/2024  END OF SESSION:  PT End of Session - 05/31/24 0945     Visit Number 1    Number of Visits 16    Date for Recertification  07/26/24    Authorization Type [employer] medicare auth required $20 copay    Progress Note Due on Visit 10    PT Start Time 0940    PT Stop Time 1025    PT Time Calculation (min) 45 min    Activity Tolerance Patient tolerated treatment well          Past Medical History:  Diagnosis Date   COPD (chronic obstructive pulmonary disease) (HCC)    Past Surgical History:  Procedure Laterality Date   ABDOMINAL HYSTERECTOMY     partial   BACK SURGERY     BREAST ENHANCEMENT SURGERY     BREAST EXCISIONAL BIOPSY Right    BREAST SURGERY     CAPSULOTOMY Right 08/01/2019   CATARACT EXTRACTION Right    HAND SURGERY  2006, 07   NECK SURGERY  2002   Patient Active Problem List   Diagnosis Date Noted   Chronic obstructive pulmonary disease (HCC) 11/15/2020   Polyarthralgia 08/16/2020   TMJ tenderness, left 08/16/2020   Gastroesophageal reflux disease with esophagitis without hemorrhage 07/25/2019   Elevated serum creatinine 07/25/2019   Combined forms of age-related cataract of left eye 05/20/2018   Regular astigmatism of left eye 05/20/2018   BPPV (benign paroxysmal positional vertigo) 03/03/2016   Left lumbar radiculopathy 11/07/2014   Primary osteoarthritis of both knees 06/20/2014   Osteoarthritis of left hip 06/20/2014   Osteopenia 06/19/2014   Left third MTP synovitis 12/15/2013   Metatarsalgia of right foot 12/15/2013   IFG (impaired fasting glucose) 05/11/2013   MENOPAUSAL SYNDROME 09/02/2007   Hyperlipidemia 09/23/2006   History of depression 09/23/2006    PCP: Dr Dorothyann JONETTA Byars  REFERRING PROVIDER: Katrinka King, PA-C  REFERRING DIAG: Adhesive capsulitis bilat shoulder   THERAPY DIAG:   Acute pain of left shoulder  Acute pain of right shoulder  Other symptoms and signs involving the musculoskeletal system  Abnormal posture  Muscle weakness (generalized)  Rationale for Evaluation and Treatment: Rehabilitation  ONSET DATE: 11/10/23  SUBJECTIVE:                                                                                                                                                                                      SUBJECTIVE STATEMENT: Patient reports that she had pain in both hands and wrists ~ 6 months ago. She had  a shot in both hands and pain resolved. She noticed that she had pain in her shoulders as well as knees. She received shots in knees which helped the shoulders some. Then shoulders continued to hurt. She did receive injections in shoulders in both shoulder 04/01/24 with no improvement.     Hand dominance: Right  PERTINENT HISTORY: Arthritis bilat knees has received injections in knees with good results. First round of shots lasted ~ 6 years; history of COPD; elevated cholesterol   PAIN:  Are you having pain? Yes: NPRS scale: 0/10; worst in past week 10/10 Pain location: bilat shoulders Pain description: sharp Aggravating factors: moving arms; reaching; lifting; unable to lie on side  Relieving factors: tylenol ; avoiding movement  PRECAUTIONS: None  RED FLAGS: None   WEIGHT BEARING RESTRICTIONS: No  FALLS:  Has patient fallen in last 6 months? 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  DIAGNOSTIC FINDINGS:  Xrays of shoulders per patient not available   PATIENT SURVEYS:  Quick DASH - 54.5/100; 54.5%  COGNITION: Overall cognitive status: Within functional limits for tasks assessed     SENSATION: WFL  POSTURE: Patient presents with head forward posture with increased thoracic kyphosis; shoulders rounded and elevated; scapulae abducted and rotated along the thoracic spine; head of the humerus anterior in orientation.   UPPER EXTREMITY ROM:   Active ROM Right eval Left eval  Shoulder flexion 62 51  Shoulder extension 40 17  Shoulder abduction 64 45  Shoulder adduction    Shoulder internal rotation Hand lateral hip Hand to lower sacrum   Shoulder external rotation 18 16  Elbow flexion    Elbow extension    Wrist flexion    Wrist extension    Wrist ulnar deviation    Wrist radial deviation    Wrist pronation    Wrist supination    (Blank rows = not tested)  UPPER EXTREMITY MMT:   Functional strength in available small range - patient tolerates light resistance with MMT but has pain with resistance in all planes  MMT Right eval Left eval  Shoulder flexion    Shoulder extension    Shoulder abduction    Shoulder adduction    Shoulder internal rotation    Shoulder external rotation    Middle trapezius    Lower trapezius    Elbow flexion    Elbow extension    Wrist flexion    Wrist extension    Wrist ulnar deviation    Wrist radial deviation    Wrist pronation    Wrist supination    Grip strength (lbs)    (Blank rows = not tested)   JOINT MOBILITY TESTING:  Decreased joint mobility bilat UE's   PALPATION:  Muscular tightness pecs; upper trap; leveator; deltoid and biceps L > R  TREATMENT DATE: review of evaluation; education re-diagnosis of  adhesive capsulitis; HEP    PATIENT EDUCATION: Education details: POC; HEP Person educated: Patient Education method: Explanation, Demonstration, Tactile cues, Verbal cues, and Handouts Education comprehension: verbalized understanding, returned demonstration, verbal cues required, tactile cues required, and needs further education  HOME EXERCISE PROGRAM: Access Code: [email or web address] URL: [email or web address] Date: 05/31/2024 Prepared by: Daryl Beehler  Exercises - Circular Shoulder Pendulum with Table Support  - 3-4 x daily - 7 x weekly - 1 sets - 20-30 reps - Seated Shoulder Flexion Towel Slide at Table Top Full Range of Motion  - 2 x daily - 7 x weekly - 1 sets - 5-10 reps - 10sec  hold - Seated Shoulder Flexion AAROM with Pulley Behind  - 2 x daily - 7 x weekly - 1 sets - 10 reps - 10 sec  hold - Seated Shoulder Scaption AAROM with Pulley at Side  - 2 x daily - 7 x weekly - 1 sets - 10 reps - 10sec  hold - Seated Scapular Retraction  - 2 x daily - 7 x weekly - 1-2 sets - 10 reps - 10 sec  hold  ASSESSMENT:  CLINICAL IMPRESSION: Patient is a 79 y.o. female who was seen today for physical therapy evaluation and treatment for bilat adhesive capsulitis. Signs and symptoms are consistent with diagnosis including pain with active movement; limited ROM/mobility; decreased strength and functional activity tolerance bilat shoulders/UEs L > R. Patient will benefit from PT to address problems identified   OBJECTIVE IMPAIRMENTS: decreased activity tolerance, decreased mobility, decreased ROM, decreased strength, increased fascial restrictions, impaired flexibility, impaired UE functional use, improper body mechanics, postural dysfunction, and pain.   ACTIVITY LIMITATIONS: carrying, lifting, sleeping, bathing, toileting, dressing, reach over head, and hygiene/grooming  PARTICIPATION LIMITATIONS: meal prep, cleaning, laundry, driving, shopping, community activity, and yard work  PERSONAL  FACTORS: Age, Behavior pattern, Fitness, Past/current experiences, Profession, Time since onset of injury/illness/exacerbation, and  comorbidities as noted above are also affecting patient's functional outcome.   REHAB POTENTIAL: Good  CLINICAL DECISION MAKING: Evolving/moderate complexity  EVALUATION COMPLEXITY: Moderate   GOALS: Goals reviewed with patient? Yes  SHORT TERM GOALS: Target date: 06/28/2024   Decrease pain bilat shoulders by 25-30% allowing patient to participate in exercises and functional activities  Baseline: Goal status: INITIAL  2.  Increase AROM bilat shoulders in elevation by 10 degrees  Baseline:  Goal status: INITIAL  3.  Patient reports ability to sleep for longer periods of time (4 hours) without awakening due to shoulder pain  Baseline:  Goal status: INITIAL  LONG TERM GOALS: Target date: 07/26/2024   Decrease bilat shoulder pain by 75% allowing patient to participate in more functional activities  Baseline:  Goal status: INITIAL  2.  Patient demonstrates increased strength bilat UE's allowing movement through the available range to reach into kitchen cabinets  Baseline:  Goal status: INITIAL  3.  Increase AROM bilat shoulders to WFL's allowing patient to reach overhead and wash hair, etc  Baseline:  Goal status: INITIAL  4.  Improve Quick Dash score by 10% indicating increased functional activity level  Baseline: 54.5/100; 54.5%  Goal status: INITIAL  5.  Independent in advanced HEP including aquatic program as indicated  Baseline:  Goal status: INITIAL  PLAN:  PT FREQUENCY: 1-2x/week  PT DURATION: 8 weeks  PLANNED INTERVENTIONS: 97164- PT Re-evaluation, 97110-Therapeutic exercises, 97530- Therapeutic activity, [id number]- Neuromuscular re-education, 97535- Self Care, 02859- Manual therapy, [id number]- Aquatic Therapy, [id number]- Ionotophoresis 4mg /ml Dexamethasone ,  Patient/Family education, Taping, and Joint mobilization  PLAN FOR NEXT SESSION:  review and progress exercises; continue with education re-pathology and progression of exercises; ergonomic suggestions; manual work and modalities as indicated    Gina Leblond P Larcenia Holaday, PT 05/31/2024, 9:49 AM  "

## 2024-06-01 ENCOUNTER — Telehealth: Payer: Self-pay | Admitting: Family Medicine

## 2024-06-01 DIAGNOSIS — M170 Bilateral primary osteoarthritis of knee: Secondary | ICD-10-CM

## 2024-06-01 NOTE — Telephone Encounter (Signed)
Needs to go to Triage

## 2024-06-01 NOTE — Telephone Encounter (Signed)
 Patient is returning call. CAL stated that Jon is gone for the day.

## 2024-06-01 NOTE — Telephone Encounter (Signed)
 I called patient. No answer and the voicemail has not been set up.

## 2024-06-01 NOTE — Telephone Encounter (Signed)
 Copied from CRM #[id number]. Topic: Referral - Request for Referral >> Jun 01, 2024 12:09 PM Ivette P wrote: Did the patient discuss referral with their provider in the last year? Yes (If No - schedule appointment) (If Yes - send message)  Appointment offered? No  Type of order/referral and detailed reason for visit:   [facility]   Preference of office, provider, location: [facility]   If referral order, have you been seen by this specialty before? Yes (If Yes, this issue or another issue? When? Where?  Can we respond through MyChart? Yes

## 2024-06-02 NOTE — Telephone Encounter (Signed)
 Spoke with patient - requesting referral to [facility] -  States has seen Dr. ONEIDA in past and was given gel knee injections -for bilateral knee pain.  She is wanting to continue this with most recent injection recommended  Offered a referral to a [facility] provider - recommended Dr. Charles in [place]  She states she does not want to go to [facility].   She asked if we would be getting an new orthopaedic in office and told her that this was in the works but has not started in office yet.   She is asking to continue with referral to [facility].   Pended this referral

## 2024-06-03 ENCOUNTER — Encounter: Payer: Self-pay | Admitting: Rehabilitative and Restorative Service Providers"

## 2024-06-03 ENCOUNTER — Ambulatory Visit: Admitting: Rehabilitative and Restorative Service Providers"

## 2024-06-03 DIAGNOSIS — R293 Abnormal posture: Secondary | ICD-10-CM

## 2024-06-03 DIAGNOSIS — R29898 Other symptoms and signs involving the musculoskeletal system: Secondary | ICD-10-CM

## 2024-06-03 DIAGNOSIS — M6281 Muscle weakness (generalized): Secondary | ICD-10-CM

## 2024-06-03 DIAGNOSIS — M25512 Pain in left shoulder: Secondary | ICD-10-CM

## 2024-06-03 DIAGNOSIS — M25511 Pain in right shoulder: Secondary | ICD-10-CM

## 2024-06-03 DIAGNOSIS — M7501 Adhesive capsulitis of right shoulder: Secondary | ICD-10-CM | POA: Diagnosis not present

## 2024-06-03 NOTE — Telephone Encounter (Signed)
 The patient has been updated of the Ortho referral. She is aware to contact the clinic if she does not hear back for scheduling an appointment.

## 2024-06-03 NOTE — Telephone Encounter (Signed)
 Orders Placed This Encounter  Procedures   AMB referral to orthopedics    Referral Priority:   Routine    Referral Type:   Consultation    Referred to Provider:   Inocencio Agent, MD    Requested Specialty:   Orthopedic Surgery    Number of Visits Requested:   1

## 2024-06-03 NOTE — Therapy (Signed)
 " OUTPATIENT PHYSICAL THERAPY SHOULDER TREATMENT  Patient Name: Victoria Benjamin MRN: [id number] DOB:10/18/1945, 79 y.o., female Today's Date: 06/03/2024  END OF SESSION:  PT End of Session - 06/03/24 0849     Visit Number 2    Number of Visits 16    Date for Recertification  07/26/24    Authorization Type [employer] medicare auth required $20 copay    Progress Note Due on Visit 10    PT Start Time [phone]    PT Stop Time 0930    PT Time Calculation (min) 41 min    Activity Tolerance Patient tolerated treatment well          Past Medical History:  Diagnosis Date   COPD (chronic obstructive pulmonary disease) (HCC)    Past Surgical History:  Procedure Laterality Date   ABDOMINAL HYSTERECTOMY     partial   BACK SURGERY     BREAST ENHANCEMENT SURGERY     BREAST EXCISIONAL BIOPSY Right    BREAST SURGERY     CAPSULOTOMY Right 08/01/2019   CATARACT EXTRACTION Right    HAND SURGERY  2006, 07   NECK SURGERY  2002   Patient Active Problem List   Diagnosis Date Noted   Chronic obstructive pulmonary disease (HCC) 11/15/2020   Polyarthralgia 08/16/2020   TMJ tenderness, left 08/16/2020   Gastroesophageal reflux disease with esophagitis without hemorrhage 07/25/2019   Elevated serum creatinine 07/25/2019   Combined forms of age-related cataract of left eye 05/20/2018   Regular astigmatism of left eye 05/20/2018   BPPV (benign paroxysmal positional vertigo) 03/03/2016   Left lumbar radiculopathy 11/07/2014   Primary osteoarthritis of both knees 06/20/2014   Osteoarthritis of left hip 06/20/2014   Osteopenia 06/19/2014   Left third MTP synovitis 12/15/2013   Metatarsalgia of right foot 12/15/2013   IFG (impaired fasting glucose) 05/11/2013   MENOPAUSAL SYNDROME 09/02/2007   Hyperlipidemia 09/23/2006   History of depression 09/23/2006    PCP: Dr Dorothyann JONETTA Byars REFERRING PROVIDER: Katrinka King, PA-C REFERRING DIAG: Adhesive capsulitis bilat shoulder   THERAPY DIAG:  Acute  pain of left shoulder  Acute pain of right shoulder  Other symptoms and signs involving the musculoskeletal system  Abnormal posture  Muscle weakness (generalized)  Rationale for Evaluation and Treatment: Rehabilitation  ONSET DATE: 11/10/23  SUBJECTIVE:                                                                                                                                                                                      SUBJECTIVE STATEMENT: The patient reports she got knee injections Tuesday-- it helped her shoulders with mobility and she is feeling better. She  notes a reduction in the stiffness. She also reports it's interesting because prior shoulder injections didn't give her relief. She has been doing more around her house since the injections. She reports that last session she was fatigued and had concerns about driving home. PT mentioned we can rest at end of session to ensure she feels safe to drive. The patient doesn't have a pulley at home so she is not doing those exercises.   EVAL: Patient reports that she had pain in both hands and wrists ~ 6 months ago. She had a shot in both hands and pain resolved. She noticed that she had pain in her shoulders as well as knees. She received shots in knees which helped the shoulders some. Then shoulders continued to hurt. She did receive injections in shoulders in both shoulder 04/01/24 with no improvement.   Hand dominance: Right  PERTINENT HISTORY: Arthritis bilat knees has received injections in knees with good results. First round of shots lasted ~ 6 years; history of COPD; elevated cholesterol   PAIN:  Are you having pain? Yes: NPRS scale: 0/10 today Pain location: bilat shoulders Pain description: sharp Aggravating factors: moving arms; reaching; lifting; unable to lie on side  Relieving factors: tylenol ; avoiding movement  PRECAUTIONS: None  WEIGHT BEARING RESTRICTIONS: No  FALLS:  Has patient fallen in last 6  months? 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 DIAGNOSTIC FINDINGS:  Xrays of shoulders per patient not available   PATIENT SURVEYS:  Quick DASH - 54.5/100; 54.5%  COGNITION: Overall cognitive status: Within functional limits for tasks assessed     SENSATION: WFL  POSTURE: Patient presents with head forward posture with increased thoracic kyphosis; shoulders rounded and elevated; scapulae abducted and rotated along the thoracic spine; head of the humerus anterior in orientation.  UPPER EXTREMITY ROM:  Active ROM Right eval Left eval Right 06/03/24 Left 06/03/24  Shoulder flexion 62 51 148 135  Shoulder extension 40 17    Shoulder abduction 64 45    Shoulder adduction      Shoulder internal rotation Hand lateral hip Hand to lower sacrum     Shoulder external rotation 18 16    Elbow flexion      Elbow extension      Wrist flexion      Wrist extension      Wrist ulnar deviation      Wrist radial deviation      Wrist pronation      Wrist supination      (Blank rows = not tested)  UPPER EXTREMITY MMT:   Functional strength in available small range - patient tolerates light resistance with MMT but has pain with resistance in all planes  MMT Right eval Left eval  Shoulder flexion    Shoulder extension    Shoulder abduction    Shoulder adduction    Shoulder internal rotation    Shoulder external rotation    Middle trapezius    Lower trapezius  Elbow flexion    Elbow extension    Wrist flexion    Wrist extension    Wrist ulnar deviation    Wrist radial deviation    Wrist pronation    Wrist supination    Grip strength (lbs)    (Blank rows = not tested)  JOINT MOBILITY TESTING:  Decreased joint mobility bilat UE's   PALPATION:  Muscular tightness pecs; upper trap; leveator; deltoid and biceps L > R    [facility] Adult PT Treatment:                                                DATE: 06/03/24 Therapeutic Exercise: Seated Scapular squeeze Supine Cane press x 12 reps Cane AAROM into ER with arms abducted to 30 degrees Horizontal shoulder abduction x 8 reps without resistance for chest/shoulder opening Therapeutic Activity: AAROM on physioball to warm up into flexion bilat AAROM on physioball (on elevated mat) into abduction R and L sides AAROM door frame flexion R and L x 8 reps each Supine PNF D2 pattern x 8 reps R side and 4 repsL side-- discontinued on L due to catching sensation Seated serratus engagement with yellow band and low range flexion bilaterally Self Care: The patient and PT discussed variable condition and recommended attempting new exercises -- use pain as a guide and only do to tolerance                                                                                                                             PATIENT EDUCATION: Education details:HEP Person educated: Patient Education method: Explanation, Demonstration, Tactile cues, Verbal cues, and Handouts Education comprehension: verbalized understanding, returned demonstration, verbal cues required, tactile cues required, and needs further education  HOME EXERCISE PROGRAM: Access Code: [email or web address] URL: [email or web address] Date: 05/31/2024 Prepared by: Celyn Holt  Exercises - Circular Shoulder Pendulum with Table Support  - 3-4 x daily - 7 x weekly - 1 sets - 20-30 reps - Seated Shoulder Flexion Towel Slide at Table Top Full Range of  Motion  - 2 x daily - 7 x weekly - 1 sets - 5-10 reps - 10sec  hold - Seated Shoulder Flexion AAROM with Pulley Behind  - 2 x daily - 7 x weekly - 1 sets - 10 reps - 10 sec  hold - Seated Shoulder Scaption AAROM with Pulley at Side  - 2 x daily - 7 x weekly - 1 sets - 10 reps - 10sec  hold - Seated Scapular Retraction  - 2 x daily - 7 x weekly - 1-2 sets - 10 reps - 10 sec  hold  ASSESSMENT:  CLINICAL IMPRESSION: The patient arrived today with different clinical presentation. She had knee injections that she feels helped her shoulders. She had significant improvement in her ROM today. She is reporting  using some blue bands at home for exercise and did not do pulley exercises from initial HEP due to not having pulleys--PT made small adjustments to current HEP with education to modify to tolerance if her condition changes (as her mobility is different as compared to Monday's eval). PT to continue working towards STG/LTGs.   EVAL: Patient is a 79 y.o. female who was seen today for physical therapy evaluation and treatment for bilat adhesive capsulitis. Signs and symptoms are consistent with diagnosis including pain with active movement; limited ROM/mobility; decreased strength and functional activity tolerance bilat shoulders/UEs L > R. Patient will benefit from PT to address problems identified   OBJECTIVE IMPAIRMENTS: decreased activity tolerance, decreased mobility, decreased ROM, decreased strength, increased fascial restrictions, impaired flexibility, impaired UE functional use, improper body mechanics, postural dysfunction, and pain.   GOALS: Goals reviewed with patient? Yes  SHORT TERM GOALS: Target date: 06/28/2024  Decrease pain bilat shoulders by 25-30% allowing patient to participate in exercises and functional activities  Baseline: Goal status: INITIAL  2.  Increase AROM bilat shoulders in elevation by 10 degrees  Baseline:  Goal status: INITIAL  3.  Patient reports ability to  sleep for longer periods of time (4 hours) without awakening due to shoulder pain  Baseline:  Goal status: INITIAL  LONG TERM GOALS: Target date: 07/26/2024  Decrease bilat shoulder pain by 75% allowing patient to participate in more functional activities  Baseline:  Goal status: INITIAL  2.  Patient demonstrates increased strength bilat UE's allowing movement through the available range to reach into kitchen cabinets  Baseline:  Goal status: INITIAL  3.  Increase AROM bilat shoulders to WFL's allowing patient to reach overhead and wash hair, etc  Baseline:  Goal status: INITIAL  4.  Improve Quick Dash score by 10% indicating increased functional activity level  Baseline: 54.5/100; 54.5%  Goal status: INITIAL  5.  Independent in advanced HEP including aquatic program as indicated  Baseline:  Goal status: INITIAL  PLAN:  PT FREQUENCY: 1-2x/week  PT DURATION: 8 weeks  PLANNED INTERVENTIONS: 97164- PT Re-evaluation, 97110-Therapeutic exercises, 97530- Therapeutic activity, 97112- Neuromuscular re-education, 97535- Self Care, 02859- Manual therapy, [phone]- Aquatic Therapy, [phone]- Ionotophoresis 4mg /ml Dexamethasone , Patient/Family education, Taping, and Joint mobilization  PLAN FOR NEXT SESSION: review and progress exercises; continue with education re-pathology and progression of exercises; ergonomic suggestions; manual work and modalities as indicated *Patient condition varies--- much improved as compared to eval---unsure if temporary from injections? Plan to modify to tolerance.    Billiejo Sorto, PT 06/03/2024, 8:51 AM  "

## 2024-06-07 ENCOUNTER — Encounter: Payer: Self-pay | Admitting: Physical Therapy

## 2024-06-07 ENCOUNTER — Ambulatory Visit: Admitting: Physical Therapy

## 2024-06-07 DIAGNOSIS — R29898 Other symptoms and signs involving the musculoskeletal system: Secondary | ICD-10-CM

## 2024-06-07 DIAGNOSIS — M25512 Pain in left shoulder: Secondary | ICD-10-CM

## 2024-06-07 DIAGNOSIS — M6281 Muscle weakness (generalized): Secondary | ICD-10-CM

## 2024-06-07 DIAGNOSIS — R293 Abnormal posture: Secondary | ICD-10-CM

## 2024-06-07 DIAGNOSIS — M7501 Adhesive capsulitis of right shoulder: Secondary | ICD-10-CM | POA: Diagnosis not present

## 2024-06-07 DIAGNOSIS — M25511 Pain in right shoulder: Secondary | ICD-10-CM

## 2024-06-07 NOTE — Therapy (Signed)
 " OUTPATIENT PHYSICAL THERAPY TREATMENT  Patient Name: DARYN PISANI MRN: [id number] DOB:Jan 26, 1946, 79 y.o., female Today's Date: 06/07/2024  END OF SESSION:  PT End of Session - 06/07/24 1055     Visit Number 3    Number of Visits 16    Date for Recertification  07/26/24    Authorization Type [employer] medicare auth required $20 copay    Authorization Time Period auth tbd    Progress Note Due on Visit 10    PT Start Time 1057    PT Stop Time 1137    PT Time Calculation (min) 40 min           Past Medical History:  Diagnosis Date   COPD (chronic obstructive pulmonary disease) (HCC)    Past Surgical History:  Procedure Laterality Date   ABDOMINAL HYSTERECTOMY     partial   BACK SURGERY     BREAST ENHANCEMENT SURGERY     BREAST EXCISIONAL BIOPSY Right    BREAST SURGERY     CAPSULOTOMY Right 08/01/2019   CATARACT EXTRACTION Right    HAND SURGERY  2006, 07   NECK SURGERY  2002   Patient Active Problem List   Diagnosis Date Noted   Chronic obstructive pulmonary disease (HCC) 11/15/2020   Polyarthralgia 08/16/2020   TMJ tenderness, left 08/16/2020   Gastroesophageal reflux disease with esophagitis without hemorrhage 07/25/2019   Elevated serum creatinine 07/25/2019   Combined forms of age-related cataract of left eye 05/20/2018   Regular astigmatism of left eye 05/20/2018   BPPV (benign paroxysmal positional vertigo) 03/03/2016   Left lumbar radiculopathy 11/07/2014   Primary osteoarthritis of both knees 06/20/2014   Osteoarthritis of left hip 06/20/2014   Osteopenia 06/19/2014   Left third MTP synovitis 12/15/2013   Metatarsalgia of right foot 12/15/2013   IFG (impaired fasting glucose) 05/11/2013   MENOPAUSAL SYNDROME 09/02/2007   Hyperlipidemia 09/23/2006   History of depression 09/23/2006    PCP: Dr Dorothyann JONETTA Byars REFERRING PROVIDER: Katrinka King, PA-C REFERRING DIAG: Adhesive capsulitis bilat shoulder   THERAPY DIAG:  Acute pain of left  shoulder  Acute pain of right shoulder  Other symptoms and signs involving the musculoskeletal system  Abnormal posture  Muscle weakness (generalized)  Rationale for Evaluation and Treatment: Rehabilitation  ONSET DATE: 11/10/23  SUBJECTIVE:                                                                                                                                                                                      SUBJECTIVE STATEMENT: 06/07/2024: Pt states she was feeling much better, still feeling better but seems to be wearing off a bit. L>R feeling  some soreness/achiness. Felt a little transient soreness after last session, HEP going well at home but feels a bit of lateral shoulder soreness BIL.   EVAL: Patient reports that she had pain in both hands and wrists ~ 6 months ago. She had a shot in both hands and pain resolved. She noticed that she had pain in her shoulders as well as knees. She received shots in knees which helped the shoulders some. Then shoulders continued to hurt. She did receive injections in shoulders in both shoulder 04/01/24 with no improvement.   Hand dominance: Right  PERTINENT HISTORY: Arthritis bilat knees has received injections in knees with good results. First round of shots lasted ~ 6 years; history of COPD; elevated cholesterol   PAIN:  Are you having pain? Yes: NPRS scale: 1/10 BIL shoulder Pain location: bilat shoulders Pain description: sharp Aggravating factors: moving arms; reaching; lifting; unable to lie on side  Relieving factors: tylenol ; avoiding movement  PRECAUTIONS: None  WEIGHT BEARING RESTRICTIONS: No  FALLS:  Has patient fallen in last 6 months? 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 DIAGNOSTIC FINDINGS:  Xrays of shoulders per patient not available   PATIENT SURVEYS:  Quick DASH - 54.5/100; 54.5%  COGNITION: Overall cognitive status: Within functional limits for tasks assessed     SENSATION: WFL  POSTURE: Patient presents with head forward posture with increased thoracic kyphosis; shoulders rounded and elevated; scapulae abducted and rotated along the thoracic spine; head of the humerus anterior in orientation.  UPPER EXTREMITY ROM:  Active ROM Right eval Left eval Right 06/03/24 Left 06/03/24 R/L 06/07/24  Shoulder flexion 62 51 148 135 145/129  Shoulder extension 40 17     Shoulder abduction 64 45   100 deg / 95 deg  Shoulder adduction       Shoulder internal rotation Hand lateral hip Hand to lower sacrum      Shoulder external rotation 18 16   41 deg / 28 deg  Elbow flexion       Elbow extension       Wrist flexion       Wrist extension       Wrist ulnar deviation       Wrist radial deviation       Wrist pronation       Wrist supination       (Blank rows = not tested)  UPPER EXTREMITY MMT:   Functional strength in available small range - patient tolerates light resistance with MMT but has pain with resistance in all planes  MMT Right eval Left eval  Shoulder flexion    Shoulder extension    Shoulder abduction    Shoulder adduction    Shoulder internal rotation    Shoulder external rotation    Middle trapezius    Lower trapezius    Elbow flexion    Elbow extension    Wrist flexion    Wrist extension    Wrist ulnar deviation  Wrist radial deviation    Wrist pronation    Wrist supination    Grip strength (lbs)    (Blank rows = not  tested)  JOINT MOBILITY TESTING:  Decreased joint mobility bilat UE's   PALPATION:  Muscular tightness pecs; upper trap; leveator; deltoid and biceps L > R     [facility] Adult PT Treatment:                                                DATE: 06/07/24 Therapeutic Exercise: Standing swiss ball BIL shoulder flexion x12 Standing swiss ball shoulder abd x12 BIL cues for comfortable ROM  Red band row x12 Supine cane flexion AAROM x10  Supine shoulder protraction w/ cane x15  Shoulder ER ROM w/ towel and doorframe x5 BIL w/ gentle hold HEP update + education/handout  Therapeutic Activity: Finger ladder flexion x8 BIL (R to 24, L 24) 1# shuffle into first cabinet x6 BIL (scaption position)    [facility] Adult PT Treatment:                                                DATE: 06/03/24 Therapeutic Exercise: Seated Scapular squeeze Supine Cane press x 12 reps Cane AAROM into ER with arms abducted to 30 degrees Horizontal shoulder abduction x 8 reps without resistance for chest/shoulder opening Therapeutic Activity: AAROM on physioball to warm up into flexion bilat AAROM on physioball (on elevated mat) into abduction R and L sides AAROM door frame flexion R and L x 8 reps each Supine PNF D2 pattern x 8 reps R side and 4 repsL side-- discontinued on L due to catching sensation Seated serratus engagement with yellow band and low range flexion bilaterally Self Care: The patient and PT discussed variable condition and recommended attempting new exercises -- use pain as a guide and only do to tolerance                                                                                                                             PATIENT EDUCATION: Education details:HEP Person educated: Patient Education method: Explanation, Demonstration, Tactile cues, Verbal cues, and Handouts Education comprehension: verbalized understanding, returned demonstration, verbal cues required, tactile cues required, and  needs further education  HOME EXERCISE PROGRAM: Access Code: [email or web address] URL: [email or web address] Date: 06/07/2024 Prepared by: Alm Jenny  Exercises - Circular Shoulder Pendulum with Table Support  - 3-4 x daily - 7 x weekly - 1 sets - 20-30 reps - Seated Shoulder Flexion Towel Slide at Table Top Full Range of Motion  - 2 x daily - 7 x weekly - 1 sets - 5-10 reps - 10sec  hold - Shoulder Flexion Serratus Activation with  Resistance  - 1 x daily - 7 x weekly - 1-2 sets - 5 reps - Standing Single Arm Shoulder Flexion Stretch on Wall  - 1 x daily - 7 x weekly - 2 sets - 5 reps - Standing Shoulder Row with Anchored Resistance  - 1 x daily - 7 x weekly - 2 sets - 6-8 reps  ASSESSMENT:  CLINICAL IMPRESSION: 06/07/2024: Pt arrives w/ 1/10 pain, no issues after last session. ROM measurements for flexion are comparable to last session, abduction/ER remain limited but significantly improved compared to initial eval. Symptom irritability remains improved compared to eval, although pt continues to voice some apprehension re: possibility of symptoms returning. Given this we continue to focus on comfortable AAROM and light strengthening which she tolerates well overall. States today feels comparable to last session, denies increase in resting pain on departure, no adverse events. HEP update as above, did discuss with pt if continues along current trajectory could consider reduction in frequency to 1x/week in coming visits per her preference. Recommend continuing along current POC in order to address relevant deficits and improve functional tolerance. Pt departs today's session in no acute distress, all voiced questions/concerns addressed appropriately from PT perspective.     EVAL: Patient is a 79 y.o. female who was seen today for physical therapy evaluation and treatment for bilat adhesive capsulitis. Signs and symptoms are consistent with diagnosis including pain with active movement;  limited ROM/mobility; decreased strength and functional activity tolerance bilat shoulders/UEs L > R. Patient will benefit from PT to address problems identified   OBJECTIVE IMPAIRMENTS: decreased activity tolerance, decreased mobility, decreased ROM, decreased strength, increased fascial restrictions, impaired flexibility, impaired UE functional use, improper body mechanics, postural dysfunction, and pain.   GOALS: Goals reviewed with patient? Yes  SHORT TERM GOALS: Target date: 06/28/2024  Decrease pain bilat shoulders by 25-30% allowing patient to participate in exercises and functional activities  Baseline: Goal status: INITIAL  2.  Increase AROM bilat shoulders in elevation by 10 degrees  Baseline:  Goal status: INITIAL  3.  Patient reports ability to sleep for longer periods of time (4 hours) without awakening due to shoulder pain  Baseline:  Goal status: INITIAL  LONG TERM GOALS: Target date: 07/26/2024  Decrease bilat shoulder pain by 75% allowing patient to participate in more functional activities  Baseline:  Goal status: INITIAL  2.  Patient demonstrates increased strength bilat UE's allowing movement through the available range to reach into kitchen cabinets  Baseline:  Goal status: INITIAL  3.  Increase AROM bilat shoulders to WFL's allowing patient to reach overhead and wash hair, etc  Baseline:  Goal status: INITIAL  4.  Improve Quick Dash score by 10% indicating increased functional activity level  Baseline: 54.5/100; 54.5%  Goal status: INITIAL  5.  Independent in advanced HEP including aquatic program as indicated  Baseline:  Goal status: INITIAL  PLAN:  PT FREQUENCY: 1-2x/week  PT DURATION: 8 weeks  PLANNED INTERVENTIONS: 97164- PT Re-evaluation, 97110-Therapeutic exercises, 97530- Therapeutic activity, [id number]- Neuromuscular re-education, 97535- Self Care, 02859- Manual therapy, [id number]- Aquatic Therapy, [phone]- Ionotophoresis 4mg /ml Dexamethasone ,  Patient/Family education, Taping, and Joint mobilization  PLAN FOR NEXT SESSION: review and progress exercises;  AAROM/AROM as tolerated, progress light strengthening as able/appropriate. Monitor symptom trajectory. If continues to remain improved could consider reduction in frequency over next couple of weeks per pt preference   Alm DELENA Jenny PT, DPT 06/07/2024 11:47 AM  "

## 2024-06-10 ENCOUNTER — Ambulatory Visit (INDEPENDENT_AMBULATORY_CARE_PROVIDER_SITE_OTHER)

## 2024-06-10 DIAGNOSIS — M25511 Pain in right shoulder: Secondary | ICD-10-CM

## 2024-06-10 DIAGNOSIS — M7501 Adhesive capsulitis of right shoulder: Secondary | ICD-10-CM | POA: Diagnosis not present

## 2024-06-10 DIAGNOSIS — M6281 Muscle weakness (generalized): Secondary | ICD-10-CM

## 2024-06-10 DIAGNOSIS — R29898 Other symptoms and signs involving the musculoskeletal system: Secondary | ICD-10-CM

## 2024-06-10 DIAGNOSIS — R293 Abnormal posture: Secondary | ICD-10-CM

## 2024-06-10 DIAGNOSIS — M25512 Pain in left shoulder: Secondary | ICD-10-CM

## 2024-06-10 NOTE — Therapy (Signed)
 " OUTPATIENT PHYSICAL THERAPY TREATMENT  Patient Name: Victoria Benjamin MRN: [id number] DOB:1945/05/13, 79 y.o., female Today's Date: 06/10/2024  END OF SESSION:  PT End of Session - 06/10/24 0802     Visit Number 4    Number of Visits 16    Date for Recertification  07/26/24    Authorization Type [employer] medicare auth required $20 copay    Authorization Time Period 1/20-3/17/26    Authorization - Visit Number 4    Authorization - Number of Visits 16    Progress Note Due on Visit 10    PT Start Time 0802    PT Stop Time 0842    PT Time Calculation (min) 40 min            Past Medical History:  Diagnosis Date   COPD (chronic obstructive pulmonary disease) (HCC)    Past Surgical History:  Procedure Laterality Date   ABDOMINAL HYSTERECTOMY     partial   BACK SURGERY     BREAST ENHANCEMENT SURGERY     BREAST EXCISIONAL BIOPSY Right    BREAST SURGERY     CAPSULOTOMY Right 08/01/2019   CATARACT EXTRACTION Right    HAND SURGERY  2006, 07   NECK SURGERY  2002   Patient Active Problem List   Diagnosis Date Noted   Chronic obstructive pulmonary disease (HCC) 11/15/2020   Polyarthralgia 08/16/2020   TMJ tenderness, left 08/16/2020   Gastroesophageal reflux disease with esophagitis without hemorrhage 07/25/2019   Elevated serum creatinine 07/25/2019   Combined forms of age-related cataract of left eye 05/20/2018   Regular astigmatism of left eye 05/20/2018   BPPV (benign paroxysmal positional vertigo) 03/03/2016   Left lumbar radiculopathy 11/07/2014   Primary osteoarthritis of both knees 06/20/2014   Osteoarthritis of left hip 06/20/2014   Osteopenia 06/19/2014   Left third MTP synovitis 12/15/2013   Metatarsalgia of right foot 12/15/2013   IFG (impaired fasting glucose) 05/11/2013   MENOPAUSAL SYNDROME 09/02/2007   Hyperlipidemia 09/23/2006   History of depression 09/23/2006    PCP: Dr Dorothyann JONETTA Byars REFERRING PROVIDER: Katrinka King, PA-C REFERRING DIAG:  Adhesive capsulitis bilat shoulder   THERAPY DIAG:  Acute pain of left shoulder  Acute pain of right shoulder  Other symptoms and signs involving the musculoskeletal system  Abnormal posture  Muscle weakness (generalized)  Rationale for Evaluation and Treatment: Rehabilitation  ONSET DATE: 11/10/23  SUBJECTIVE:                                                                                                                                                                                      SUBJECTIVE STATEMENT: Patient reports the shoulder  pain is starting to come back. She has been working on her HEP.   EVAL: Patient reports that she had pain in both hands and wrists ~ 6 months ago. She had a shot in both hands and pain resolved. She noticed that she had pain in her shoulders as well as knees. She received shots in knees which helped the shoulders some. Then shoulders continued to hurt. She did receive injections in shoulders in both shoulder 04/01/24 with no improvement.   Hand dominance: Right  PERTINENT HISTORY: Arthritis bilat knees has received injections in knees with good results. First round of shots lasted ~ 6 years; history of COPD; elevated cholesterol   PAIN:  Are you having pain? Yes: NPRS scale: 4/10 BIL shoulder (Lt>Rt)  Pain location: bilat shoulders Pain description: sharp Aggravating factors: moving arms; reaching; lifting; unable to lie on side  Relieving factors: tylenol ; avoiding movement  PRECAUTIONS: None  WEIGHT BEARING RESTRICTIONS: No  FALLS:  Has patient fallen in last 6 months? 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 DIAGNOSTIC FINDINGS:  Xrays of shoulders per patient not available   PATIENT SURVEYS:  Quick DASH - 54.5/100; 54.5%  COGNITION: Overall cognitive status: Within functional limits for tasks assessed     SENSATION: WFL  POSTURE: Patient presents with head forward posture with increased thoracic kyphosis; shoulders rounded and elevated; scapulae abducted and rotated along the thoracic spine; head of the humerus anterior in orientation.  UPPER EXTREMITY ROM:  Active ROM Right eval Left eval Right 06/03/24 Left 06/03/24 R/L 06/07/24  Shoulder flexion 62 51 148 135 145/129  Shoulder extension 40 17     Shoulder abduction 64 45   100 deg / 95 deg  Shoulder adduction       Shoulder internal rotation Hand lateral hip Hand to lower sacrum      Shoulder external rotation 18 16   41 deg / 28 deg  Elbow flexion       Elbow extension       Wrist flexion       Wrist extension       Wrist ulnar deviation       Wrist radial deviation       Wrist pronation       Wrist supination       (Blank rows = not tested)  UPPER EXTREMITY MMT:   Functional strength in available small range - patient tolerates light resistance with MMT but has pain with resistance in all planes  MMT Right eval Left eval  Shoulder flexion    Shoulder extension    Shoulder abduction    Shoulder adduction    Shoulder internal rotation    Shoulder external rotation    Middle trapezius    Lower trapezius    Elbow flexion    Elbow extension    Wrist flexion    Wrist extension    Wrist ulnar deviation    Wrist radial deviation  Wrist pronation    Wrist supination    Grip strength (lbs)    (Blank rows = not tested)  JOINT MOBILITY TESTING:  Decreased joint mobility bilat UE's   PALPATION:  Muscular tightness  pecs; upper trap; leveator; deltoid and biceps L > R    [facility] Adult PT Treatment:                                                DATE: 06/10/24 Therapeutic Exercise: Chest press with dowel x 10  HEP update   Neuromuscular re-ed: ER isometrics x 10 each; 5 sec hold IR isometrics x 10 each; 5 sec hold  Abduction isometrics x 10 each; 5 sec hold  Therapeutic Activity: Supine shoulder flexion AAROM with dowel x 10  Seated shoulder flexion towel slides x 10  Seated shoulder abduction towel slides x 10  Standing shoulder ER AAROM with dowel x 10    [facility] Adult PT Treatment:                                                DATE: 06/07/24 Therapeutic Exercise: Standing swiss ball BIL shoulder flexion x12 Standing swiss ball shoulder abd x12 BIL cues for comfortable ROM  Red band row x12 Supine cane flexion AAROM x10  Supine shoulder protraction w/ cane x15  Shoulder ER ROM w/ towel and doorframe x5 BIL w/ gentle hold HEP update + education/handout  Therapeutic Activity: Finger ladder flexion x8 BIL (R to 24, L 24) 1# shuffle into first cabinet x6 BIL (scaption position)    [facility] Adult PT Treatment:                                                DATE: 06/03/24 Therapeutic Exercise: Seated Scapular squeeze Supine Cane press x 12 reps Cane AAROM into ER with arms abducted to 30 degrees Horizontal shoulder abduction x 8 reps without resistance for chest/shoulder opening Therapeutic Activity: AAROM on physioball to warm up into flexion bilat AAROM on physioball (on elevated mat) into abduction R and L sides AAROM door frame flexion R and L x 8 reps each Supine PNF D2 pattern x 8 reps R side and 4 repsL side-- discontinued on L due to catching sensation Seated serratus engagement with yellow band and low range flexion bilaterally Self Care: The patient and PT discussed variable condition and recommended attempting new exercises -- use pain as a guide and only do to tolerance  PATIENT EDUCATION: Education details:HEP update  Person educated: Patient Education method: Explanation, Demonstration, Tactile cues, Verbal cues, and Handouts Education comprehension: verbalized understanding, returned demonstration, verbal cues required, tactile cues required, and needs further education  HOME EXERCISE PROGRAM: Access Code: [email or web address] URL: [email or web address] Date: 06/10/2024 Prepared by: Lucie Meeter  Exercises - Circular Shoulder Pendulum with Table Support  - 3-4 x daily - 7 x weekly - 1 sets - 20-30 reps - Standing Single Arm Shoulder Flexion Stretch on Wall  - 1 x daily - 7 x weekly - 2 sets - 5 reps - Standing Shoulder Row with Anchored Resistance  - 1 x daily - 7 x weekly - 2 sets - 6-8 reps - Seated Bilateral Shoulder Flexion Towel Slide at Table Top  - 2 x daily - 7 x weekly - 1 sets - 10 reps - Seated Shoulder Abduction Towel Slide at Table Top  - 2 x daily - 7 x weekly - 1 sets - 10 reps - Seated Shoulder External Rotation AAROM with Dowel  - 2 x daily - 7 x weekly - 1 sets - 10 reps - Standing Isometric Shoulder External Rotation with Doorway and Towel Roll  - 1 x daily - 7 x weekly - 2 sets - 10 reps - 5 sec  hold - Standing Isometric Shoulder Abduction with Doorway - Arm Bent  - 1 x daily - 7 x weekly - 2 sets - 10 reps - 5 sec  hold  ASSESSMENT:  CLINICAL IMPRESSION: Patient reports that pain is beginning to return in bilateral shoulders. Focused on progression of AAROM and introduced shoulder isometrics with fairly good tolerance. AAROM is limited secondary to pain. With shoulder isometrics this does not cause pain, but she does fatigue with these exercises.    EVAL: Patient is a 79 y.o. female who was seen today for physical therapy evaluation and treatment for bilat adhesive capsulitis. Signs and symptoms are consistent with  diagnosis including pain with active movement; limited ROM/mobility; decreased strength and functional activity tolerance bilat shoulders/UEs L > R. Patient will benefit from PT to address problems identified   OBJECTIVE IMPAIRMENTS: decreased activity tolerance, decreased mobility, decreased ROM, decreased strength, increased fascial restrictions, impaired flexibility, impaired UE functional use, improper body mechanics, postural dysfunction, and pain.   GOALS: Goals reviewed with patient? Yes  SHORT TERM GOALS: Target date: 06/28/2024  Decrease pain bilat shoulders by 25-30% allowing patient to participate in exercises and functional activities  Baseline: Goal status: INITIAL  2.  Increase AROM bilat shoulders in elevation by 10 degrees  Baseline:  Goal status: INITIAL  3.  Patient reports ability to sleep for longer periods of time (4 hours) without awakening due to shoulder pain  Baseline:  Goal status: INITIAL  LONG TERM GOALS: Target date: 07/26/2024  Decrease bilat shoulder pain by 75% allowing patient to participate in more functional activities  Baseline:  Goal status: INITIAL  2.  Patient demonstrates increased strength bilat UE's allowing movement through the available range to reach into kitchen cabinets  Baseline:  Goal status: INITIAL  3.  Increase AROM bilat shoulders to WFL's allowing patient to reach overhead and wash hair, etc  Baseline:  Goal status: INITIAL  4.  Improve Quick Dash score by 10% indicating increased functional activity level  Baseline: 54.5/100; 54.5%  Goal status: INITIAL  5.  Independent in advanced HEP including aquatic program as indicated  Baseline:  Goal status: INITIAL  PLAN:  PT FREQUENCY: 1-2x/week  PT DURATION: 8  weeks  PLANNED INTERVENTIONS: 97164- PT Re-evaluation, 97110-Therapeutic exercises, 97530- Therapeutic activity, [id number]- Neuromuscular re-education, 97535- Self Care, [id number]- Manual therapy, [phone]- Aquatic Therapy,  [phone]- Ionotophoresis 4mg /ml Dexamethasone , Patient/Family education, Taping, and Joint mobilization  PLAN FOR NEXT SESSION: review and progress exercises;  AAROM/AROM as tolerated, progress light strengthening as able/appropriate. Monitor symptom trajectory.   Tinnie Kunin, PT, DPT, ATC 06/10/24 8:42 AM  "

## 2024-06-13 ENCOUNTER — Ambulatory Visit: Admitting: Rehabilitative and Restorative Service Providers"

## 2024-06-15 ENCOUNTER — Encounter: Payer: Self-pay | Admitting: Rehabilitative and Restorative Service Providers"

## 2024-06-15 ENCOUNTER — Encounter: Payer: Self-pay | Admitting: Family Medicine

## 2024-06-15 ENCOUNTER — Ambulatory Visit (INDEPENDENT_AMBULATORY_CARE_PROVIDER_SITE_OTHER): Admitting: Family Medicine

## 2024-06-15 ENCOUNTER — Ambulatory Visit: Admitting: Rehabilitative and Restorative Service Providers"

## 2024-06-15 VITALS — BP 131/81 | HR 79 | Ht 62.0 in | Wt 148.0 lb

## 2024-06-15 DIAGNOSIS — R293 Abnormal posture: Secondary | ICD-10-CM

## 2024-06-15 DIAGNOSIS — M25511 Pain in right shoulder: Secondary | ICD-10-CM | POA: Diagnosis not present

## 2024-06-15 DIAGNOSIS — J449 Chronic obstructive pulmonary disease, unspecified: Secondary | ICD-10-CM | POA: Diagnosis not present

## 2024-06-15 DIAGNOSIS — M25619 Stiffness of unspecified shoulder, not elsewhere classified: Secondary | ICD-10-CM | POA: Diagnosis not present

## 2024-06-15 DIAGNOSIS — M2550 Pain in unspecified joint: Secondary | ICD-10-CM

## 2024-06-15 DIAGNOSIS — M25512 Pain in left shoulder: Secondary | ICD-10-CM

## 2024-06-15 DIAGNOSIS — M6281 Muscle weakness (generalized): Secondary | ICD-10-CM

## 2024-06-15 DIAGNOSIS — R29898 Other symptoms and signs involving the musculoskeletal system: Secondary | ICD-10-CM

## 2024-06-15 NOTE — Progress Notes (Signed)
 "  Established Patient Office Visit  Patient ID: Victoria Benjamin, female    DOB: 10-20-1945  Age: 79 y.o. MRN: [id number] PCP: Alvan Dorothyann BIRCH, MD  Chief Complaint  Patient presents with   Follow-up         Subjective:     HPI  Discussed the use of AI scribe software for clinical note transcription with the patient, who gave verbal consent to proceed.  History of Present Illness Victoria Benjamin is a 79 year old female who presents with bilateral shoulder and hand pain, and stiffness.  Bilateral shoulder pain and stiffness - Sudden onset of bilateral shoulder pain in the summer, initially attributed to overexertion during yard work - hjad injection in her knees and got relief in her shoulder for about 2 weeks. - Persistent pain despite corticosteroid injections in the shoulders - Significant limitation in shoulder mobility; unable to lift arms to retrieve items from the microwave without assisting one arm with the other  Bilateral hand pain and stiffness - History of severe bilateral hand pain and stiffness, previously preventing her from turning doorknobs - Corticosteroid injections in the hands have alleviated pain, with only occasional residual stiffness  Knee pain - Received corticosteroid injections in the knees four weeks ago, which provided temporary relief - Unable to continue knee injections every three months  Functional limitations and quality of life - Difficulty maintaining yard work due to pain and limited mobility - Lives alone and manages her own yard work - Concerned about ability to continue performing yard work independently  Pulmonary symptoms and medication use - Not using Spiriva  recently - Last pulmonary function test was two and a half years ago - Has albuterol  inhaler available for emergencies  Will look out for PT note later today once note is completed.      ROS    Objective:     BP 131/81   Pulse 79   Ht 5' 2 (1.575 m)    Wt 148 lb (67.1 kg)   SpO2 100%   BMI 27.07 kg/m    Physical Exam Vitals reviewed.  Constitutional:      Appearance: Normal appearance.  HENT:     Head: Normocephalic.  Pulmonary:     Effort: Pulmonary effort is normal.  Musculoskeletal:     Comments: Able to raise shoulder to 90 degrees.    Neurological:     Mental Status: She is alert and oriented to person, place, and time.  Psychiatric:        Mood and Affect: Mood normal.        Behavior: Behavior normal.      No results found for any visits on 06/15/24.    The 10-year ASCVD risk score (Arnett DK, et al., 2019) is: 22.3%    Assessment & Plan:   Problem List Items Addressed This Visit       Other   Polyarthralgia   Relevant Orders   TSH   CK (Creatine Kinase)   Sedimentation rate   C-reactive protein   ANA   Uric acid   CBC with Differential/Platelet   CYCLIC CITRUL PEPTIDE ANTIBODY, IGG/IGA   Rheumatoid factor   Other Visit Diagnoses       Acute pain of both shoulders    -  Primary   Relevant Orders   TSH   CK (Creatine Kinase)   Sedimentation rate   C-reactive protein   ANA   Uric acid   CBC with Differential/Platelet   CYCLIC  CITRUL PEPTIDE ANTIBODY, IGG/IGA   Rheumatoid factor     Decreased range of motion of shoulder, unspecified laterality       Relevant Orders   TSH   CK (Creatine Kinase)   Sedimentation rate   C-reactive protein   ANA   Uric acid   CBC with Differential/Platelet   CYCLIC CITRUL PEPTIDE ANTIBODY, IGG/IGA   Rheumatoid factor       Assessment and Plan Assessment & Plan Polyarticular joint pain under evaluation for inflammatory arthritis Chronic joint pain in shoulders, knees, and hands. Differential includes osteoarthritis, rheumatoid arthritis, lupus, gout, and polymyositis. Autoimmune arthritis considered due to multiple joint involvement. - Ordered rheumatoid arthritis panel, ANA, uric acid, and muscle enzyme levels. - Checked thyroid levels. - Continue  physical therapy. - Consider rheumatology referral based on lab results.  Chronic obstructive pulmonary disease COPD with last pulmonary function test in 2023. Not using Spiriva . Discussed importance of rescue inhaler and need for updated pulmonary function test. - Schedule pulmonary function test in March or April. - Ensure availability of albuterol  inhaler for rescue use.    Return in about 7 weeks (around 08/03/2024) for Spirometry .    Dorothyann Byars, MD [facility] at [facility]   "

## 2024-06-15 NOTE — Therapy (Signed)
 " OUTPATIENT PHYSICAL THERAPY TREATMENT  Patient Name: Victoria Benjamin MRN: [id number] DOB:1945-08-29, 79 y.o., female Today's Date: 06/15/2024  END OF SESSION:  PT End of Session - 06/15/24 0714     Visit Number 5    Number of Visits 16    Date for Recertification  07/26/24    Authorization Type [employer] medicare auth required $20 copay    Authorization - Visit Number 5    Authorization - Number of Visits 16    Progress Note Due on Visit 10    PT Start Time 0714    PT Stop Time 0800    PT Time Calculation (min) 46 min    Activity Tolerance Patient tolerated treatment well            Past Medical History:  Diagnosis Date   COPD (chronic obstructive pulmonary disease) (HCC)    Past Surgical History:  Procedure Laterality Date   ABDOMINAL HYSTERECTOMY     partial   BACK SURGERY     BREAST ENHANCEMENT SURGERY     BREAST EXCISIONAL BIOPSY Right    BREAST SURGERY     CAPSULOTOMY Right 08/01/2019   CATARACT EXTRACTION Right    HAND SURGERY  2006, 07   NECK SURGERY  2002   Patient Active Problem List   Diagnosis Date Noted   Chronic obstructive pulmonary disease (HCC) 11/15/2020   Polyarthralgia 08/16/2020   TMJ tenderness, left 08/16/2020   Gastroesophageal reflux disease with esophagitis without hemorrhage 07/25/2019   Elevated serum creatinine 07/25/2019   Combined forms of age-related cataract of left eye 05/20/2018   Regular astigmatism of left eye 05/20/2018   BPPV (benign paroxysmal positional vertigo) 03/03/2016   Left lumbar radiculopathy 11/07/2014   Primary osteoarthritis of both knees 06/20/2014   Osteoarthritis of left hip 06/20/2014   Osteopenia 06/19/2014   Left third MTP synovitis 12/15/2013   Metatarsalgia of right foot 12/15/2013   IFG (impaired fasting glucose) 05/11/2013   MENOPAUSAL SYNDROME 09/02/2007   Hyperlipidemia 09/23/2006   History of depression 09/23/2006    PCP: Dr Dorothyann JONETTA Byars REFERRING PROVIDER: Katrinka King,  PA-C REFERRING DIAG: Adhesive capsulitis bilat shoulder   THERAPY DIAG:  Acute pain of left shoulder  Acute pain of right shoulder  Other symptoms and signs involving the musculoskeletal system  Abnormal posture  Muscle weakness (generalized)  Rationale for Evaluation and Treatment: Rehabilitation  ONSET DATE: 11/10/23  SUBJECTIVE:                                                                                                                                                                                      SUBJECTIVE STATEMENT: Patient reports  that her shoulders are hurting again. She has had return of pain in both shoulders and has had some pain and stiffness in the her knees and hips.  She has been working on her HEP. Discussed possibility of seeing PCP for consult re- pain in multiple joints.   EVAL: Patient reports that she had pain in both hands and wrists ~ 6 months ago. She had a shot in both hands and pain resolved. She noticed that she had pain in her shoulders as well as knees. She received shots in knees which helped the shoulders some. Then shoulders continued to hurt. She did receive injections in shoulders in both shoulder 04/01/24 with no improvement.   Hand dominance: Right  PERTINENT HISTORY: Arthritis bilat knees has received injections in knees with good results. First round of shots lasted ~ 6 years; history of COPD; elevated cholesterol   PAIN:  Are you having pain? Yes: NPRS scale: 0/10 BIL shoulder at rest increased with use 8/10 (Lt>Rt) lasting during the movement Pain location: bilat shoulders Pain description: burning; hurts Aggravating factors: moving arms; reaching; lifting; unable to lie on side  Relieving factors: tylenol ; avoiding movement  PRECAUTIONS: None  WEIGHT BEARING RESTRICTIONS: No  FALLS:  Has patient fallen in last 6 months? 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 DIAGNOSTIC FINDINGS:  Xrays of shoulders per patient not available   PATIENT SURVEYS:  Quick DASH - 54.5/100; 54.5%  COGNITION: Overall cognitive status: Within functional limits for tasks assessed     SENSATION: WFL  POSTURE: Patient presents with head forward posture with increased thoracic kyphosis; shoulders rounded and elevated; scapulae abducted and rotated along the thoracic spine; head of the humerus anterior in orientation.  UPPER EXTREMITY ROM:  Active ROM Right eval Left eval Right 06/03/24 Left 06/03/24 R/L 06/07/24  Shoulder flexion 62 51 148 135 145/129  Shoulder extension 40 17     Shoulder abduction 64 45   100 deg / 95 deg  Shoulder adduction       Shoulder internal rotation Hand lateral hip Hand to lower sacrum      Shoulder external rotation 18 16   41 deg / 28 deg  Elbow flexion       Elbow extension       Wrist flexion       Wrist extension       Wrist ulnar deviation       Wrist radial deviation       Wrist pronation       Wrist supination       (Blank rows = not tested)  UPPER EXTREMITY MMT:   Functional strength in available small range - patient tolerates light resistance with MMT but has pain with resistance in all planes  MMT Right eval Left eval  Shoulder flexion    Shoulder extension    Shoulder abduction    Shoulder adduction    Shoulder internal rotation    Shoulder external rotation  Middle trapezius    Lower trapezius    Elbow flexion    Elbow extension    Wrist flexion    Wrist extension     Wrist ulnar deviation    Wrist radial deviation    Wrist pronation    Wrist supination    Grip strength (lbs)    (Blank rows = not tested)  JOINT MOBILITY TESTING:  Decreased joint mobility bilat UE's   PALPATION:  Muscular tightness pecs; upper trap; leveator; deltoid and biceps L > R    [facility] Adult PT Treatment:                                                DATE: 06/15/24 Therapeutic Exercise: Shoulder flexion rolling therapeutic ball on table Chest press with dowel x 10  Neuromuscular re-ed: ER isometrics x 10 each; 5 sec hold IR isometrics x 10 each; 5 sec hold  Abduction isometrics x 10 each; 5 sec hold  Therapeutic Activity: Supine shoulder flexion AAROM with dowel x 10  Seated shoulder flexion towel slides x 10  Seated shoulder abduction towel slides x 10  Standing shoulder ER AAROM with dowel x 10    [facility] Adult PT Treatment:                                                DATE: 06/10/24 Therapeutic Exercise: Chest press with dowel x 10  HEP update   Neuromuscular re-ed: ER isometrics x 10 each; 5 sec hold IR isometrics x 10 each; 5 sec hold  Abduction isometrics x 10 each; 5 sec hold  Therapeutic Activity: Supine shoulder flexion AAROM with dowel x 10  Seated shoulder flexion towel slides x 10  Seated shoulder abduction towel slides x 10  Standing shoulder ER AAROM with dowel x 10    [facility] Adult PT Treatment:                                                DATE: 06/07/24 Therapeutic Exercise: Standing swiss ball BIL shoulder flexion x12 Standing swiss ball shoulder abd x12 BIL cues for comfortable ROM  Red band row x12 Supine cane flexion AAROM x10  Supine shoulder protraction w/ cane x15  Shoulder ER ROM w/ towel and doorframe x5 BIL w/ gentle hold HEP update + education/handout  Therapeutic Activity: Finger ladder flexion x8 BIL (R to 24, L 24) 1# shuffle into first cabinet x6 BIL (scaption position)    [facility] Adult PT Treatment:                                                 DATE: 06/03/24 Therapeutic Exercise: Seated Scapular squeeze Supine Cane press x 12 reps Cane AAROM into ER with arms abducted to 30 degrees Horizontal shoulder abduction x 8 reps without resistance for chest/shoulder opening Therapeutic Activity: AAROM on physioball to warm up into flexion bilat AAROM on physioball (on elevated mat) into abduction R and L  sides AAROM door frame flexion R and L x 8 reps each Supine PNF D2 pattern x 8 reps R side and 4 repsL side-- discontinued on L due to catching sensation Seated serratus engagement with yellow band and low range flexion bilaterally Self Care: The patient and PT discussed variable condition and recommended attempting new exercises -- use pain as a guide and only do to tolerance                                                                                                                             PATIENT EDUCATION: Education details:HEP update  Person educated: Patient Education method: Explanation, Demonstration, Tactile cues, Verbal cues, and Handouts Education comprehension: verbalized understanding, returned demonstration, verbal cues required, tactile cues required, and needs further education  HOME EXERCISE PROGRAM: Access Code: [email or web address] URL: [email or web address] Date: 06/15/2024 Prepared by: Tasha Jindra  Exercises - Circular Shoulder Pendulum with Table Support  - 3-4 x daily - 7 x weekly - 1 sets - 20-30 reps - Standing Single Arm Shoulder Flexion Stretch on Wall  - 1 x daily - 7 x weekly - 2 sets - 5 reps - Seated Bilateral Shoulder Flexion Towel Slide at Table Top  - 2 x daily - 7 x weekly - 1 sets - 10 reps - Seated Shoulder Abduction Towel Slide at Table Top  - 2 x daily - 7 x weekly - 1 sets - 10 reps - Seated Shoulder External Rotation AAROM with Dowel  - 2 x daily - 7 x weekly - 1 sets - 10 reps - Isometric Shoulder External Rotation at Wall  - 1 x daily - 7 x weekly - 2 sets  - 10 reps - 5 sec  hold - Isometric Shoulder Abduction at Wall  - 1 x daily - 7 x weekly - 2 sets - 5-10 reps - 5 sec  hold - Standing Shoulder Row Reactive Isometric  - 1 x daily - 7 x weekly - 2 sets - 10 reps - 5 sec  hold  ASSESSMENT:  CLINICAL IMPRESSION: Treatment consists of AAROM as well as shoulder isometrics. Modified isometrics in standing to avoid shoulder or knee pain. Patient tolerated exercises well with no increase in pain. She continues to demonstrate fatigue with exercises. Patient reports increased pain in blat shoulders as well as some stiffness in her hips and knees. Persistent symptoms involving multiple joints suggest more systemic nature of current symptoms. Suggested patient see her PCP, Dr Alvan, for additional evaluation. AROM in bilat shoulders has improved but pain and limited ROM/function persist.   EVAL: Patient is a 79 y.o. female who was seen today for physical therapy evaluation and treatment for bilat adhesive capsulitis. Signs and symptoms are consistent with diagnosis including pain with active movement; limited ROM/mobility; decreased strength and functional activity tolerance bilat shoulders/UEs L > R. Patient will benefit from PT to address problems identified   OBJECTIVE IMPAIRMENTS: decreased activity tolerance, decreased mobility, decreased  ROM, decreased strength, increased fascial restrictions, impaired flexibility, impaired UE functional use, improper body mechanics, postural dysfunction, and pain.   GOALS: Goals reviewed with patient? Yes  SHORT TERM GOALS: Target date: 06/28/2024  Decrease pain bilat shoulders by 25-30% allowing patient to participate in exercises and functional activities  Baseline: Goal status: INITIAL  2.  Increase AROM bilat shoulders in elevation by 10 degrees  Baseline:  Goal status: INITIAL  3.  Patient reports ability to sleep for longer periods of time (4 hours) without awakening due to shoulder pain  Baseline:   Goal status: INITIAL  LONG TERM GOALS: Target date: 07/26/2024  Decrease bilat shoulder pain by 75% allowing patient to participate in more functional activities  Baseline:  Goal status: INITIAL  2.  Patient demonstrates increased strength bilat UE's allowing movement through the available range to reach into kitchen cabinets  Baseline:  Goal status: INITIAL  3.  Increase AROM bilat shoulders to WFL's allowing patient to reach overhead and wash hair, etc  Baseline:  Goal status: INITIAL  4.  Improve Quick Dash score by 10% indicating increased functional activity level  Baseline: 54.5/100; 54.5%  Goal status: INITIAL  5.  Independent in advanced HEP including aquatic program as indicated  Baseline:  Goal status: INITIAL  PLAN:  PT FREQUENCY: 1-2x/week  PT DURATION: 8 weeks  PLANNED INTERVENTIONS: 97164- PT Re-evaluation, 97110-Therapeutic exercises, 97530- Therapeutic activity, 97112- Neuromuscular re-education, 97535- Self Care, [id number]- Manual therapy, [phone]- Aquatic Therapy, [phone]- Ionotophoresis 4mg /ml Dexamethasone , Patient/Family education, Taping, and Joint mobilization  PLAN FOR NEXT SESSION: review and progress exercises;  AAROM/AROM as tolerated, progress light strengthening as able/appropriate. Monitor symptom trajectory.   Deloyce Walthers P. Ina PT, MPH 06/15/24 7:15 AM   "

## 2024-06-16 ENCOUNTER — Ambulatory Visit: Payer: Self-pay | Admitting: Family Medicine

## 2024-06-16 LAB — CBC WITH DIFFERENTIAL/PLATELET
Basophils Absolute: 0.1 10*3/uL (ref 0.0–0.2)
Basos: 1 %
EOS (ABSOLUTE): 0.2 10*3/uL (ref 0.0–0.4)
Eos: 2 %
Hematocrit: 42.1 % (ref 34.0–46.6)
Hemoglobin: 13.5 g/dL (ref 11.1–15.9)
Immature Grans (Abs): 0 10*3/uL (ref 0.0–0.1)
Immature Granulocytes: 0 %
Lymphocytes Absolute: 1.5 10*3/uL (ref 0.7–3.1)
Lymphs: 14 %
MCH: 29.9 pg (ref 26.6–33.0)
MCHC: 32.1 g/dL (ref 31.5–35.7)
MCV: 93 fL (ref 79–97)
Monocytes Absolute: 0.5 10*3/uL (ref 0.1–0.9)
Monocytes: 5 %
Neutrophils Absolute: 8.5 10*3/uL — ABNORMAL HIGH (ref 1.4–7.0)
Neutrophils: 78 %
Platelets: 268 10*3/uL (ref 150–450)
RBC: 4.52 x10E6/uL (ref 3.77–5.28)
RDW: 12.7 % (ref 11.7–15.4)
WBC: 10.7 10*3/uL (ref 3.4–10.8)

## 2024-06-16 LAB — ANA: Anti Nuclear Antibody (ANA): NEGATIVE

## 2024-06-16 LAB — URIC ACID: Uric Acid: 4.5 mg/dL (ref 3.1–7.9)

## 2024-06-16 LAB — SEDIMENTATION RATE: Sed Rate: 35 mm/h (ref 0–40)

## 2024-06-16 LAB — CK: Total CK: 43 U/L (ref 32–182)

## 2024-06-16 LAB — C-REACTIVE PROTEIN: CRP: 7 mg/L (ref 0–10)

## 2024-06-16 LAB — CYCLIC CITRUL PEPTIDE ANTIBODY, IGG/IGA

## 2024-06-16 LAB — RHEUMATOID FACTOR: Rheumatoid fact SerPl-aCnc: 10.2 [IU]/mL

## 2024-06-16 LAB — TSH: TSH: 2.54 u[IU]/mL (ref 0.450–4.500)

## 2024-06-16 NOTE — Progress Notes (Signed)
 Hi Laityn, blood count is normal.  No sign of anemia.  Thyroid level is perfect at 2.5.  Russell enzyme level is normal no sign of excess breakdown.  Inflammatory markers are also normal.  No sign of lupus.  Uric acid looks great so no sign of gout.  Rheumatoid factor is normal.  We still have 1 more test pending.

## 2024-06-20 ENCOUNTER — Ambulatory Visit: Admitting: Rehabilitative and Restorative Service Providers"

## 2024-06-22 ENCOUNTER — Ambulatory Visit: Admitting: Rehabilitative and Restorative Service Providers"

## 2024-06-27 ENCOUNTER — Ambulatory Visit: Admitting: Rehabilitative and Restorative Service Providers"

## 2024-06-29 ENCOUNTER — Ambulatory Visit: Admitting: Rehabilitative and Restorative Service Providers"

## 2024-09-05 ENCOUNTER — Ambulatory Visit: Admitting: Family Medicine

## 2024-11-22 ENCOUNTER — Ambulatory Visit
